# Patient Record
Sex: Male | Born: 1960 | Race: White | Hispanic: No | Marital: Married | Smoking: Current every day smoker
Health system: Southern US, Community
[De-identification: ages and names within clinical notes are randomized; demographics above are authoritative.]

## PROBLEM LIST (undated history)

## (undated) ENCOUNTER — Emergency Department (HOSPITAL_COMMUNITY): Admission: EM | Payer: Self-pay | Source: Home / Self Care

## (undated) DIAGNOSIS — R51 Headache: Secondary | ICD-10-CM

## (undated) DIAGNOSIS — R519 Headache, unspecified: Secondary | ICD-10-CM

## (undated) DIAGNOSIS — I509 Heart failure, unspecified: Secondary | ICD-10-CM

## (undated) DIAGNOSIS — I5031 Acute diastolic (congestive) heart failure: Secondary | ICD-10-CM

## (undated) DIAGNOSIS — I160 Hypertensive urgency: Secondary | ICD-10-CM

## (undated) DIAGNOSIS — M1990 Unspecified osteoarthritis, unspecified site: Secondary | ICD-10-CM

## (undated) DIAGNOSIS — I10 Essential (primary) hypertension: Secondary | ICD-10-CM

## (undated) DIAGNOSIS — I469 Cardiac arrest, cause unspecified: Secondary | ICD-10-CM

## (undated) HISTORY — DX: Hypertensive urgency: I16.0

## (undated) HISTORY — PX: NOSE SURGERY: SHX723

## (undated) HISTORY — DX: Cardiac arrest, cause unspecified: I46.9

## (undated) HISTORY — DX: Unspecified osteoarthritis, unspecified site: M19.90

## (undated) HISTORY — DX: Heart failure, unspecified: I50.9

## (undated) HISTORY — PX: BACK SURGERY: SHX140

## (undated) HISTORY — DX: Acute diastolic (congestive) heart failure: I50.31

---

## 2000-04-01 ENCOUNTER — Emergency Department (HOSPITAL_COMMUNITY): Admission: EM | Admit: 2000-04-01 | Discharge: 2000-04-01 | Payer: Self-pay | Admitting: Emergency Medicine

## 2004-01-03 ENCOUNTER — Emergency Department (HOSPITAL_COMMUNITY): Admission: EM | Admit: 2004-01-03 | Discharge: 2004-01-04 | Payer: Self-pay

## 2004-08-17 ENCOUNTER — Emergency Department (HOSPITAL_COMMUNITY): Admission: EM | Admit: 2004-08-17 | Discharge: 2004-08-18 | Payer: Self-pay | Admitting: *Deleted

## 2004-12-13 ENCOUNTER — Emergency Department (HOSPITAL_COMMUNITY): Admission: EM | Admit: 2004-12-13 | Discharge: 2004-12-13 | Payer: Self-pay | Admitting: Emergency Medicine

## 2012-10-01 ENCOUNTER — Other Ambulatory Visit: Payer: Self-pay | Admitting: Lab

## 2012-10-01 ENCOUNTER — Encounter: Payer: Self-pay | Admitting: Genetic Counselor

## 2012-11-22 ENCOUNTER — Ambulatory Visit (HOSPITAL_BASED_OUTPATIENT_CLINIC_OR_DEPARTMENT_OTHER): Payer: Self-pay | Admitting: Genetic Counselor

## 2012-11-22 ENCOUNTER — Other Ambulatory Visit: Payer: Self-pay | Admitting: Lab

## 2012-11-22 DIAGNOSIS — Z800 Family history of malignant neoplasm of digestive organs: Secondary | ICD-10-CM

## 2012-11-22 DIAGNOSIS — IMO0002 Reserved for concepts with insufficient information to code with codable children: Secondary | ICD-10-CM

## 2012-11-22 DIAGNOSIS — Z8041 Family history of malignant neoplasm of ovary: Secondary | ICD-10-CM

## 2012-11-23 ENCOUNTER — Encounter: Payer: Self-pay | Admitting: Genetic Counselor

## 2012-11-23 NOTE — Progress Notes (Signed)
Michael Bell, a 52 y.o. male, came in for discussion of his family history of colon, ovarian and stomach cancer.  He presents to clinic today with his sister, brother, father and nieces to discuss the possibility of a genetic predisposition to cancer, and to further clarify his risks, as well as his family members' risks for cancer.   HISTORY OF PRESENT ILLNESS: Michael Bell is a 52 y.o. male with no personal history of cancer.  He reports that he has not had a colonoscopy.  History reviewed. No pertinent past medical history.  History reviewed. No pertinent past surgical history.  History   Social History  . Marital Status: Single    Spouse Name: N/A    Number of Children: N/A  . Years of Education: N/A   Social History Main Topics  . Smoking status: Current Every Day Smoker    Types: Cigarettes  . Smokeless tobacco: None  . Alcohol Use: None  . Drug Use: None  . Sexual Activity: None   Other Topics Concern  . None   Social History Narrative  . None    PERSONAL RISK ASSESSMENT FACTORS: Colonoscopy: No  FAMILY HISTORY:  We obtained a detailed, 4-generation family history.  Significant diagnoses are listed below: Family History  Problem Relation Age of Onset  . Ovarian cancer Mother 66    primary peritoneal cancer  . Colon polyps Father     11 on last colonoscopy  . Colon cancer Sister 83    cancer of the sigmoid colon  . COPD Maternal Grandmother   . Stomach cancer Paternal Grandmother   . Down syndrome Paternal Aunt   . Cancer Paternal Uncle     cancer of the spine   Patient's ancestors are of unknown descent. There is no reported Ashkenazi Jewish ancestry. There is no known consanguinity.  GENETIC COUNSELING ASSESSMENT: Michael Bell is a 52 y.o. male with a familyhistory of colon, ovarian and stomach cancer which somewhat suggestive of a hereditary colon cancer syndrome and predisposition to cancer. We, therefore, discussed and recommended the following at  today's visit.   DISCUSSION: We reviewed the characteristics, features and inheritance patterns of hereditary cancer syndromes. We also discussed genetic testing, including the appropriate family members to test, the process of testing, insurance coverage and turn-around-time for results. We discussed that testing his sister would provide the best information for the family. Currently, the family history and ages of onset is most suggestive of Lynch Syndrome. However, his sister's tumor was MSS and IHC negative, suggesting that there is not a mutation in an MMR gene. Therefore, his sister's cancer could be the result of another cancer syndrome or possibly something that we cannot identify at this time. If his sister tests positive for a hereditary colon cancer syndrome, then we can easily test Michael Bell for this mutation. If his sister is negative, then we do not need to do testing at this time. We discussed his need for having a colonoscopy based primarily on his family history and age.   PLAN: After considering the risks, benefits, and limitations, Michael Bell declined testing until after his sister's test results are back. We discussed the implications of a positive, negative and/ or variant of uncertain significance genetic test result. We encouraged Michael Bell to remain in contact with cancer genetics annually so that we can continuously update the family history and inform him of any changes in cancer genetics and testing that may be of benefit for  his family. Michael Bell's questions were answered to his satisfaction today. Our contact information was provided should additional questions or concerns arise.  The patient was seen for a total of 60 minutes, greater than 50% of which was spent face-to-face counseling.  This plan is being carried out per Dr. Feliz Beam recommendations.  This note will also be sent to the referring provider via the electronic medical record. The patient will be supplied  with a summary of this genetic counseling discussion as well as educational information on the discussed hereditary cancer syndromes following the conclusion of their visit.   Patient was discussed with Dr. Drue Second.   _______________________________________________________________________ For Office Staff:  Number of people involved in session: 6 Was an Intern/ student involved with case: no

## 2013-07-19 ENCOUNTER — Ambulatory Visit (INDEPENDENT_AMBULATORY_CARE_PROVIDER_SITE_OTHER): Payer: No Typology Code available for payment source | Admitting: Family Medicine

## 2013-07-19 ENCOUNTER — Encounter: Payer: Self-pay | Admitting: Family Medicine

## 2013-07-19 ENCOUNTER — Ambulatory Visit: Payer: No Typology Code available for payment source

## 2013-07-19 VITALS — BP 165/91 | HR 74 | Temp 98.0°F | Resp 16 | Ht 71.5 in | Wt 241.0 lb

## 2013-07-19 DIAGNOSIS — M25559 Pain in unspecified hip: Secondary | ICD-10-CM

## 2013-07-19 DIAGNOSIS — Z789 Other specified health status: Secondary | ICD-10-CM

## 2013-07-19 DIAGNOSIS — I10 Essential (primary) hypertension: Secondary | ICD-10-CM

## 2013-07-19 DIAGNOSIS — M25551 Pain in right hip: Secondary | ICD-10-CM

## 2013-07-19 DIAGNOSIS — G8929 Other chronic pain: Secondary | ICD-10-CM

## 2013-07-19 DIAGNOSIS — Z9189 Other specified personal risk factors, not elsewhere classified: Secondary | ICD-10-CM

## 2013-07-19 LAB — COMPLETE METABOLIC PANEL WITH GFR
ALBUMIN: 3.9 g/dL (ref 3.5–5.2)
ALT: 24 U/L (ref 0–53)
AST: 21 U/L (ref 0–37)
Alkaline Phosphatase: 77 U/L (ref 39–117)
BILIRUBIN TOTAL: 0.4 mg/dL (ref 0.2–1.2)
BUN: 14 mg/dL (ref 6–23)
CO2: 25 mEq/L (ref 19–32)
Calcium: 9.1 mg/dL (ref 8.4–10.5)
Chloride: 107 mEq/L (ref 96–112)
Creat: 0.88 mg/dL (ref 0.50–1.35)
GFR, Est African American: >89 mL/min
GFR, Est Non African American: >89 mL/min
Glucose, Bld: 96 mg/dL (ref 70–99)
POTASSIUM: 4.3 meq/L (ref 3.5–5.3)
SODIUM: 140 meq/L (ref 135–145)
Total Protein: 7.1 g/dL (ref 6.0–8.3)

## 2013-07-19 MED ORDER — METOPROLOL SUCCINATE ER 50 MG PO TB24: ORAL_TABLET | ORAL | Status: DC

## 2013-07-19 MED ORDER — TRAMADOL HCL 50 MG PO TABS: 50.0000 mg | ORAL_TABLET | Freq: Three times a day (TID) | ORAL | Status: DC | PRN

## 2013-07-19 MED ORDER — GABAPENTIN 100 MG PO CAPS: 100.0000 mg | ORAL_CAPSULE | Freq: Three times a day (TID) | ORAL | Status: DC

## 2013-07-19 MED ORDER — AMLODIPINE BESYLATE 10 MG PO TABS: 10.0000 mg | ORAL_TABLET | Freq: Every day | ORAL | Status: DC

## 2013-07-19 MED ORDER — LISINOPRIL-HYDROCHLOROTHIAZIDE 20-12.5 MG PO TABS: ORAL_TABLET | ORAL | Status: DC
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
## 2013-07-19 NOTE — Patient Instructions (Signed)
PAIN MEDICATIONS: Tramadol can be taken 1 every 8 hours as needed for pain. It may make you sleepy.   GABAPENTIN: Start with 1 capsule at bedtime for 3 nights then increase to 2 capsules at bedtime for 1 week. Increase to 3 capsules at bedtime until next visit with me. The orthopedist may tweak this dosing a little.   Decrease caffeine intake; that will help your blood pressure and your bone health.

## 2013-07-20 LAB — THYROID PANEL WITH TSH
FREE THYROXINE INDEX: 2.7 (ref 1.0–3.9)
T3 Uptake: 28.5 % (ref 22.5–37.0)
T4 TOTAL: 9.6 ug/dL (ref 5.0–12.5)
TSH: 0.591 u[IU]/mL (ref 0.350–4.500)

## 2013-07-20 LAB — SEDIMENTATION RATE: SED RATE: 4 mm/h (ref 0–16)

## 2013-07-20 NOTE — Progress Notes (Signed)
Quick Note:  Please notify pt that results are normal.   Provide pt with copy of labs. ______ 

## 2013-07-22 ENCOUNTER — Telehealth: Payer: Self-pay

## 2013-07-22 ENCOUNTER — Encounter: Payer: Self-pay | Admitting: *Deleted

## 2013-07-22 DIAGNOSIS — Z720 Tobacco use: Secondary | ICD-10-CM | POA: Insufficient documentation

## 2013-07-22 DIAGNOSIS — G8929 Other chronic pain: Secondary | ICD-10-CM | POA: Insufficient documentation

## 2013-07-22 DIAGNOSIS — M25551 Pain in right hip: Secondary | ICD-10-CM

## 2013-07-22 DIAGNOSIS — I10 Essential (primary) hypertension: Secondary | ICD-10-CM | POA: Insufficient documentation

## 2013-07-22 MED ORDER — METOPROLOL SUCCINATE ER 50 MG PO TB24: ORAL_TABLET | ORAL | Status: DC

## 2013-07-22 NOTE — Telephone Encounter (Signed)
Fax from pharm advising that usual dose of metoprolol ER is just QD, ins will not pay for BID dosing of the ER. Dr Audria NineMcPherson, do you want to change to imm release metoprolol or to metoprolol dosed just once a day?

## 2013-07-22 NOTE — Telephone Encounter (Signed)
I phoned the pharmacy and changed frequency to Metoprolol succinate 50 mg  24 hr tablet   1 tablet daily. (Pt had a bottle from previous prescriber that had 1 tab bid on label). Notify pt of the change on that medication.

## 2013-07-23 NOTE — Telephone Encounter (Signed)
Spoke to patient advised him to take his metoprolol once a day per Dr. Audria NineMcPherson.  He said he would do so.

## 2013-10-07 ENCOUNTER — Emergency Department (HOSPITAL_COMMUNITY): Payer: No Typology Code available for payment source

## 2013-10-07 ENCOUNTER — Emergency Department (HOSPITAL_COMMUNITY)
Admission: EM | Admit: 2013-10-07 | Discharge: 2013-10-07 | Disposition: A | Discharge status: Home / Self Care | Payer: No Typology Code available for payment source | Attending: Emergency Medicine | Admitting: Emergency Medicine

## 2013-10-07 ENCOUNTER — Encounter (HOSPITAL_COMMUNITY): Payer: Self-pay | Admitting: Emergency Medicine

## 2013-10-07 DIAGNOSIS — R0602 Shortness of breath: Secondary | ICD-10-CM | POA: Insufficient documentation

## 2013-10-07 DIAGNOSIS — I10 Essential (primary) hypertension: Secondary | ICD-10-CM | POA: Insufficient documentation

## 2013-10-07 DIAGNOSIS — Z79899 Other long term (current) drug therapy: Secondary | ICD-10-CM | POA: Insufficient documentation

## 2013-10-07 DIAGNOSIS — R0789 Other chest pain: Secondary | ICD-10-CM | POA: Insufficient documentation

## 2013-10-07 DIAGNOSIS — F17200 Nicotine dependence, unspecified, uncomplicated: Secondary | ICD-10-CM | POA: Insufficient documentation

## 2013-10-07 DIAGNOSIS — R079 Chest pain, unspecified: Secondary | ICD-10-CM | POA: Diagnosis present

## 2013-10-07 DIAGNOSIS — M129 Arthropathy, unspecified: Secondary | ICD-10-CM | POA: Insufficient documentation

## 2013-10-07 HISTORY — DX: Essential (primary) hypertension: I10

## 2013-10-07 LAB — BASIC METABOLIC PANEL
ANION GAP: 13 (ref 5–15)
BUN: 12 mg/dL (ref 6–23)
CALCIUM: 9.2 mg/dL (ref 8.4–10.5)
CO2: 24 meq/L (ref 19–32)
Chloride: 97 mEq/L (ref 96–112)
Creatinine, Ser: 0.81 mg/dL (ref 0.50–1.35)
GFR calc non Af Amer: >90 mL/min (ref >90)
Glucose, Bld: 115 mg/dL — ABNORMAL HIGH (ref 70–99)
Potassium: 3.8 mEq/L (ref 3.7–5.3)
SODIUM: 134 meq/L — AB (ref 137–147)

## 2013-10-07 LAB — CBC
HCT: 41.2 % (ref 39.0–52.0)
Hemoglobin: 14.2 g/dL (ref 13.0–17.0)
MCH: 28.2 pg (ref 26.0–34.0)
MCHC: 34.5 g/dL (ref 30.0–36.0)
MCV: 81.7 fL (ref 78.0–100.0)
PLATELETS: 215 10*3/uL (ref 150–400)
RBC: 5.04 MIL/uL (ref 4.22–5.81)
RDW: 13.9 % (ref 11.5–15.5)
WBC: 10.6 10*3/uL — ABNORMAL HIGH (ref 4.0–10.5)

## 2013-10-07 LAB — I-STAT TROPONIN, ED: TROPONIN I, POC: 0 ng/mL (ref 0.00–0.08)

## 2013-10-07 LAB — TROPONIN I

## 2013-10-07 MED ORDER — SODIUM CHLORIDE 0.9 % IV BOLUS (SEPSIS): 500.0000 mL | Freq: Once | INTRAVENOUS | Status: DC

## 2013-10-07 MED ORDER — SODIUM CHLORIDE 0.9 % IV BOLUS (SEPSIS)
1000.0000 mL | INTRAVENOUS | Status: AC
  Administered 2013-10-07: 1000 mL via INTRAVENOUS

## 2013-10-07 MED ORDER — DIPHENHYDRAMINE HCL 50 MG/ML IJ SOLN
12.5000 mg | Freq: Once | INTRAMUSCULAR | Status: AC
  Administered 2013-10-07: 12.5 mg via INTRAVENOUS
  Filled 2013-10-07: qty 1

## 2013-10-07 MED ORDER — METOCLOPRAMIDE HCL 5 MG/ML IJ SOLN
5.0000 mg | Freq: Once | INTRAMUSCULAR | Status: AC
  Administered 2013-10-07: 5 mg via INTRAVENOUS
  Filled 2013-10-07: qty 2

## 2013-10-07 MED ORDER — ASPIRIN 325 MG PO TABS
325.0000 mg | ORAL_TABLET | ORAL | Status: AC
  Administered 2013-10-07: 325 mg via ORAL
  Filled 2013-10-07: qty 1

## 2013-10-07 NOTE — ED Notes (Signed)
Patient requesting to leave AMA. Dr. Romeo AppleHarrison at bedside speaking to Michael Bell. Michael Bell sts is willing to stay for second troponin to be drawn. Michael Bell sitting up on edge of bed. Denies chair, or drinks or snacks.

## 2013-10-07 NOTE — ED Notes (Signed)
Patient states his L arm was hurting last night most of the night.  Patient states he didn't sleep last night.  This morning around 0600, he started having central chest pain with no radiation.  Patient states that he did have nausea, diaphoresis and lightheadedness at that time.   Patient states that the chest pain has eased off at this time, but that he is having L arm pain.

## 2013-10-07 NOTE — ED Provider Notes (Signed)
CSN: [id number]     Arrival date & time 10/07/13  [id number] History   First MD Initiated Contact with Patient 10/07/13 0919     Chief Complaint  Patient presents with  . Chest Pain     (Consider location/radiation/quality/duration/timing/severity/associated sxs/prior Treatment) HPI Comments: 53 year old male who presents with chest pain. He states that he developed left arm sharp pain which began around midnight last night. He states that his pain was constant and unwavering in the left arm throughout the night. Around 6 AM he developed central sharp chest pain for approximately 30 minutes to one hour. He states that the chest pain then resolved spontaneously. He also noted some mild shortness of breath with exertion at that time. He currently complains of 2/10 left arm pain. He denies any chest pain on exam currently. He has had similar symptoms several years ago. He denies any other associated symptoms. He took his blood pressure medicine but did not take any pain medicines.  Patient is a 53 y.o. male presenting with chest pain. The history is provided by the patient.  Chest Pain Pain location:  Substernal area Pain quality: sharp   Pain radiates to:  Does not radiate Pain radiates to the back: no   Pain severity:  Mild Onset quality:  Sudden Duration:  1 hour Timing:  Sporadic Progression:  Resolved Chronicity:  New Context: at rest   Relieved by:  Nothing Worsened by:  Nothing tried Ineffective treatments:  None tried Associated symptoms: shortness of breath (mild w/ exertion)   Associated symptoms: no abdominal pain, no cough, no fever, no headache, no nausea, no numbness and not vomiting     Past Medical History  Diagnosis Date  . Arthritis   . Hypertension    History reviewed. No pertinent past surgical history. Family History  Problem Relation Age of Onset  . Ovarian cancer Mother [id number]    primary peritoneal cancer  . Cancer Mother   . Diabetes Mother   . Heart disease  Mother   . Hyperlipidemia Mother   . Hypertension Mother   . Stroke Mother   . Mental illness Mother   . Colon polyps Father     11 on last colonoscopy  . Heart disease Father   . Hyperlipidemia Father   . Hypertension Father   . Stroke Father   . Colon cancer Sister [id number]    cancer of the sigmoid colon  . COPD Maternal Grandmother   . Stomach cancer Paternal Grandmother   . Down syndrome Paternal Aunt   . Cancer Paternal Uncle     cancer of the spine  . Heart disease Brother   . Hypertension Brother   . Heart disease Brother   . Hyperlipidemia Brother   . Hypertension Brother   . Cancer Brother   . Heart disease Brother   . Mental illness Brother   . Hypertension Brother    History  Substance Use Topics  . Smoking status: Current Every Day Smoker -- 1.50 packs/day    Types: Cigarettes  . Smokeless tobacco: Not on file  . Alcohol Use: Not on file    Review of Systems  Constitutional: Negative for fever.  HENT: Negative for drooling and rhinorrhea.   Eyes: Negative for pain.  Respiratory: Positive for shortness of breath (mild w/ exertion). Negative for cough.   Cardiovascular: Positive for chest pain. Negative for leg swelling.  Gastrointestinal: Negative for nausea, vomiting, abdominal pain and diarrhea.  Genitourinary: Negative for dysuria and hematuria.  Musculoskeletal:  Negative for gait problem and neck pain.  Skin: Negative for color change.  Neurological: Negative for numbness and headaches.  Hematological: Negative for adenopathy.  Psychiatric/Behavioral: Negative for behavioral problems.  All other systems reviewed and are negative.     Allergies  Review of patient's allergies indicates no known allergies.  Home Medications   Prior to Admission medications   Medication Sig Start Date End Date Taking? Authorizing Provider  amLODipine (NORVASC) 10 MG tablet Take 1 tablet (10 mg total) by mouth daily. 07/19/13   Maurice March, MD   BP 119/83   Pulse 71  Temp(Src) 97.7 F (36.5 C) (Oral)  Resp 20  SpO2 97% Physical Exam  Nursing note and vitals reviewed. Constitutional: He is oriented to person, place, and time. He appears well-developed and well-nourished.  HENT:  Head: Normocephalic and atraumatic.  Right Ear: External ear normal.  Left Ear: External ear normal.  Nose: Nose normal.  Mouth/Throat: Oropharynx is clear and moist. No oropharyngeal exudate.  Eyes: Conjunctivae and EOM are normal. Pupils are equal, round, and reactive to light.  Neck: Normal range of motion. Neck supple.  Cardiovascular: Normal rate, regular rhythm, normal heart sounds and intact distal pulses.  Exam reveals no gallop and no friction rub.   No murmur heard. Pulmonary/Chest: Effort normal and breath sounds normal. No respiratory distress. He has no wheezes.  Abdominal: Soft. Bowel sounds are normal. He exhibits no distension. There is no tenderness. There is no rebound and no guarding.  Musculoskeletal: Normal range of motion. He exhibits no edema and no tenderness.  Mild tenderness to palpation of the medial aspect of the left upper arm.  Neurological: He is alert and oriented to person, place, and time.  Skin: Skin is warm and dry.  Psychiatric: He has a normal mood and affect. His behavior is normal.    ED Course  Procedures (including critical care time) Labs Review Labs Reviewed  CBC - Abnormal; Notable for the following:    WBC 10.6 (*)    All other components within normal limits  BASIC METABOLIC PANEL - Abnormal; Notable for the following:    Sodium 134 (*)    Glucose, Bld 115 (*)    All other components within normal limits  TROPONIN I  Rosezena Sensor, ED    Imaging Review Dg Chest 2 View  10/07/2013   CLINICAL DATA:  53 year old male with chest pain.  EXAM: CHEST  2 VIEW  COMPARISON:  None.  FINDINGS: The cardiomediastinal silhouette is unremarkable.  Mild peribronchial thickening identified.  There is no evidence of focal  airspace disease, pulmonary edema, suspicious pulmonary nodule/mass, pleural effusion, or pneumothorax. No acute bony abnormalities are identified.  IMPRESSION: No active cardiopulmonary disease.   Electronically Signed   By: Laveda Abbe M.D.   On: 10/07/2013 09:38     EKG Interpretation   Date/Time:  Monday October 07 2013 09:03:24 EDT Ventricular Rate:  71 PR Interval:  166 QRS Duration: 100 QT Interval:  398 QTC Calculation: 432 R Axis:   7 Text Interpretation:  Normal sinus rhythm Normal ECG No significant change  since last tracing Confirmed by Dudley Mages  MD, Deric Bocock ([id number]) on 10/07/2013  9:26:09 AM      MDM   Final diagnoses:  Chest pain at rest    9:47 AM 53 y.o. male w hx of HTN, smoker, FH of heart dis who pw left arm and cp. Currently no cp, only left arm pain 2/10. AFVSS here. ECG non-contrib. Story  atypical as pain started at rest, sharp, initially only in left arm for hours. Not pleuritic in nature, no sob on exam. Will give ASA, get labs/imaging.   Pt w/ mild HA. Will treat w/ HA cocktail. He is upset about the wait time and would like to leave. I convinced him to stay for a delta trop.   1:17 PM: Delta trop neg. HA improved. Pt wishes to go home. Low risk for MACE per HEART score.  I have discussed the diagnosis/risks/treatment options with the patient and believe the pt to be eligible for discharge home to follow-up with pcp tomorrow to discuss further heart work up. We also discussed returning to the ED immediately if new or worsening sx occur. We discussed the sx which are most concerning (e.g., return of cp, sob) that necessitate immediate return. Medications administered to the patient during their visit and any new prescriptions provided to the patient are listed below.  Medications given during this visit Medications  aspirin tablet 325 mg (325 mg Oral Given 10/07/13 1002)  metoCLOPramide (REGLAN) injection 5 mg (5 mg Intravenous Given 10/07/13 1150)  diphenhydrAMINE  (BENADRYL) injection 12.5 mg (12.5 mg Intravenous Given 10/07/13 1149)  sodium chloride 0.9 % bolus 1,000 mL (1,000 mLs Intravenous New Bag/Given 10/07/13 1152)    New Prescriptions   No medications on file     Junius Argyle, MD 10/07/13 1606

## 2013-10-07 NOTE — ED Notes (Signed)
Patient transported to X-ray 

## 2013-10-07 NOTE — Discharge Instructions (Signed)

## 2013-10-30 ENCOUNTER — Ambulatory Visit (INDEPENDENT_AMBULATORY_CARE_PROVIDER_SITE_OTHER): Payer: No Typology Code available for payment source | Admitting: Family Medicine

## 2013-10-30 ENCOUNTER — Encounter: Payer: Self-pay | Admitting: Family Medicine

## 2013-10-30 VITALS — BP 124/79 | HR 66 | Temp 98.5°F | Resp 16 | Ht 71.75 in | Wt 239.6 lb

## 2013-10-30 DIAGNOSIS — I10 Essential (primary) hypertension: Secondary | ICD-10-CM

## 2013-10-30 NOTE — Progress Notes (Signed)
S:  This 53 y.o. Cauc male has well controlled HTN and is compliant w/ medications. He reports taking Lisinopril- HCTZ once a day (not bid as prescribed). His BP is controlled and he denies fatigue, vision disturbances, CP or tightness, palpitations, HA, dizziness, numbness, weakness or syncope. His is seeing a Pain and Rehab Specialist (Dr. 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 Pt reports little relief w/ oxycodone. 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  Pt's wife is being seen today; she reports that pt has hx of bipolar disorder but refuses to take medication. He is abstaining from alcohol use. There is occasional marital discord due to this mental health disorder.  Patient Active Problem List   Diagnosis Date Noted  . Chest pain at rest 10/07/2013  . HTN, goal below 140/80 07/22/2013  . Chronic right hip pain 07/22/2013  . Tobacco user 07/22/2013    Outpatient Encounter Prescriptions as of 10/30/2013  Medication Sig  . amLODipine (NORVASC) 10 MG tablet Take 1 tablet (10 mg total) by mouth daily.  Marland Kitchen. gabapentin (NEURONTIN) 100 MG capsule Take 100 mg by mouth 3 (three) times daily as needed (for pain).  Marland Kitchen. lisinopril-hydrochlorothiazide (PRINZIDE,ZESTORETIC) 20-12.5 MG per tablet Take 1 tablet by mouth 2 (two) times daily.  . metoprolol succinate (TOPROL-XL) 50 MG 24 hr tablet Take 50 mg by mouth daily. Take with or immediately following a meal.  . oxyCODONE (OXY IR/ROXICODONE) 5 MG immediate release tablet Take 5 mg by mouth every 4 (four) hours as needed for moderate pain.   . Aspirin-Acetaminophen-Caffeine (GOODY HEADACHE PO) Take 1 packet by mouth every 6 (six) hours as needed (for pain).    No Known Allergies   PMHx,Surg Hx, Soc and Fam Hx reviewed.  O: Filed Vitals:   10/30/13 0834  BP: 124/79  Pulse: 66  Temp: 98.5 F (36.9 C)  Resp: 16   GEN: in NAD: WN,WD. HENT: [place]/AT; EOMI w/ clear  conj/sclerae. Otherwise unremarkable. COR: RRR. LUNGS: Unlabored resp. SKIN: W&D; intact w/o diapghoresis or erythema. NEURO: A&O x 3; CNs intact. Nonfocal.  A/P: HTN, goal below 140/80- Stable and controlled on current medications; continue taking Lisinopril- HCTZ 20-12.5 mg once a day along with Amlodipine 10 mg once daily and Metoprolol succinate  50 mg 1 tab daily.  Follow up with pain management as scheduled.

## 2013-10-30 NOTE — Patient Instructions (Signed)
GABAPENTIN for back pain-  Continue to take 1 capsule every morning and 1 capsule at midday; take 2 capsules at bedtime for 1 week.  Increase bedtime dose to 3 capsules at bedtime ( your total daily intake 5 capsules daily: 1 in the morning, 1 at midday and 3 at bedtime). This should give you some pain relief while the Pain Specialist works on a plan for pain control.  I will see you in 3 months for complete physical and fasting labs.   Insomnia Insomnia is frequent trouble falling and/or staying asleep. Insomnia can be a long term problem or a short term problem. Both are common. Insomnia can be a short term problem when the wakefulness is related to a certain stress or worry. Long term insomnia is often related to ongoing stress during waking hours and/or poor sleeping habits. Overtime, sleep deprivation itself can make the problem worse. Every little thing feels more severe because you are overtired and your ability to cope is decreased. CAUSES   Stress, anxiety, and depression.  Poor sleeping habits.  Distractions such as TV in the bedroom.  Naps close to bedtime.  Engaging in emotionally charged conversations before bed.  Technical reading before sleep.  Alcohol and other sedatives. They may make the problem worse. They can hurt normal sleep patterns and normal dream activity.  Stimulants such as caffeine for several hours prior to bedtime.  Pain syndromes and shortness of breath can cause insomnia.  Exercise late at night.  Changing time zones may cause sleeping problems (jet lag). It is sometimes helpful to have someone observe your sleeping patterns. They should look for periods of not breathing during the night (sleep apnea). They should also look to see how long those periods last. If you live alone or observers are uncertain, you can also be observed at a sleep clinic where your sleep patterns will be professionally monitored. Sleep apnea requires a checkup and treatment.  Give your caregivers your medical history. Give your caregivers observations your family has made about your sleep.  SYMPTOMS   Not feeling rested in the morning.  Anxiety and restlessness at bedtime.  Difficulty falling and staying asleep. TREATMENT   Your caregiver may prescribe treatment for an underlying medical disorders. Your caregiver can give advice or help if you are using alcohol or other drugs for self-medication. Treatment of underlying problems will usually eliminate insomnia problems.  Medications can be prescribed for short time use. They are generally not recommended for lengthy use.  Over-the-counter sleep medicines are not recommended for lengthy use. They can be habit forming.  You can promote easier sleeping by making lifestyle changes such as:  Using relaxation techniques that help with breathing and reduce muscle tension.  Exercising earlier in the day.  Changing your diet and the time of your last meal. No night time snacks.  Establish a regular time to go to bed.  Counseling can help with stressful problems and worry.  Soothing music and white noise may be helpful if there are background noises you cannot remove.  Stop tedious detailed work at least one hour before bedtime. HOME CARE INSTRUCTIONS   Keep a diary. Inform your caregiver about your progress. This includes any medication side effects. See your caregiver regularly. Take note of:  Times when you are asleep.  Times when you are awake during the night.  The quality of your sleep.  How you feel the next day. This information will help your caregiver care for you.  Get  out of bed if you are still awake after 15 minutes. Read or do some quiet activity. Keep the lights down. Wait until you feel sleepy and go back to bed.  Keep regular sleeping and waking hours. Avoid naps.  Exercise regularly.  Avoid distractions at bedtime. Distractions include watching television or engaging in any  intense or detailed activity like attempting to balance the household checkbook.  Develop a bedtime ritual. Keep a familiar routine of bathing, brushing your teeth, climbing into bed at the same time each night, listening to soothing music. Routines increase the success of falling to sleep faster.  Use relaxation techniques. This can be using breathing and muscle tension release routines. It can also include visualizing peaceful scenes. You can also help control troubling or intruding thoughts by keeping your mind occupied with boring or repetitive thoughts like the old concept of counting sheep. You can make it more creative like imagining planting one beautiful flower after another in your backyard garden.  During your day, work to eliminate stress. When this is not possible use some of the previous suggestions to help reduce the anxiety that accompanies stressful situations. MAKE SURE YOU:   Understand these instructions.  Will watch your condition.  Will get help right away if you are not doing well or get worse. Document Released: 03/11/2000 Document Revised: 06/06/2011 Document Reviewed: 04/11/2007 [facility] Patient Information 2015 [place], [place]. This information is not intended to replace advice given to you by your health care provider. Make sure you discuss any questions you have with your health care provider.

## 2013-12-18 ENCOUNTER — Other Ambulatory Visit (HOSPITAL_COMMUNITY): Payer: Self-pay | Admitting: Specialist

## 2013-12-26 NOTE — Pre-Procedure Instructions (Addendum)
Michael Bell  12/26/2013   Your procedure is scheduled on: Friday, January 03, 2014  Report to [facility]" [address] at 10:30 AM.  Call this number if you have problems the morning of surgery: [phone]   Remember:   Do not eat food or drink liquids after midnight.   Take these medicines the morning of surgery with A SIP OF WATER: amlodipine (Norvasc), gabapentin (Neurontin), metoprolol (Toprol XL), and oxycodone (if needed)  STOP taking Aspirin, Goody's, BC's, Aleve (Naproxen), Ibuprofen (Advil or Motrin), Fish Oil, Vitamins, Herbal Supplements or any substance that could thin your blood starting today, Friday, December 30 2013   Do not wear jewelry.  Do not wear lotions, powders, or colognes. You may wear deodorant.  Do not shave 48 hours prior to surgery. Men may shave face and neck.  Do not bring valuables to the hospital.  [facility] is not responsible                  for any belongings or valuables.               Contacts, dentures or bridgework may not be worn into surgery.  Leave suitcase in the car. After surgery it may be brought to your room.  For patients admitted to the hospital, discharge time is determined by your                treatment team.               Patients discharged the day of surgery will not be allowed to drive  home.    Special Instructions:  Special Instructions: [facility] - Preparing for Surgery  Before surgery, you can play an important role.  Because skin is not sterile, your skin needs to be as free of germs as possible.  You can reduce the number of germs on you skin by washing with CHG (chlorahexidine gluconate) soap before surgery.  CHG is an antiseptic cleaner which kills germs and bonds with the skin to continue killing germs even after washing.  Please DO NOT use if you have an allergy to CHG or antibacterial soaps.  If your skin becomes reddened/irritated stop using the CHG and inform your nurse  when you arrive at Short Stay.  Do not shave (including legs and underarms) for at least 48 hours prior to the first CHG shower.  You may shave your face.  Please follow these instructions carefully:   1.  Shower with CHG Soap the night before surgery and the morning of Surgery.  2.  If you choose to wash your hair, wash your hair first as usual with your normal shampoo.  3.  After you shampoo, rinse your hair and body thoroughly to remove the Shampoo.  4.  Use CHG as you would any other liquid soap.  You can apply chg directly  to the skin and wash gently with scrungie or a clean washcloth.  5.  Apply the CHG Soap to your body ONLY FROM THE NECK DOWN.  Do not use on open wounds or open sores.  Avoid contact with your eyes ears, mouth and genitals (private parts).  Wash genitals (private parts)       with your normal soap.  6.  Wash thoroughly, paying special attention to the area where your surgery will be performed.  7.  Thoroughly rinse your body with warm water from the neck down.  8.  DO NOT shower/wash with your normal soap after using and rinsing off the CHG Soap.  9.  Pat yourself dry with a clean towel.            10.  Wear clean pajamas.            11.  Place clean sheets on your bed the night of your first shower and do not sleep with pets.  Day of Surgery  Do not apply any lotions/deodorants the morning of surgery.  Please wear clean clothes to the hospital/surgery center..   Please read over the following fact sheets that you were given: Pain Booklet, Coughing and Deep Breathing, MRSA Information and Surgical Site Infection Prevention

## 2013-12-27 ENCOUNTER — Encounter (HOSPITAL_COMMUNITY): Payer: Self-pay

## 2013-12-27 ENCOUNTER — Encounter (HOSPITAL_COMMUNITY)
Admission: RE | Admit: 2013-12-27 | Discharge: 2013-12-27 | Disposition: A | Discharge status: Home / Self Care | Payer: No Typology Code available for payment source | Source: Ambulatory Visit | Attending: Specialist | Admitting: Specialist

## 2013-12-27 DIAGNOSIS — M5116 Intervertebral disc disorders with radiculopathy, lumbar region: Secondary | ICD-10-CM | POA: Diagnosis present

## 2013-12-27 DIAGNOSIS — Z01812 Encounter for preprocedural laboratory examination: Secondary | ICD-10-CM | POA: Insufficient documentation

## 2013-12-27 HISTORY — DX: Headache: R51

## 2013-12-27 HISTORY — DX: Headache, unspecified: R51.9

## 2013-12-27 LAB — COMPREHENSIVE METABOLIC PANEL
ALBUMIN: 3.7 g/dL (ref 3.5–5.2)
ALT: 22 U/L (ref 0–53)
ANION GAP: 12 (ref 5–15)
AST: 16 U/L (ref 0–37)
Alkaline Phosphatase: 85 U/L (ref 39–117)
BUN: 15 mg/dL (ref 6–23)
CALCIUM: 9.4 mg/dL (ref 8.4–10.5)
CO2: 23 mEq/L (ref 19–32)
CREATININE: 1.09 mg/dL (ref 0.50–1.35)
Chloride: 101 mEq/L (ref 96–112)
GFR calc Af Amer: 88 mL/min — ABNORMAL LOW (ref >90)
GFR calc non Af Amer: 76 mL/min — ABNORMAL LOW (ref >90)
Glucose, Bld: 108 mg/dL — ABNORMAL HIGH (ref 70–99)
Potassium: 4.3 mEq/L (ref 3.7–5.3)
Sodium: 136 mEq/L — ABNORMAL LOW (ref 137–147)
Total Bilirubin: 0.3 mg/dL (ref 0.3–1.2)
Total Protein: 7.7 g/dL (ref 6.0–8.3)

## 2013-12-27 LAB — CBC
HEMATOCRIT: 42.6 % (ref 39.0–52.0)
Hemoglobin: 14.4 g/dL (ref 13.0–17.0)
MCH: 28.6 pg (ref 26.0–34.0)
MCHC: 33.8 g/dL (ref 30.0–36.0)
MCV: 84.5 fL (ref 78.0–100.0)
PLATELETS: 226 10*3/uL (ref 150–400)
RBC: 5.04 MIL/uL (ref 4.22–5.81)
RDW: 13.3 % (ref 11.5–15.5)
WBC: 10 10*3/uL (ref 4.0–10.5)

## 2013-12-27 LAB — SURGICAL PCR SCREEN
MRSA, PCR: NEGATIVE
Staphylococcus aureus: NEGATIVE

## 2013-12-27 NOTE — Progress Notes (Signed)
12/27/13 0957  OBSTRUCTIVE SLEEP APNEA  Have you ever been diagnosed with sleep apnea through a sleep study? No  Do you snore loudly (loud enough to be heard through closed doors)?  0  Do you often feel tired, fatigued, or sleepy during the daytime? 0  Has anyone observed you stop breathing during your sleep? 0  Do you have, or are you being treated for high blood pressure? 1  BMI more than 35 kg/m2? 0  Age over 459 years old? 1  Neck circumference greater than 40 cm/16 inches? 1 (17)  Gender: 1  Obstructive Sleep Apnea Score 4  Score 4 or greater  Results sent to PCP

## 2014-01-01 NOTE — H&P (Signed)
Michael JumboJohn F Bell is an 53 y.o. male.   Chief Complaint: right leg pain and weakness. HPI:   He has been treated for problems of lateral disk protrusion on the right side L4-5, some mild lateral recess stenosis at L3-4.  Pain is worse with standing and ambulation.  He has difficulty ambulating stairs due to weakness he is experiencing in his right thigh.  The pain is worse when he tries to stand and walk, improves with sitting and lying down.  No bowel or bladder symptoms.  The pain is primarily over the right anterior thigh.  It extends down along the anterior aspect of the right shin.  He has pain that is severe, and he has been taking narcotic medicine in the form of oxycodone the last month and a half to 2 months.    This patient has persistent lumbar radiculopathy into the right lower extremity in an L4 distribution, and his MRI findings are consistent with foraminal entrapment affecting the right L4 nerve root.  He is not seeing improvement beyond a few hours with epidural steroid injection.  This patient has undergone conservative management for a lumbar disk protrusion that is foraminal and extraforaminal on the right side compressing the right L4 nerve root.  Findings are consistent with right-sided L4 radiculopathy.  He is continuing on medications to try to relieve his discomfort, and these are not effectively improving his pain pattern.   Discussion had with Michael RuizJohn regarding risks and benefits of undergoing intervention surgically in the form of a right-sided extraforaminal approach to decompression of the right L4 nerve root.  Risks of infection, bleeding, and neurologic compromise discussed with Michael Bell.  He has been treated actively for this process since April of this year, a period of nearly 5 months, and has not seen significant improvement.  He continues to require narcotic medicines and has night pain, difficulties with activities, unable to ambulate any distance greater than 100 to 150 feet.  Past  Medical History  Diagnosis Date  . Arthritis   . Hypertension   . Headache     Past Surgical History  Procedure Laterality Date  . Nose surgery      40 yrs ago    Family History  Problem Relation Age of Onset  . Ovarian cancer Mother [id number]    primary peritoneal cancer  . Cancer Mother   . Diabetes Mother   . Heart disease Mother   . Hyperlipidemia Mother   . Hypertension Mother   . Stroke Mother   . Mental illness Mother   . Colon polyps Father     11 on last colonoscopy  . Heart disease Father   . Hyperlipidemia Father   . Hypertension Father   . Stroke Father   . Colon cancer Sister [id number]    cancer of the sigmoid colon  . COPD Maternal Grandmother   . Stomach cancer Paternal Grandmother   . Down syndrome Paternal Aunt   . Cancer Paternal Uncle     cancer of the spine  . Heart disease Brother   . Hypertension Brother   . Heart disease Brother   . Hyperlipidemia Brother   . Hypertension Brother   . Cancer Brother   . Heart disease Brother   . Mental illness Brother   . Hypertension Brother    Social History:  reports that he has been smoking Cigarettes.  He has a 76 pack-year smoking history. He does not have any smokeless tobacco history on file. He reports that  he uses illicit drugs (Marijuana). He reports that he does not drink alcohol.  Allergies: No Known Allergies  Medications Prior to Admission  Medication Sig Dispense Refill  . amLODipine (NORVASC) 10 MG tablet Take 1 tablet (10 mg total) by mouth daily.  30 tablet  3  . Aspirin-Acetaminophen-Caffeine (GOODY HEADACHE PO) Take 1 packet by mouth every 6 (six) hours as needed (for pain).      Marland Kitchen gabapentin (NEURONTIN) 100 MG capsule Take 100 mg by mouth 3 (three) times daily as needed (for pain).      Marland Kitchen lisinopril-hydrochlorothiazide (PRINZIDE,ZESTORETIC) 20-12.5 MG per tablet Take 1 tablet by mouth 2 (two) times daily.      . metoprolol succinate (TOPROL-XL) 50 MG 24 hr tablet Take 50 mg by mouth daily. Take  with or immediately following a meal.      . Oxycodone HCl 10 MG TABS Take 10 mg by mouth every 4 (four) hours as needed (for pain).        No results found for this or any previous visit (from the past 48 hour(s)). No results found.  Review of Systems  Constitutional: Negative.   HENT: Negative.   Eyes: Negative.   Respiratory: Negative.   Cardiovascular: Negative.   Gastrointestinal: Negative.   Genitourinary: Negative.   Musculoskeletal: Positive for back pain.  Skin: Negative.   Neurological: Positive for tingling and focal weakness.       Right leg  Endo/Heme/Allergies: Negative.   Psychiatric/Behavioral: Negative.     Blood pressure 125/86, pulse 65, temperature 97.9 F (36.6 C), temperature source Oral, resp. rate 19, height 6' (1.829 m), weight 110.763 kg (244 lb 3 oz), SpO2 97.00%. Physical Exam  Constitutional: He is oriented to person, place, and time. He appears well-developed and well-nourished.  HENT:  Head: Normocephalic and atraumatic.  Eyes: EOM are normal. Pupils are equal, round, and reactive to light.  Neck: Normal range of motion. Neck supple.  Cardiovascular: Normal rate, regular rhythm and normal heart sounds.   Respiratory: Effort normal and breath sounds normal.  GI: Soft.  Musculoskeletal:   Forward bending fingertips to about the upper third of his knees.  Unable to fully extend his back.  Decreased lordosis of the lumbar spine.  Reflexes at the knee are 1+ and symmetric, at the ankle 1+ and symmetric.  Sciatic tension tests are unremarkable.  Popliteal compression sign is negative.  Reverse straight leg raise is positive on the right.  Foot dorsiflexion strength is 5-/5 on the right.  Knee extension strength 5-/5 on the right.  Hip abduction/adduction and hip flexion testing normal bilaterally.  Left lower extremity motor is normal.  Neurological: He is alert and oriented to person, place, and time.  Skin: Skin is warm and dry.  Psychiatric: He has a  normal mood and affect.     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 PLAN:  Right extraforaminal approach to excision of HNP right L4-5  NITKA,JAMES E 01/03/2014, 12:34 PM

## 2014-01-02 MED ORDER — CEFAZOLIN SODIUM-DEXTROSE 2-3 GM-% IV SOLR
2.0000 g | INTRAVENOUS | Status: AC
  Administered 2014-01-03: 2 g via INTRAVENOUS
  Filled 2014-01-02: qty 50

## 2014-01-03 ENCOUNTER — Encounter (HOSPITAL_COMMUNITY): Payer: Self-pay

## 2014-01-03 ENCOUNTER — Inpatient Hospital Stay (HOSPITAL_COMMUNITY): Payer: No Typology Code available for payment source | Admitting: Anesthesiology

## 2014-01-03 ENCOUNTER — Encounter (HOSPITAL_COMMUNITY): Payer: No Typology Code available for payment source | Admitting: Anesthesiology

## 2014-01-03 ENCOUNTER — Observation Stay (HOSPITAL_COMMUNITY)
Admission: RE | Admit: 2014-01-03 | Discharge: 2014-01-04 | Disposition: A | Discharge status: Home / Self Care | Payer: No Typology Code available for payment source | Source: Ambulatory Visit | Attending: Specialist | Admitting: Specialist

## 2014-01-03 ENCOUNTER — Encounter (HOSPITAL_COMMUNITY)
Admission: RE | Disposition: A | Discharge status: Home / Self Care | Payer: No Typology Code available for payment source | Source: Ambulatory Visit | Attending: Specialist

## 2014-01-03 ENCOUNTER — Inpatient Hospital Stay (HOSPITAL_COMMUNITY): Payer: No Typology Code available for payment source

## 2014-01-03 DIAGNOSIS — M5116 Intervertebral disc disorders with radiculopathy, lumbar region: Principal | ICD-10-CM | POA: Insufficient documentation

## 2014-01-03 DIAGNOSIS — I10 Essential (primary) hypertension: Secondary | ICD-10-CM | POA: Insufficient documentation

## 2014-01-03 DIAGNOSIS — M1990 Unspecified osteoarthritis, unspecified site: Secondary | ICD-10-CM | POA: Insufficient documentation

## 2014-01-03 DIAGNOSIS — M5126 Other intervertebral disc displacement, lumbar region: Secondary | ICD-10-CM | POA: Diagnosis present

## 2014-01-03 HISTORY — PX: LUMBAR LAMINECTOMY: SHX95

## 2014-01-03 SURGERY — MICRODISCECTOMY LUMBAR LAMINECTOMY
Anesthesia: General | Site: Spine Lumbar

## 2014-01-03 MED ORDER — BUPIVACAINE LIPOSOME 1.3 % IJ SUSP
INTRAMUSCULAR | Status: DC | PRN
  Administered 2014-01-03: 12.5 mL

## 2014-01-03 MED ORDER — METOPROLOL SUCCINATE ER 50 MG PO TB24
50.0000 mg | ORAL_TABLET | Freq: Every day | ORAL | Status: DC
  Filled 2014-01-03: qty 1

## 2014-01-03 MED ORDER — DEXAMETHASONE SODIUM PHOSPHATE 10 MG/ML IJ SOLN
10.0000 mg | Freq: Once | INTRAMUSCULAR | Status: AC
  Administered 2014-01-03: 10 mg via INTRAVENOUS

## 2014-01-03 MED ORDER — HYDROMORPHONE HCL 1 MG/ML IJ SOLN
INTRAMUSCULAR | Status: AC
  Administered 2014-01-03: 0.25 mg via INTRAVENOUS
  Filled 2014-01-03: qty 1

## 2014-01-03 MED ORDER — DOCUSATE SODIUM 100 MG PO CAPS
100.0000 mg | ORAL_CAPSULE | Freq: Two times a day (BID) | ORAL | Status: DC
  Administered 2014-01-03: 100 mg via ORAL
  Filled 2014-01-03 (×2): qty 1

## 2014-01-03 MED ORDER — POLYETHYLENE GLYCOL 3350 17 G PO PACK: 17.0000 g | PACK | Freq: Every day | ORAL | Status: DC | PRN

## 2014-01-03 MED ORDER — GLYCOPYRROLATE 0.2 MG/ML IJ SOLN
INTRAMUSCULAR | Status: DC | PRN
  Administered 2014-01-03: .8 mg via INTRAVENOUS

## 2014-01-03 MED ORDER — GELATIN ABSORBABLE 100 EX MISC
CUTANEOUS | Status: DC | PRN
  Administered 2014-01-03: 15:00:00 via TOPICAL

## 2014-01-03 MED ORDER — PROPOFOL 10 MG/ML IV BOLUS
INTRAVENOUS | Status: DC | PRN
  Administered 2014-01-03: 200 mg via INTRAVENOUS

## 2014-01-03 MED ORDER — ACETAMINOPHEN 650 MG RE SUPP: 650.0000 mg | RECTAL | Status: DC | PRN

## 2014-01-03 MED ORDER — PHENYLEPHRINE HCL 10 MG/ML IJ SOLN
INTRAMUSCULAR | Status: DC | PRN
  Administered 2014-01-03 (×2): 80 ug via INTRAVENOUS

## 2014-01-03 MED ORDER — SODIUM CHLORIDE 0.9 % IJ SOLN: 3.0000 mL | INTRAMUSCULAR | Status: DC | PRN

## 2014-01-03 MED ORDER — ONDANSETRON HCL 4 MG/2ML IJ SOLN
INTRAMUSCULAR | Status: DC | PRN
  Administered 2014-01-03: 4 mg via INTRAVENOUS

## 2014-01-03 MED ORDER — ALUM & MAG HYDROXIDE-SIMETH 200-200-20 MG/5ML PO SUSP: 30.0000 mL | Freq: Four times a day (QID) | ORAL | Status: DC | PRN

## 2014-01-03 MED ORDER — PHENYLEPHRINE 40 MCG/ML (10ML) SYRINGE FOR IV PUSH (FOR BLOOD PRESSURE SUPPORT)
PREFILLED_SYRINGE | INTRAVENOUS | Status: AC
  Filled 2014-01-03: qty 20

## 2014-01-03 MED ORDER — THROMBIN 20000 UNITS EX SOLR
CUTANEOUS | Status: AC
  Filled 2014-01-03: qty 20000

## 2014-01-03 MED ORDER — LACTATED RINGERS IV SOLN
INTRAVENOUS | Status: DC | PRN
  Administered 2014-01-03 (×2): via INTRAVENOUS

## 2014-01-03 MED ORDER — FLEET ENEMA 7-19 GM/118ML RE ENEM: 1.0000 | ENEMA | Freq: Once | RECTAL | Status: AC | PRN

## 2014-01-03 MED ORDER — ONDANSETRON HCL 4 MG/2ML IJ SOLN: 4.0000 mg | INTRAMUSCULAR | Status: DC | PRN

## 2014-01-03 MED ORDER — SODIUM CHLORIDE 0.9 % IJ SOLN: 3.0000 mL | Freq: Two times a day (BID) | INTRAMUSCULAR | Status: DC

## 2014-01-03 MED ORDER — METHOCARBAMOL 500 MG PO TABS
500.0000 mg | ORAL_TABLET | Freq: Four times a day (QID) | ORAL | Status: DC | PRN
Start: 2014-01-03 — End: 2014-01-04
  Administered 2014-01-03 – 2014-01-04 (×3): 500 mg via ORAL
  Filled 2014-01-03 (×2): qty 1

## 2014-01-03 MED ORDER — HYDROCHLOROTHIAZIDE 12.5 MG PO CAPS
12.5000 mg | ORAL_CAPSULE | Freq: Two times a day (BID) | ORAL | Status: DC
  Administered 2014-01-04: 12.5 mg via ORAL
  Filled 2014-01-03 (×3): qty 1

## 2014-01-03 MED ORDER — FENTANYL CITRATE 0.05 MG/ML IJ SOLN
INTRAMUSCULAR | Status: DC | PRN
  Administered 2014-01-03: 50 ug via INTRAVENOUS
  Administered 2014-01-03: 150 ug via INTRAVENOUS

## 2014-01-03 MED ORDER — AMLODIPINE BESYLATE 10 MG PO TABS
10.0000 mg | ORAL_TABLET | Freq: Every day | ORAL | Status: DC
  Filled 2014-01-03: qty 1

## 2014-01-03 MED ORDER — BISACODYL 10 MG RE SUPP: 10.0000 mg | Freq: Every day | RECTAL | Status: DC | PRN

## 2014-01-03 MED ORDER — DEXAMETHASONE SODIUM PHOSPHATE 10 MG/ML IJ SOLN
INTRAMUSCULAR | Status: AC
  Administered 2014-01-03: 10 mg via INTRAVENOUS
  Filled 2014-01-03: qty 1

## 2014-01-03 MED ORDER — PHENOL 1.4 % MT LIQD: 1.0000 | OROMUCOSAL | Status: DC | PRN

## 2014-01-03 MED ORDER — LISINOPRIL-HYDROCHLOROTHIAZIDE 20-12.5 MG PO TABS: 1.0000 | ORAL_TABLET | Freq: Two times a day (BID) | ORAL | Status: DC

## 2014-01-03 MED ORDER — BUPIVACAINE HCL 0.5 % IJ SOLN
INTRAMUSCULAR | Status: DC | PRN
  Administered 2014-01-03: 12.5 mL

## 2014-01-03 MED ORDER — METHOCARBAMOL 1000 MG/10ML IJ SOLN
500.0000 mg | Freq: Four times a day (QID) | INTRAMUSCULAR | Status: DC | PRN
  Filled 2014-01-03: qty 5

## 2014-01-03 MED ORDER — PROPOFOL 10 MG/ML IV BOLUS
INTRAVENOUS | Status: AC
  Filled 2014-01-03: qty 20

## 2014-01-03 MED ORDER — MIDAZOLAM HCL 2 MG/2ML IJ SOLN
INTRAMUSCULAR | Status: AC
  Filled 2014-01-03: qty 2

## 2014-01-03 MED ORDER — OXYCODONE HCL 5 MG PO TABS: 5.0000 mg | ORAL_TABLET | Freq: Once | ORAL | Status: DC | PRN

## 2014-01-03 MED ORDER — GLYCOPYRROLATE 0.2 MG/ML IJ SOLN
INTRAMUSCULAR | Status: AC
  Filled 2014-01-03: qty 4

## 2014-01-03 MED ORDER — METHOCARBAMOL 500 MG PO TABS
ORAL_TABLET | ORAL | Status: AC
  Administered 2014-01-04: 500 mg via ORAL
  Filled 2014-01-03: qty 1

## 2014-01-03 MED ORDER — EPHEDRINE SULFATE 50 MG/ML IJ SOLN
INTRAMUSCULAR | Status: DC | PRN
  Administered 2014-01-03: 5 mg via INTRAVENOUS
  Administered 2014-01-03: 15 mg via INTRAVENOUS
  Administered 2014-01-03 (×3): 5 mg via INTRAVENOUS
  Administered 2014-01-03: 10 mg via INTRAVENOUS
  Administered 2014-01-03: 5 mg via INTRAVENOUS

## 2014-01-03 MED ORDER — ONDANSETRON HCL 4 MG/2ML IJ SOLN
INTRAMUSCULAR | Status: AC
  Filled 2014-01-03: qty 2

## 2014-01-03 MED ORDER — MIDAZOLAM HCL 5 MG/5ML IJ SOLN
INTRAMUSCULAR | Status: DC | PRN
  Administered 2014-01-03: 2 mg via INTRAVENOUS

## 2014-01-03 MED ORDER — ROCURONIUM BROMIDE 100 MG/10ML IV SOLN
INTRAVENOUS | Status: DC | PRN
  Administered 2014-01-03: 50 mg via INTRAVENOUS

## 2014-01-03 MED ORDER — OXYCODONE HCL 5 MG PO TABS
15.0000 mg | ORAL_TABLET | ORAL | Status: DC | PRN
Start: 2014-01-03 — End: 2014-01-04
  Administered 2014-01-03: 10 mg via ORAL
  Administered 2014-01-03 – 2014-01-04 (×4): 15 mg via ORAL
  Filled 2014-01-03 (×4): qty 3

## 2014-01-03 MED ORDER — SODIUM CHLORIDE 0.9 % IV SOLN: 250.0000 mL | INTRAVENOUS | Status: DC

## 2014-01-03 MED ORDER — LISINOPRIL 20 MG PO TABS
20.0000 mg | ORAL_TABLET | Freq: Two times a day (BID) | ORAL | Status: DC
  Administered 2014-01-04: 20 mg via ORAL
  Filled 2014-01-03 (×3): qty 1

## 2014-01-03 MED ORDER — SODIUM CHLORIDE 0.9 % IV SOLN
INTRAVENOUS | Status: DC
  Administered 2014-01-03: 18:00:00 via INTRAVENOUS

## 2014-01-03 MED ORDER — CEFAZOLIN SODIUM 1-5 GM-% IV SOLN
1.0000 g | Freq: Three times a day (TID) | INTRAVENOUS | Status: AC
  Administered 2014-01-03 – 2014-01-04 (×2): 1 g via INTRAVENOUS
  Filled 2014-01-03 (×2): qty 50

## 2014-01-03 MED ORDER — PROMETHAZINE HCL 25 MG/ML IJ SOLN: 6.2500 mg | INTRAMUSCULAR | Status: DC | PRN

## 2014-01-03 MED ORDER — OXYCODONE HCL 5 MG PO TABS
ORAL_TABLET | ORAL | Status: AC
  Filled 2014-01-03: qty 1

## 2014-01-03 MED ORDER — LACTATED RINGERS IV SOLN
INTRAVENOUS | Status: DC
  Administered 2014-01-03: 11:00:00 via INTRAVENOUS

## 2014-01-03 MED ORDER — OXYCODONE HCL 5 MG/5ML PO SOLN: 5.0000 mg | Freq: Once | ORAL | Status: DC | PRN

## 2014-01-03 MED ORDER — ACETAMINOPHEN 325 MG PO TABS: 650.0000 mg | ORAL_TABLET | ORAL | Status: DC | PRN

## 2014-01-03 MED ORDER — MENTHOL 3 MG MT LOZG: 1.0000 | LOZENGE | OROMUCOSAL | Status: DC | PRN

## 2014-01-03 MED ORDER — FENTANYL CITRATE 0.05 MG/ML IJ SOLN
INTRAMUSCULAR | Status: AC
  Filled 2014-01-03: qty 5

## 2014-01-03 MED ORDER — 0.9 % SODIUM CHLORIDE (POUR BTL) OPTIME
TOPICAL | Status: DC | PRN
  Administered 2014-01-03: 1000 mL

## 2014-01-03 MED ORDER — HYDROMORPHONE HCL 1 MG/ML IJ SOLN
0.2500 mg | INTRAMUSCULAR | Status: DC | PRN
  Administered 2014-01-03 (×2): 0.5 mg via INTRAVENOUS
  Administered 2014-01-03 (×2): 0.25 mg via INTRAVENOUS
  Administered 2014-01-03: 0.5 mg via INTRAVENOUS

## 2014-01-03 MED ORDER — OXYCODONE HCL 15 MG PO TABS: 15.0000 mg | ORAL_TABLET | ORAL | Status: DC | PRN

## 2014-01-03 MED ORDER — LIDOCAINE HCL (CARDIAC) 20 MG/ML IV SOLN
INTRAVENOUS | Status: DC | PRN
  Administered 2014-01-03: 80 mg via INTRAVENOUS

## 2014-01-03 MED ORDER — SUCCINYLCHOLINE CHLORIDE 20 MG/ML IJ SOLN
INTRAMUSCULAR | Status: AC
  Filled 2014-01-03: qty 2

## 2014-01-03 MED ORDER — HYDROMORPHONE HCL 1 MG/ML IJ SOLN
0.5000 mg | INTRAMUSCULAR | Status: DC | PRN
  Administered 2014-01-03 – 2014-01-04 (×3): 1 mg via INTRAVENOUS
  Filled 2014-01-03 (×3): qty 1

## 2014-01-03 MED ORDER — HYDROMORPHONE HCL 1 MG/ML IJ SOLN
INTRAMUSCULAR | Status: AC
  Administered 2014-01-03: 0.5 mg via INTRAVENOUS
  Filled 2014-01-03: qty 1

## 2014-01-03 MED ORDER — OXYCODONE-ACETAMINOPHEN 10-325 MG PO TABS: 1.0000 | ORAL_TABLET | ORAL | Status: DC | PRN

## 2014-01-03 MED ORDER — NEOSTIGMINE METHYLSULFATE 10 MG/10ML IV SOLN
INTRAVENOUS | Status: DC | PRN
  Administered 2014-01-03: 4 mg via INTRAVENOUS

## 2014-01-03 MED ORDER — OXYCODONE HCL 5 MG PO TABS
ORAL_TABLET | ORAL | Status: AC
  Administered 2014-01-03: 15 mg via ORAL
  Filled 2014-01-03: qty 3

## 2014-01-03 MED ORDER — GABAPENTIN 100 MG PO CAPS
100.0000 mg | ORAL_CAPSULE | Freq: Three times a day (TID) | ORAL | Status: DC | PRN
  Administered 2014-01-03: 100 mg via ORAL
  Filled 2014-01-03: qty 1

## 2014-01-03 MED ORDER — ARTIFICIAL TEARS OP OINT
TOPICAL_OINTMENT | OPHTHALMIC | Status: DC | PRN
  Administered 2014-01-03: 1 via OPHTHALMIC

## 2014-01-03 MED ORDER — NEOSTIGMINE METHYLSULFATE 10 MG/10ML IV SOLN
INTRAVENOUS | Status: AC
  Filled 2014-01-03: qty 1

## 2014-01-03 MED ORDER — BUPIVACAINE LIPOSOME 1.3 % IJ SUSP
20.0000 mL | Freq: Once | INTRAMUSCULAR | Status: DC
  Filled 2014-01-03: qty 20

## 2014-01-03 SURGICAL SUPPLY — 58 items
BUR MATCHSTICK NEURO 3.0 LAGG (BURR) ×3 IMPLANT
BUR ROUND FLUTED 4 SOFT TCH (BURR) IMPLANT
BUR ROUND FLUTED 4MM SOFT TCH (BURR)
CANISTER SUCTION 2500CC (MISCELLANEOUS) ×3 IMPLANT
CORDS BIPOLAR (ELECTRODE) ×3 IMPLANT
COVER MAYO STAND STRL (DRAPES) ×3 IMPLANT
COVER SURGICAL LIGHT HANDLE (MISCELLANEOUS) ×3 IMPLANT
DERMABOND ADVANCED (GAUZE/BANDAGES/DRESSINGS) ×2
DERMABOND ADVANCED .7 DNX12 (GAUZE/BANDAGES/DRESSINGS) ×1 IMPLANT
DRAPE C-ARM 42X72 X-RAY (DRAPES) ×3 IMPLANT
DRAPE MICROSCOPE LEICA (MISCELLANEOUS) ×3 IMPLANT
DRAPE POUCH INSTRU U-SHP 10X18 (DRAPES) ×3 IMPLANT
DRAPE PROXIMA HALF (DRAPES) IMPLANT
DRAPE SURG 17X23 STRL (DRAPES) ×9 IMPLANT
DRSG MEPILEX BORDER 4X4 (GAUZE/BANDAGES/DRESSINGS) ×3 IMPLANT
DRSG MEPILEX BORDER 4X8 (GAUZE/BANDAGES/DRESSINGS) IMPLANT
DURAPREP 26ML APPLICATOR (WOUND CARE) ×3 IMPLANT
ELECT BLADE 4.0 EZ CLEAN MEGAD (MISCELLANEOUS) ×3
ELECT CAUTERY BLADE 6.4 (BLADE) ×3 IMPLANT
ELECT REM PT RETURN 9FT ADLT (ELECTROSURGICAL) ×3
ELECTRODE BLDE 4.0 EZ CLN MEGD (MISCELLANEOUS) ×1 IMPLANT
ELECTRODE REM PT RTRN 9FT ADLT (ELECTROSURGICAL) ×1 IMPLANT
GLOVE BIO SURGEON STRL SZ 6.5 (GLOVE) ×2 IMPLANT
GLOVE BIO SURGEONS STRL SZ 6.5 (GLOVE) ×1
GLOVE BIOGEL PI IND STRL 7.0 (GLOVE) ×1 IMPLANT
GLOVE BIOGEL PI IND STRL 7.5 (GLOVE) ×1 IMPLANT
GLOVE BIOGEL PI INDICATOR 7.0 (GLOVE) ×2
GLOVE BIOGEL PI INDICATOR 7.5 (GLOVE) ×2
GLOVE ECLIPSE 7.0 STRL STRAW (GLOVE) ×3 IMPLANT
GLOVE ECLIPSE 9.0 STRL (GLOVE) ×6 IMPLANT
GLOVE SURG 8.5 LATEX PF (GLOVE) ×6 IMPLANT
GOWN STRL REUS W/ TWL LRG LVL3 (GOWN DISPOSABLE) ×3 IMPLANT
GOWN STRL REUS W/TWL 2XL LVL3 (GOWN DISPOSABLE) ×3 IMPLANT
GOWN STRL REUS W/TWL LRG LVL3 (GOWN DISPOSABLE) ×6
GOWN STRL REUS W/TWL XL LVL3 (GOWN DISPOSABLE) ×3 IMPLANT
KIT BASIN OR (CUSTOM PROCEDURE TRAY) ×3 IMPLANT
KIT ROOM TURNOVER OR (KITS) ×3 IMPLANT
NEEDLE 22X1 1/2 (OR ONLY) (NEEDLE) ×3 IMPLANT
NEEDLE SPNL 18GX3.5 QUINCKE PK (NEEDLE) ×6 IMPLANT
NS IRRIG 1000ML POUR BTL (IV SOLUTION) ×3 IMPLANT
PACK LAMINECTOMY ORTHO (CUSTOM PROCEDURE TRAY) ×3 IMPLANT
PAD ARMBOARD 7.5X6 YLW CONV (MISCELLANEOUS) ×6 IMPLANT
PATTIES SURGICAL .5 X.5 (GAUZE/BANDAGES/DRESSINGS) ×3 IMPLANT
PATTIES SURGICAL .75X.75 (GAUZE/BANDAGES/DRESSINGS) IMPLANT
SPONGE LAP 4X18 X RAY DECT (DISPOSABLE) IMPLANT
SPONGE SURGIFOAM ABS GEL 100 (HEMOSTASIS) IMPLANT
SUT VIC AB 1 CT1 27 (SUTURE) ×2
SUT VIC AB 1 CT1 27XBRD ANBCTR (SUTURE) ×1 IMPLANT
SUT VIC AB 2-0 CT1 27 (SUTURE)
SUT VIC AB 2-0 CT1 TAPERPNT 27 (SUTURE) IMPLANT
SUT VICRYL 0 UR6 27IN ABS (SUTURE) ×3 IMPLANT
SUT VICRYL 4-0 PS2 18IN ABS (SUTURE) ×3 IMPLANT
SYR 20CC LL (SYRINGE) ×3 IMPLANT
SYR CONTROL 10ML LL (SYRINGE) ×3 IMPLANT
TOWEL OR 17X24 6PK STRL BLUE (TOWEL DISPOSABLE) ×3 IMPLANT
TOWEL OR 17X26 10 PK STRL BLUE (TOWEL DISPOSABLE) ×3 IMPLANT
TRAY FOLEY CATH 16FRSI W/METER (SET/KITS/TRAYS/PACK) IMPLANT
WATER STERILE IRR 1000ML POUR (IV SOLUTION) ×3 IMPLANT

## 2014-01-03 NOTE — Brief Op Note (Signed)
01/03/2014  4:13 PM  PATIENT:  Michael JumboJohn F Favia  53 y.o. 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 Jacklynn BueMassagee  EBL:  Total I/O In: 1000 [I.V.:1000] Out: 100 [Blood:100]  BLOOD ADMINISTERED:none  DRAINS: none   LOCAL MEDICATIONS USED:  MARCAINE1/2% 1:1 EXPAREL 1.3%, Amount: 30 ml   SPECIMEN:  No Specimen  DISPOSITION OF SPECIMEN:  N/A  COUNTS:  YES  TOURNIQUET:  * No tourniquets in log *  DICTATION: .Dragon Dictation  PLAN OF CARE: Admit for overnight observation  PATIENT DISPOSITION:  PACU - hemodynamically stable.   Delay start of Pharmacological VTE agent (>24hrs) due to surgical blood loss or risk of bleeding: yes

## 2014-01-03 NOTE — Anesthesia Procedure Notes (Signed)
Procedure Name: Intubation Date/Time: 01/03/2014 1:54 PM Performed by: Carmela RimaMARTINELLI, Artrell F Pre-anesthesia Checklist: Patient identified, Timeout performed, Emergency Drugs available, Suction available and Patient being monitored Patient Re-evaluated:Patient Re-evaluated prior to inductionOxygen Delivery Method: Circle system utilized Preoxygenation: Pre-oxygenation with 100% oxygen Intubation Type: IV induction Ventilation: Mask ventilation without difficulty Laryngoscope Size: Mac and 3 Grade View: Grade II Tube type: Oral Tube size: 7.5 mm Number of attempts: 1 Placement Confirmation: positive ETCO2,  ETT inserted through vocal cords under direct vision and breath sounds checked- equal and bilateral Secured at: 23 cm Tube secured with: Tape Dental Injury: Teeth and Oropharynx as per pre-operative assessment

## 2014-01-03 NOTE — Interval H&P Note (Signed)
History and Physical Interval Note:  01/03/2014 12:34 PM  Michael Bell  has presented today for surgery, with the diagnosis of Right L4-5 Extraforaminal herniated nucleus pulposus with right L4 radiculopathy  The various methods of treatment have been discussed with the patient and family. After consideration of risks, benefits and other options for treatment, the patient has consented to  Procedure(s): RIGHT EXTRAFORAMINAL APPROACH TO 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 (N/A) as a surgical intervention .  The patient's history has been reviewed, patient examined, no change in status, stable for surgery.  I have reviewed the patient's chart and labs.  Questions were answered to the patient's satisfaction.     [name] E

## 2014-01-03 NOTE — Anesthesia Postprocedure Evaluation (Signed)
  Anesthesia Post-op Note  Patient: Michael JumboJohn F Bell  Procedure(s) Performed: Procedure(s): RIGHT EXTRAFORAMINAL APPROACH TO 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 (N/A)  Patient Location: PACU  Anesthesia Type:General  Level of Consciousness: awake and alert   Airway and Oxygen Therapy: Patient Spontanous Breathing  Post-op Pain: mild  Post-op Assessment: Post-op Vital signs reviewed  Post-op Vital Signs: stable  Last Vitals:  Filed Vitals:   01/03/14 1730  BP: 109/64  Pulse: 57  Temp:   Resp: 19    Complications: No apparent anesthesia complications

## 2014-01-03 NOTE — Op Note (Signed)
01/03/2014  4:16 PM  PATIENT:  Michael Bell  53 y.o. male  MRN: [id number]  OPERATIVE REPORT  PRE-OPERATIVE DIAGNOSIS:  Right L4-5 Extraforaminal herniated nucleus pulposus with right L4 radiculopathy  POST-OPERATIVE DIAGNOSIS:  Right L4-5 Extraforaminal herniated nucleus pulposus with right L4 radiculopathy  PROCEDURE:  Procedure(s): RIGHT EXTRAFORAMINAL APPROACH TO 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    SURGEON:  Jessy Oto, MD     ASSISTANT:  Phillips Hay, PA-C  (Present throughout the entire procedure and necessary for completion of procedure in a timely manner)     ANESTHESIA:  General,supplemented with local MARCAINE1/2% 1:1 EXPAREL 1.3%, Amount: 30 ml. Dr. Orene Desanctis.  COMPLICATIONS:  None.  EBL: 100cc    PROCEDURE: The patient was met in the holding area, and the appropriate right L4-5 identified and marked with an "X" and my initials.  The patient was then transported to [facility]5. The patient was then placed under  general anesthesia without difficulty and was placed on the operative table in a prone position. Wilson frame and sliding OR table. The patient received appropriate preoperative antibiotic prophylaxis Ancef 2 g.   The thoracolumbar spine then prepped using sterile conditions with DuraPrep and draped using sterile technique.  Time-out procedure was called and correct. Loupe magnification and head lamp was used. 2 spinal needles placed to the right of the midline by 3 cm and intraoperative lateral C-arm demonstrated the lower needle directed towards the L4-5 disc on the right side. Skin infiltrated in the left side paramedian skin area with MARCAINE1/2% 1:1 EXPAREL 1.3% 10 cc used. Skin incision approximately 3-1/2 cm in length sagittal orientation paramedian approximately 3-1/2 cm for the right of the midline. Incision through skin and subcutaneous layers to the lumbodorsal fascia bleeders controlled using electrocautery. Lumbodorsal fascia then  incised in line with the skin incision and then blunt dissection using a finger down to the level of the transverse processes of the [place]se lateral approach. The MIS dilators then placed in the incision and lateral C-arm demonstrated the dilators slightly superior to the L4-5 disc space so that these were redirected and exposure obtained of the posterior lateral aspect of the L4-5 level identifying the transverse process of L5 and L4 then placing additional medial and lateral retractors. Small amount of muscle was debrided over the posterior aspect of the intertransverse membrane. And then a small amount of the superior aspect of the transverse process of L5 was resected using 2 and 3 mm Kerrisons releasing the intertransverse membrane and ligament. The operating room microscope carefully draped brought into the field sterilely. Under the operating room microscope then the superior medial and to the transverse process of L5 was carefully exposed and the pedicle of L5 identified. Small leash of vessels cauterized using bipolar electrocautery just medial and superior to the transverse process of L5 at its intersection with the pedicle and the lateral aspect of the superior articular process of L5. Penfield 4 was then used to carefully palpate along the superior lateral aspect of the L5 pedicle identifing the disc space. The Penfield 4 used as a retractor carefully retracting the inferior medial aspect of the L4 nerve root superiorly and laterally. With this then identified with careful palpation using a Penfield 4. Penfield 4 then used to make an opening into the posterior lateral aspect of the L4-5 disc and the opening into the disc developed using micropituitary as well as pituitaries with teeth directing the pituitaries medially, resecting disc material. Several small fragments  of disc material were resected from the area just beneath the annulus. Intraoperative C-arm fluoroscopy identified the spinal needle  located within the L4-5 disc completing localization during the procedure. Careful inspection of the neuroforamen of a woodson as well as a hockey-stick nerve probe demonstrated neuroforamen to be free and the L4 nerve root exiting without further compression following the resection of the small amount of submandibular and entry into the disc material. Additionally the nerve root was freed from pressure posteriorly this area by resecting portion of the intertransverse ligament as it pressed nerve forward against this protruded disc. Irrigation was then carried out using copious amounts of irrigant solution. Minimal bleeding is present felt to represent bleeding from the disc itself. There was no active bleeding that would be cauterized her otherwise. In careful then carefully the MIS retractors were then removed exploring the incision with its removal no active bleeders were noted. Incision then closed using a UR 6-0 Vicryl suture to the lumbodorsal fascia and subcutaneous layers. Superficial layers of proximal with interrupted 2-0 Vicryl sutures and the skin closed with a running subcutaneous stitch of 4-0 Vicryl Dermabond was applied then MedPlex bandage. All instrument and sponge counts were correct.Sterile in and out foley catheter at the end of the case. Patient was then returned to supine position reactivated extubated and returned to recovery room in satisfactory condition.  Phillips Hay PA-C perform the duties of assistant surgeon during this case. She was present from the beginning of the case to the end of the case assisting in transfer the patient from his stretcher to the OR table and back to the stretcher at the end of the case. Assisted in careful retraction and suction of the laminectomy site delicate neural structures operating under the operating room microscope. She performed closure of the incision from the fascia to the skin applying the dressing.         Caressa Scearce E  01/03/2014, 4:16  PM

## 2014-01-03 NOTE — Transfer of Care (Signed)
Immediate Anesthesia Transfer of Care Note  Patient: Michael Bell  Procedure(s) Performed: Procedure(s): RIGHT EXTRAFORAMINAL APPROACH TO 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 (N/A)  Patient Location: PACU  Anesthesia Type:General  Level of Consciousness: awake, alert , oriented and patient cooperative  Airway & Oxygen Therapy: Patient Spontanous Breathing and Patient connected to nasal cannula oxygen  Post-op Assessment: Report given to PACU RN, Post -op Vital signs reviewed and stable, Patient moving all extremities and Patient moving all extremities X 4  Post vital signs: Reviewed and stable  Complications: No apparent anesthesia complications

## 2014-01-03 NOTE — Anesthesia Preprocedure Evaluation (Addendum)
Anesthesia Evaluation  Patient identified by MRN, date of birth, ID band Patient awake    Reviewed: Allergy & Precautions, H&P , NPO status , Patient's Chart, lab work & pertinent test results  History of Anesthesia Complications Negative for: history of anesthetic complications  Airway Mallampati: I TM Distance: >3 FB Neck ROM: Full    Dental  (+) Edentulous Upper, Edentulous Lower, Dental Advidsory Given   Pulmonary Current Smoker,    Pulmonary exam normal       Cardiovascular hypertension, On Medications     Neuro/Psych  Headaches, negative psych ROS   GI/Hepatic negative GI ROS, Neg liver ROS,   Endo/Other  negative endocrine ROS  Renal/GU negative Renal ROS     Musculoskeletal   Abdominal   Peds  Hematology negative hematology ROS (+)   Anesthesia Other Findings 2 ppd smoker  Reproductive/Obstetrics                         Anesthesia Physical Anesthesia Plan  ASA: II  Anesthesia Plan: General   Post-op Pain Management:    Induction: Intravenous  Airway Management Planned: Oral ETT  Additional Equipment:   Intra-op Plan:   Post-operative Plan: Extubation in OR  Informed Consent: I have reviewed the patients History and Physical, chart, labs and discussed the procedure including the risks, benefits and alternatives for the proposed anesthesia with the patient or authorized representative who has indicated his/her understanding and acceptance.   Dental advisory given and Dental Advisory Given  Plan Discussed with: CRNA, Anesthesiologist and Surgeon  Anesthesia Plan Comments:       Anesthesia Quick Evaluation

## 2014-01-03 NOTE — Discharge Instructions (Signed)
No lifting greater than 10 lbs. Avoid bending, stooping and twisting. Walk in house for first week them may start to get out slowly increasing distance up to one mile by 3 weeks post op. Keep incision dry for 3 days, may use tegaderm or similar water impervious dressing. Change dressing daily or as needed. Ice packs to back daily or as needed.

## 2014-01-04 DIAGNOSIS — M5116 Intervertebral disc disorders with radiculopathy, lumbar region: Secondary | ICD-10-CM | POA: Diagnosis not present

## 2014-01-04 NOTE — Progress Notes (Signed)
Subjective: 1 Day Post-Op Procedure(s) (LRB): RIGHT EXTRAFORAMINAL APPROACH TO 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 (N/A) Patient reports pain as mild.    Objective: Vital signs in last 24 hours: Temp:  [97 F (36.1 Bell)-98.5 F (36.9 Bell)] 98 F (36.7 Bell) (10/10 0538) Pulse Rate:  [56-72] 70 (10/10 0538) Resp:  [12-22] 16 (10/10 0538) BP: (98-125)/(58-86) 110/66 mmHg (10/10 0538) SpO2:  [93 %-100 %] 95 % (10/10 0538) Weight:  [110.763 kg (244 lb 3 oz)] 110.763 kg (244 lb 3 oz) (10/09 1040)  Intake/Output from previous day: 10/09 0701 - 10/10 0700 In: 2160 [P.O.:360; I.V.:1800] Out: 100 [Blood:100] Intake/Output this shift: Total I/O In: 360 [P.O.:360] Out: -   No results found for this basename: HGB,  in the last 72 hours No results found for this basename: WBC, RBC, HCT, PLT,  in the last 72 hours No results found for this basename: NA, K, CL, CO2, BUN, CREATININE, GLUCOSE, CALCIUM,  in the last 72 hours No results found for this basename: LABPT, INR,  in the last 72 hours  Neurologically intact  Assessment/Plan: 1 Day Post-Op Procedure(s) (LRB): RIGHT EXTRAFORAMINAL APPROACH TO 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 (N/A) Plan  Discharge home.   Michael Bell 01/04/2014, 5:46 AM

## 2014-01-04 NOTE — Progress Notes (Signed)
Utilization Review completed.  

## 2014-01-06 ENCOUNTER — Encounter (HOSPITAL_COMMUNITY): Payer: Self-pay | Admitting: Specialist

## 2014-01-06 MED FILL — Thrombin For Soln 20000 Unit: CUTANEOUS | Qty: 1 | Status: AC

## 2014-01-06 NOTE — Care Management Note (Signed)
CARE MANAGEMENT NOTE 01/06/2014  Patient:  Michael Bell,Michael Bell   Account Number:  [id number]  Date Initiated:  01/06/2014  Documentation initiated by:  Vance PeperBRADY,Jenaro Souder  Subjective/Objective Assessment:   53 yr old male admitted with right herniated nucleus pulpous, patient underwent right L4-5 extraforaminal approach to excision of herniated nucleus pulposus.     Action/Plan:   No home health or DME needs identified.   Anticipated DC Date:  01/04/2014   Anticipated DC Plan:  HOME/SELF CARE      DC Planning Services  NA      Choice offered to / List presented to:     DME arranged  NA        HH arranged  NA      Status of service:  Completed, signed off Medicare Important Message given?   (If response is "NO", the following Medicare IM given date fields will be blank) Date Medicare IM given:   Medicare IM given by:   Date Additional Medicare IM given:   Additional Medicare IM given by:    Discharge Disposition:  HOME/SELF CARE  Per UR Regulation:  Reviewed for med. necessity/level of care/duration of stay

## 2014-01-15 NOTE — Discharge Summary (Signed)
Physician Discharge Summary  Patient ID: Michael Bell MRN: [id number] DOB/AGE: [birth year] 53 y.o.  Admit date: 01/03/2014 Discharge date: 01/04/2014  Admission Diagnoses:  HNP (herniated nucleus pulposus), lumbar: right L4-5 extraforaminal HNP with right L4 radiculopathy  Discharge Diagnoses:  Principal Problem:   HNP (herniated nucleus pulposus), lumbar right L4-5  Past Medical History  Diagnosis Date  . Arthritis   . Hypertension   . Headache     Surgeries: Procedure(s): RIGHT EXTRAFORAMINAL APPROACH TO 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 on 01/03/2014   Consultants (if any):  none  Discharged Condition: Improved  Hospital Course: Michael Bell is an 53 y.o. male who was admitted 01/03/2014 with a diagnosis of HNP (herniated nucleus pulposus), lumbar and went to the operating room on 01/03/2014 and underwent the above named procedures.    He was given perioperative antibiotics:      Anti-infectives   Start     Dose/Rate Route Frequency Ordered Stop   01/03/14 2200  ceFAZolin (ANCEF) IVPB 1 g/50 mL premix     1 g 100 mL/hr over 30 Minutes Intravenous Every 8 hours 01/03/14 1851 01/04/14 0633   01/03/14 0600  ceFAZolin (ANCEF) IVPB 2 g/50 mL premix     2 g 100 mL/hr over 30 Minutes Intravenous On call to O.R. 01/02/14 1431 01/03/14 1405    .  He was given sequential compression devices, early ambulation for DVT prophylaxis.  He benefited maximally from the hospital stay and there were no complications.    Recent vital signs:  Filed Vitals:   01/04/14 0538  BP: 110/66  Pulse: 70  Temp: 98 F (36.7 C)  Resp: 16    Recent laboratory studies:  Lab Results  Component Value Date   HGB 14.4 12/27/2013   HGB 14.2 10/07/2013   Lab Results  Component Value Date   WBC 10.0 12/27/2013   PLT 226 12/27/2013   No results found for this basename: INR   Lab Results  Component Value Date   NA 136* 12/27/2013   K 4.3 12/27/2013   CL 101 12/27/2013   CO2 23  12/27/2013   BUN 15 12/27/2013   CREATININE 1.09 12/27/2013   GLUCOSE 108* 12/27/2013    Discharge Medications:     Medication List         amLODipine 10 MG tablet  Commonly known as:  NORVASC  Take 1 tablet (10 mg total) by mouth daily.     gabapentin 100 MG capsule  Commonly known as:  NEURONTIN  Take 100 mg by mouth 3 (three) times daily as needed (for pain).     GOODY HEADACHE PO  Take 1 packet by mouth every 6 (six) hours as needed (for pain).     lisinopril-hydrochlorothiazide 20-12.5 MG per tablet  Commonly known as:  PRINZIDE,ZESTORETIC  Take 1 tablet by mouth 2 (two) times daily.     metoprolol succinate 50 MG 24 hr tablet  Commonly known as:  TOPROL-XL  Take 50 mg by mouth daily. Take with or immediately following a meal.     Oxycodone HCl 10 MG Tabs  Take 10 mg by mouth every 4 (four) hours as needed (for pain).     oxyCODONE 15 MG immediate release tablet  Commonly known as:  ROXICODONE  Take 1 tablet (15 mg total) by mouth every 4 (four) hours as needed for severe pain.     oxyCODONE-acetaminophen 10-325 MG per tablet  Commonly known as:  PERCOCET  Take 1 tablet  by mouth every 4 (four) hours as needed for pain.        Diagnostic Studies: Dg Lumbar Spine 2-3 Views  01/03/2014   CLINICAL DATA:  Subsequent encounter for right L4-5 extra foraminal herniated disc.  EXAM: LUMBAR SPINE - 2-3 VIEW; DG C-ARM 61-120 MIN  COMPARISON:  None.  FINDINGS: Two intraoperative spot fluoro images are submitted for review. There is no previous compared of imaging to correlate vertebral body numbering. Field-of-view was quite small on both exams which limits assessment. On the lateral film, based on the position of the iliac crests, the caudal most fully demonstrated vertebral body is likely L5. This would place the radiopaque needle tip overlying the posterior aspect of the L4-5 interspace. Soft tissue retractors are noted posteriorly.  IMPRESSION: Difficult to definitively localize  needle tip position given the small field of view. Needle tip position most likely overlies the posterior aspect of the L4-5 interspace on the lateral film.   Electronically Signed   By: Kennith CenterEric  Mansell M.D.   On: 01/03/2014 16:28   Dg C-arm 1-60 Min  01/03/2014   CLINICAL DATA:  Subsequent encounter for right L4-5 extra foraminal herniated disc.  EXAM: LUMBAR SPINE - 2-3 VIEW; DG C-ARM 61-120 MIN  COMPARISON:  None.  FINDINGS: Two intraoperative spot fluoro images are submitted for review. There is no previous compared of imaging to correlate vertebral body numbering. Field-of-view was quite small on both exams which limits assessment. On the lateral film, based on the position of the iliac crests, the caudal most fully demonstrated vertebral body is likely L5. This would place the radiopaque needle tip overlying the posterior aspect of the L4-5 interspace. Soft tissue retractors are noted posteriorly.  IMPRESSION: Difficult to definitively localize needle tip position given the small field of view. Needle tip position most likely overlies the posterior aspect of the L4-5 interspace on the lateral film.   Electronically Signed   By: Kennith CenterEric  Mansell M.D.   On: 01/03/2014 16:28    Disposition: 01-Home or Self Care  DISCHARGE INSTRUCTIONS:  No lifting greater than 10 lbs. Avoid bending, stooping and twisting. Walk in house for first week them may start to get out slowly increasing distance up to one mile by 3 weeks post op. Keep incision dry for 3 days, may use tegaderm or similar water impervious dressing. Change dressing daily or as needed. Ice packs to back daily or as needed.  Follow-up Information   Follow up with NITKA,JAMES E, MD In 2 weeks.   Specialty:  Orthopedic Surgery   Contact information:   [address] [place] [address] [phone]        Signed: Wende NeighborsVERNON,Taleia Sadowski M 01/15/2014, 3:51 PM

## 2014-01-17 NOTE — Discharge Summary (Signed)
Patient d/c summary note and lab reviewed.  

## 2014-02-06 ENCOUNTER — Encounter: Payer: No Typology Code available for payment source | Admitting: Family Medicine

## 2014-04-02 ENCOUNTER — Other Ambulatory Visit: Payer: Self-pay | Admitting: Specialist

## 2014-04-02 DIAGNOSIS — M545 Low back pain: Secondary | ICD-10-CM

## 2014-04-03 ENCOUNTER — Ambulatory Visit
Admission: RE | Admit: 2014-04-03 | Discharge: 2014-04-03 | Disposition: A | Discharge status: Home / Self Care | Payer: 59 | Source: Ambulatory Visit | Attending: Specialist | Admitting: Specialist

## 2014-04-03 DIAGNOSIS — M545 Low back pain: Secondary | ICD-10-CM

## 2014-04-03 MED ORDER — GADOBENATE DIMEGLUMINE 529 MG/ML IV SOLN
20.0000 mL | Freq: Once | INTRAVENOUS | Status: AC | PRN
  Administered 2014-04-03: 20 mL via INTRAVENOUS

## 2015-01-02 ENCOUNTER — Emergency Department (HOSPITAL_COMMUNITY)
Admission: EM | Admit: 2015-01-02 | Discharge: 2015-01-02 | Disposition: A | Discharge status: Home / Self Care | Payer: 59 | Attending: Emergency Medicine | Admitting: Emergency Medicine

## 2015-01-02 ENCOUNTER — Encounter (HOSPITAL_COMMUNITY): Payer: Self-pay | Admitting: Emergency Medicine

## 2015-01-02 ENCOUNTER — Emergency Department (HOSPITAL_COMMUNITY): Payer: 59

## 2015-01-02 DIAGNOSIS — Z720 Tobacco use: Secondary | ICD-10-CM | POA: Insufficient documentation

## 2015-01-02 DIAGNOSIS — X58XXXA Exposure to other specified factors, initial encounter: Secondary | ICD-10-CM | POA: Diagnosis not present

## 2015-01-02 DIAGNOSIS — S4992XA Unspecified injury of left shoulder and upper arm, initial encounter: Secondary | ICD-10-CM | POA: Diagnosis present

## 2015-01-02 DIAGNOSIS — R030 Elevated blood-pressure reading, without diagnosis of hypertension: Secondary | ICD-10-CM

## 2015-01-02 DIAGNOSIS — M1990 Unspecified osteoarthritis, unspecified site: Secondary | ICD-10-CM | POA: Diagnosis not present

## 2015-01-02 DIAGNOSIS — G8929 Other chronic pain: Secondary | ICD-10-CM | POA: Insufficient documentation

## 2015-01-02 DIAGNOSIS — Z79899 Other long term (current) drug therapy: Secondary | ICD-10-CM | POA: Diagnosis not present

## 2015-01-02 DIAGNOSIS — Y998 Other external cause status: Secondary | ICD-10-CM | POA: Diagnosis not present

## 2015-01-02 DIAGNOSIS — Y9289 Other specified places as the place of occurrence of the external cause: Secondary | ICD-10-CM | POA: Diagnosis not present

## 2015-01-02 DIAGNOSIS — IMO0001 Reserved for inherently not codable concepts without codable children: Secondary | ICD-10-CM

## 2015-01-02 DIAGNOSIS — Y9389 Activity, other specified: Secondary | ICD-10-CM | POA: Insufficient documentation

## 2015-01-02 DIAGNOSIS — I10 Essential (primary) hypertension: Secondary | ICD-10-CM | POA: Diagnosis not present

## 2015-01-02 DIAGNOSIS — S46102A Unspecified injury of muscle, fascia and tendon of long head of biceps, left arm, initial encounter: Secondary | ICD-10-CM | POA: Insufficient documentation

## 2015-01-02 DIAGNOSIS — T1490XA Injury, unspecified, initial encounter: Secondary | ICD-10-CM

## 2015-01-02 MED ORDER — KETOROLAC TROMETHAMINE 60 MG/2ML IM SOLN
60.0000 mg | Freq: Once | INTRAMUSCULAR | Status: AC
  Administered 2015-01-02: 60 mg via INTRAMUSCULAR
  Filled 2015-01-02: qty 2

## 2015-01-02 MED ORDER — DICLOFENAC SODIUM 50 MG PO TBEC: 50.0000 mg | DELAYED_RELEASE_TABLET | Freq: Two times a day (BID) | ORAL | Status: DC

## 2015-01-02 MED ORDER — OXYCODONE-ACETAMINOPHEN 5-325 MG PO TABS
2.0000 | ORAL_TABLET | Freq: Once | ORAL | Status: AC
  Administered 2015-01-02: 2 via ORAL
  Filled 2015-01-02: qty 2

## 2015-01-02 MED ORDER — OXYCODONE-ACETAMINOPHEN 7.5-325 MG PO TABS: 1.0000 | ORAL_TABLET | ORAL | Status: DC | PRN

## 2015-01-02 NOTE — ED Provider Notes (Signed)
CSN: [id number]     Arrival date & time 01/02/15  2022 History  By signing my name below, I, Placido Sou, attest that this documentation has been prepared under the direction and in the presence of Kerrie Buffalo, NP. Electronically Signed: Placido Sou, ED Scribe. 01/02/2015. 8:52 PM.   Chief Complaint  Patient presents with  . Arm Pain   The history is provided by the patient. No language interpreter was used.    HPI Comments: Michael Bell is a 54 y.o. male who presents to the Emergency Department complaining of constant, moderate, upper left arm and shoulder pain with onset 1 month ago and worsening symptoms beginning yesterday. Pt notes that he initially injured the affected region when moving a heavy bag of animal feed and yesterday he moved a refrigerator which exacerbated his chronic pain. He rates his current pain as a 10/10 and further notes taking Goody Powders for pain management which have provided no relief of his symptoms. Pt notes some initial numbness and tingling to the affected arm which has since subsided and confirms a radiation of his pain down to his left hand that worsens with any movement. He denies any hx of injury to the affected arm. Pt denies any other associated symptoms.   Past Medical History  Diagnosis Date  . Arthritis   . Hypertension   . Headache    Past Surgical History  Procedure Laterality Date  . Nose surgery      40 yrs ago  . 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 Ovarian cancer Mother 79    primary peritoneal cancer  . Cancer Mother   . Diabetes Mother   . Heart disease Mother   . Hyperlipidemia Mother   . Hypertension Mother   . Stroke Mother   . Mental illness Mother   . Colon polyps Father     11 on last  colonoscopy  . Heart disease Father   . Hyperlipidemia Father   . Hypertension Father   . Stroke Father   . Colon cancer Sister 3    cancer of the sigmoid colon  . COPD Maternal Grandmother   . Stomach cancer Paternal Grandmother   . Down syndrome Paternal Aunt   . Cancer Paternal Uncle     cancer of the spine  . Heart disease Brother   . Hypertension Brother   . Heart disease Brother   . Hyperlipidemia Brother   . Hypertension Brother   . Cancer Brother   . Heart disease Brother   . Mental illness Brother   . Hypertension Brother    Social History  Substance Use Topics  . Smoking status: Current Every Day Smoker -- 0.00 packs/day for 38 years    Types: Cigarettes  . Smokeless tobacco: None  . Alcohol Use: Yes    Review of Systems  Musculoskeletal: Positive for myalgias and arthralgias.       Left bicep pain  All other systems reviewed and are negative.  Allergies  Review of patient's allergies indicates no known allergies.  Home Medications   Prior to Admission medications   Medication Sig Start Date End Date Taking? Authorizing Provider  amLODipine (NORVASC) 10 MG tablet Take 1 tablet (10 mg total) by mouth daily. 07/19/13  Maurice March, MD  Aspirin-Acetaminophen-Caffeine (GOODY HEADACHE PO) Take 1 packet by mouth every 6 (six) hours as needed (for pain).    Historical Provider, MD  diclofenac (VOLTAREN) 50 MG EC tablet Take 1 tablet (50 mg total) by mouth 2 (two) times daily. 01/02/15   Hope Orlene Och, NP  gabapentin (NEURONTIN) 100 MG capsule Take 100 mg by mouth 3 (three) times daily as needed (for pain).    Historical Provider, MD  lisinopril-hydrochlorothiazide (PRINZIDE,ZESTORETIC) 20-12.5 MG per tablet Take 1 tablet by mouth 2 (two) times daily.    Historical Provider, MD  metoprolol succinate (TOPROL-XL) 50 MG 24 hr tablet Take 50 mg by mouth daily. Take with or immediately following a meal.    Historical Provider, MD  oxyCODONE-acetaminophen (PERCOCET)  7.5-325 MG tablet Take 1 tablet by mouth every 4 (four) hours as needed for severe pain. 01/02/15   Hope Orlene Och, NP   BP 166/109 mmHg  Pulse 65  Temp(Src) 98 F (36.7 C) (Oral)  Resp 20  SpO2 98% Physical Exam  Constitutional: He is oriented to person, place, and time. He appears well-developed and well-nourished.  HENT:  Head: Normocephalic and atraumatic.  Mouth/Throat: No oropharyngeal exudate.  Neck: Normal range of motion. No tracheal deviation present.  Cardiovascular: Normal rate.   Pulmonary/Chest: Effort normal. No respiratory distress.  Abdominal: Soft. There is no tenderness.  Musculoskeletal: He exhibits edema and tenderness.  No concern for deltoid disruption at this time; able to raise shoulder; tenderness and swelling to the left bicep muscle; radial pulses 2+ bilaterally  Neurological: He is alert and oriented to person, place, and time.  Skin: Skin is warm and dry. He is not diaphoretic.  Psychiatric: He has a normal mood and affect. His behavior is normal.  Nursing note and vitals reviewed.  ED Course  Procedures  DIAGNOSTIC STUDIES: Oxygen Saturation is 95% on RA, adequate by my interpretation.    COORDINATION OF CARE: 8:49 PM Discussed treatment plan with pt at bedside and pt agreed to plan.  Labs Review Labs Reviewed - No data to display  Imaging Review Ct Humerus Left Wo Contrast  01/02/2015   CLINICAL DATA:  Felt something pop at left upper humerus while lifting refrigerator. Initial encounter.  EXAM: CT OF THE LEFT HUMERUS WITHOUT CONTRAST  TECHNIQUE: Multidetector CT imaging was performed according to the standard protocol. Multiplanar CT image reconstructions were also generated.  COMPARISON:  There is no evidence fracture or dislocation.  FINDINGS: There appears to be a tiny osseous fragment arising at the medial aspect of the medial humeral condyle. This may simply be degenerative in nature, though acute avulsion fracture cannot be entirely excluded.  Would correlate for any associated symptoms.  There is no additional evidence for fracture. Several chronic osseous fragments are seen at the region of the radial epicondyle. The left humeral head remains seated at the glenoid fossa. The left acromioclavicular joint is unremarkable in appearance. No significant joint effusion is seen.  The soft tissues are difficult to fully assess without contrast. Mild soft tissue inflammation and edema are noted along the biceps musculature and overlying subcutaneous fat. The proximal biceps is not well assessed, and there appears to be a region of intramuscular hemorrhage at the proximal biceps. Would correlate for any evidence of a biceps tear.  The visualized portions of the left lung are clear. The left maxillary region is unremarkable in appearance. The visualized portions of the mediastinum and left upper quadrant are unremarkable.  IMPRESSION: 1.  Mild soft tissue inflammation and edema along the biceps musculature and overlying subcutaneous fat. The proximal biceps is not well assessed, and there appears to be a region of intramuscular hemorrhage at the proximal biceps. Would correlate for evidence of a biceps tear. 2. Tiny osseous fragment at the medial aspect of the medial humeral condyle. This may simply be degenerative in nature, though acute avulsion fracture cannot be entirely excluded. Would correlate for any associated symptoms. 3. Several chronic osseous fragments noted about the radial epicondyle, reflecting degenerative change.   Electronically Signed   By: Roanna Raider M.D.   On: 01/02/2015 23:01   I have personally reviewed and evaluated these images as part of my medical decision-making.  I discussed this case with Dr. Juleen China and CT scan reviewed. Sling applied, ice, pain management and follow up with ortho MDM  54 y.o. male with pain and swelling to the left bicep s/p injury. Stable for d/c without neurovascular compromise. Patient also with elevated  BP. Discussed in detail with the patient need for follow up with ortho for his bicep injury and with his PCP for his elevated BP. Patient voices understanding and agrees with plan.   Final diagnoses:  Injury  Injury of tendon of long head of biceps, left, initial encounter  Elevated BP   I personally performed the services described in this documentation, which was scribed in my presence. The recorded information has been reviewed and is accurate.    [address] [place], [place] 01/02/15 2333  Raeford Razor, MD 01/02/15 [phone]

## 2015-01-02 NOTE — ED Notes (Signed)
Pt refused to wait to sign for discharge and walked out after PA gave him D/C papers

## 2015-01-02 NOTE — ED Notes (Signed)
Pt. reports chronic left upper pain for several weeks worse yesterday after he pulled a door and injured it , presents with pain and mild swelling .

## 2015-01-02 NOTE — ED Notes (Signed)
Pt off unit with CT 

## 2016-08-12 ENCOUNTER — Emergency Department (HOSPITAL_COMMUNITY): Payer: Self-pay

## 2016-08-12 ENCOUNTER — Encounter (HOSPITAL_COMMUNITY): Payer: Self-pay | Admitting: Emergency Medicine

## 2016-08-12 ENCOUNTER — Other Ambulatory Visit (HOSPITAL_COMMUNITY): Payer: Self-pay | Admitting: Radiology

## 2016-08-12 ENCOUNTER — Emergency Department (HOSPITAL_COMMUNITY)
Admission: EM | Admit: 2016-08-12 | Discharge: 2016-08-12 | Disposition: A | Discharge status: Home / Self Care | Payer: Self-pay | Attending: Emergency Medicine | Admitting: Emergency Medicine

## 2016-08-12 DIAGNOSIS — Z5181 Encounter for therapeutic drug level monitoring: Secondary | ICD-10-CM | POA: Insufficient documentation

## 2016-08-12 DIAGNOSIS — R1030 Lower abdominal pain, unspecified: Secondary | ICD-10-CM | POA: Insufficient documentation

## 2016-08-12 DIAGNOSIS — R930 Abnormal findings on diagnostic imaging of skull and head, not elsewhere classified: Secondary | ICD-10-CM | POA: Insufficient documentation

## 2016-08-12 DIAGNOSIS — R0789 Other chest pain: Secondary | ICD-10-CM | POA: Insufficient documentation

## 2016-08-12 DIAGNOSIS — R7989 Other specified abnormal findings of blood chemistry: Secondary | ICD-10-CM

## 2016-08-12 DIAGNOSIS — E871 Hypo-osmolality and hyponatremia: Secondary | ICD-10-CM | POA: Insufficient documentation

## 2016-08-12 DIAGNOSIS — Z79899 Other long term (current) drug therapy: Secondary | ICD-10-CM | POA: Insufficient documentation

## 2016-08-12 DIAGNOSIS — S39012A Strain of muscle, fascia and tendon of lower back, initial encounter: Secondary | ICD-10-CM | POA: Insufficient documentation

## 2016-08-12 DIAGNOSIS — R740 Nonspecific elevation of levels of transaminase and lactic acid dehydrogenase [LDH]: Secondary | ICD-10-CM | POA: Insufficient documentation

## 2016-08-12 DIAGNOSIS — Y999 Unspecified external cause status: Secondary | ICD-10-CM | POA: Insufficient documentation

## 2016-08-12 DIAGNOSIS — Y92410 Unspecified street and highway as the place of occurrence of the external cause: Secondary | ICD-10-CM | POA: Insufficient documentation

## 2016-08-12 DIAGNOSIS — F10120 Alcohol abuse with intoxication, uncomplicated: Secondary | ICD-10-CM | POA: Insufficient documentation

## 2016-08-12 DIAGNOSIS — Y939 Activity, unspecified: Secondary | ICD-10-CM | POA: Insufficient documentation

## 2016-08-12 DIAGNOSIS — S300XXA Contusion of lower back and pelvis, initial encounter: Secondary | ICD-10-CM | POA: Insufficient documentation

## 2016-08-12 DIAGNOSIS — F10920 Alcohol use, unspecified with intoxication, uncomplicated: Secondary | ICD-10-CM

## 2016-08-12 LAB — COMPREHENSIVE METABOLIC PANEL
ALBUMIN: 3.8 g/dL (ref 3.5–5.0)
ALK PHOS: 63 U/L (ref 38–126)
ALT: 37 U/L (ref 17–63)
ANION GAP: 12 (ref 5–15)
AST: 48 U/L — ABNORMAL HIGH (ref 15–41)
BUN: 10 mg/dL (ref 6–20)
CALCIUM: 8.4 mg/dL — AB (ref 8.9–10.3)
CO2: 17 mmol/L — AB (ref 22–32)
Chloride: 100 mmol/L — ABNORMAL LOW (ref 101–111)
Creatinine, Ser: 1.1 mg/dL (ref 0.61–1.24)
GFR calc Af Amer: >60 mL/min (ref >60)
GFR calc non Af Amer: >60 mL/min (ref >60)
GLUCOSE: 105 mg/dL — AB (ref 65–99)
Potassium: 4.9 mmol/L (ref 3.5–5.1)
SODIUM: 129 mmol/L — AB (ref 135–145)
Total Bilirubin: 1.4 mg/dL — ABNORMAL HIGH (ref 0.3–1.2)
Total Protein: 6.7 g/dL (ref 6.5–8.1)

## 2016-08-12 LAB — URINALYSIS, ROUTINE W REFLEX MICROSCOPIC
BILIRUBIN URINE: NEGATIVE
Glucose, UA: NEGATIVE mg/dL
Hgb urine dipstick: NEGATIVE
KETONES UR: NEGATIVE mg/dL
Leukocytes, UA: NEGATIVE
NITRITE: NEGATIVE
PROTEIN: NEGATIVE mg/dL
SPECIFIC GRAVITY, URINE: 1.004 — AB (ref 1.005–1.030)
pH: 5 (ref 5.0–8.0)

## 2016-08-12 LAB — ETHANOL: Alcohol, Ethyl (B): 213 mg/dL — ABNORMAL HIGH (ref <5)

## 2016-08-12 LAB — CDS SEROLOGY

## 2016-08-12 LAB — I-STAT CG4 LACTIC ACID, ED
LACTIC ACID, VENOUS: 2.77 mmol/L — AB (ref 0.5–1.9)
Lactic Acid, Venous: 1.46 mmol/L (ref 0.5–1.9)

## 2016-08-12 LAB — SAMPLE TO BLOOD BANK

## 2016-08-12 LAB — PROTIME-INR
INR: 1.09
Prothrombin Time: 14.1 seconds (ref 11.4–15.2)

## 2016-08-12 MED ORDER — SODIUM CHLORIDE 0.9 % IV BOLUS (SEPSIS)
1000.0000 mL | Freq: Once | INTRAVENOUS | Status: AC
  Administered 2016-08-12: 1000 mL via INTRAVENOUS

## 2016-08-12 MED ORDER — IOPAMIDOL (ISOVUE-300) INJECTION 61%
INTRAVENOUS | Status: AC
  Administered 2016-08-12: 100 mL
  Filled 2016-08-12: qty 100

## 2016-08-12 NOTE — ED Notes (Signed)
Pt constantly trying to get up of bed, pt on spinal precaution ETOH on board no following instructions.

## 2016-08-12 NOTE — ED Notes (Signed)
Fall precaution on place, floor mat, yellow band and socks placed on the pt.

## 2016-08-12 NOTE — Discharge Instructions (Signed)
Take ibuprofen, naproxen, or acetaminophen as needed for pain. ?

## 2016-08-12 NOTE — ED Notes (Signed)
Pt refuses to stay for discharge papers, pt walking out of room and down hall.

## 2016-08-12 NOTE — ED Notes (Signed)
Patient transported to CT 
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
## 2016-08-12 NOTE — ED Notes (Signed)
Pt walk out the ED without dc instructions states he didn't want to wait any longer, pt becoming verbally aggressive and no following instructions. EDP to be notified.

## 2016-08-12 NOTE — ED Notes (Addendum)
Pt able to ambulate on the hallway with slow steady gait, states is painful but he can still ambulate. Pt refuses to wear his yellow socks to ambulate on the hallway. Pt oriented about fall precaution and pt still refuses to wear the yellow socks.
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
## 2019-08-09 ENCOUNTER — Emergency Department (HOSPITAL_COMMUNITY): Payer: Self-pay

## 2019-08-09 ENCOUNTER — Emergency Department (HOSPITAL_COMMUNITY)
Admission: EM | Admit: 2019-08-09 | Discharge: 2019-08-09 | Disposition: A | Discharge status: Home / Self Care | Payer: Self-pay | Attending: Emergency Medicine | Admitting: Emergency Medicine

## 2019-08-09 ENCOUNTER — Other Ambulatory Visit: Payer: Self-pay

## 2019-08-09 ENCOUNTER — Encounter (HOSPITAL_COMMUNITY): Payer: Self-pay | Admitting: Emergency Medicine

## 2019-08-09 DIAGNOSIS — R519 Headache, unspecified: Secondary | ICD-10-CM | POA: Insufficient documentation

## 2019-08-09 DIAGNOSIS — F17210 Nicotine dependence, cigarettes, uncomplicated: Secondary | ICD-10-CM | POA: Insufficient documentation

## 2019-08-09 DIAGNOSIS — R0789 Other chest pain: Secondary | ICD-10-CM | POA: Insufficient documentation

## 2019-08-09 DIAGNOSIS — I160 Hypertensive urgency: Secondary | ICD-10-CM

## 2019-08-09 DIAGNOSIS — Z79899 Other long term (current) drug therapy: Secondary | ICD-10-CM | POA: Insufficient documentation

## 2019-08-09 LAB — COMPREHENSIVE METABOLIC PANEL
ALT: 28 U/L (ref 0–44)
AST: 20 U/L (ref 15–41)
Albumin: 3.1 g/dL — ABNORMAL LOW (ref 3.5–5.0)
Alkaline Phosphatase: 71 U/L (ref 38–126)
Anion gap: 9 (ref 5–15)
BUN: 16 mg/dL (ref 6–20)
CO2: 23 mmol/L (ref 22–32)
Calcium: 8.4 mg/dL — ABNORMAL LOW (ref 8.9–10.3)
Chloride: 102 mmol/L (ref 98–111)
Creatinine, Ser: 1 mg/dL (ref 0.61–1.24)
GFR calc Af Amer: >60 mL/min (ref >60)
GFR calc non Af Amer: >60 mL/min (ref >60)
Glucose, Bld: 103 mg/dL — ABNORMAL HIGH (ref 70–99)
Potassium: 4.5 mmol/L (ref 3.5–5.1)
Sodium: 134 mmol/L — ABNORMAL LOW (ref 135–145)
Total Bilirubin: 0.6 mg/dL (ref 0.3–1.2)
Total Protein: 6.5 g/dL (ref 6.5–8.1)

## 2019-08-09 LAB — CBC
HCT: 43 % (ref 39.0–52.0)
Hemoglobin: 14 g/dL (ref 13.0–17.0)
MCH: 27.8 pg (ref 26.0–34.0)
MCHC: 32.6 g/dL (ref 30.0–36.0)
MCV: 85.3 fL (ref 80.0–100.0)
Platelets: 207 10*3/uL (ref 150–400)
RBC: 5.04 MIL/uL (ref 4.22–5.81)
RDW: 13.2 % (ref 11.5–15.5)
WBC: 9.4 10*3/uL (ref 4.0–10.5)
nRBC: 0 % (ref 0.0–0.2)

## 2019-08-09 LAB — TROPONIN I (HIGH SENSITIVITY): Troponin I (High Sensitivity): 15 ng/L (ref <18)

## 2019-08-09 MED ORDER — ACETAMINOPHEN 500 MG PO TABS
1000.0000 mg | ORAL_TABLET | Freq: Once | ORAL | Status: AC
  Administered 2019-08-09: 1000 mg via ORAL
  Filled 2019-08-09: qty 2

## 2019-08-09 MED ORDER — LABETALOL HCL 5 MG/ML IV SOLN
10.0000 mg | Freq: Once | INTRAVENOUS | Status: AC
  Administered 2019-08-09: 10 mg via INTRAVENOUS
  Filled 2019-08-09: qty 4

## 2019-08-09 MED ORDER — AMLODIPINE BESYLATE 10 MG PO TABS
10.0000 mg | ORAL_TABLET | Freq: Every day | ORAL | 3 refills | Status: DC
Start: 2019-08-09 — End: 2021-08-06

## 2019-08-09 MED ORDER — METOPROLOL SUCCINATE ER 50 MG PO TB24: 50.0000 mg | ORAL_TABLET | Freq: Every day | ORAL | 0 refills | Status: DC

## 2019-08-09 MED ORDER — LISINOPRIL-HYDROCHLOROTHIAZIDE 20-12.5 MG PO TABS: 1.0000 | ORAL_TABLET | Freq: Two times a day (BID) | ORAL | 0 refills | Status: DC

## 2019-08-09 NOTE — ED Triage Notes (Signed)
Patient presents from home via EMS, awake and alert x 4, c/o Elevated blood pressure for the past two weeks. Complaints of headache, blurred vision, ringing in both ears, nausea and vomiting for over a week. Patient relates he ran out of blood pressure medication over one year ago due to lack of insurance coverage.

## 2019-08-09 NOTE — ED Notes (Signed)
Patient verbalizes understanding of discharge instructions. Opportunity for questioning and answers were provided. Armband removed by staff, pt discharged from ED. Pt. ambulatory and discharged home.  

## 2019-08-09 NOTE — Discharge Instructions (Addendum)
Your blood pressure is high today.  Please take blood pressure medications as prescribed.  Call and follow up closely with a primary care provider for further management of your condition.  Return if you have any concerns. Avoid alcohol and tobacco use as it will negatively affect your health.

## 2019-08-09 NOTE — ED Provider Notes (Signed)
[facility] EMERGENCY DEPARTMENT Provider Note   CSN: [id number] Arrival date & time: 08/09/19  1734     History Chief Complaint  Patient presents with  . Hypertension  . Headache  . Blurred Vision  . Tinnitus  . Nausea  . Vomiting    Michael Bell is a 59 y.o. male.  The history is provided by the patient. No language interpreter was used.  Hypertension Associated symptoms include headaches.  Headache    59 year old male with history of hypertension, brought here via EMS for evaluation of high blood pressure.  Patient reported he used to take blood pressure medication but have not been taking it for more than a year.  For the past week he has endorsed gradual onset of progressive worsening headache.  Headache described as a splitting headache in the middle of his head and radiates around the head, persistent, moderate to severe with bilateral blurry vision, feeling nauseous, and overall fatigue.  Also endorsed intermittent chest discomfort but none currently.  No runny nose sneezing or coughing no loss of taste or smell no vomiting or diarrhea, no focal numbness or focal weakness.  He does admits to heavy alcohol use drinking approximately a 12 pack of Bud Light a day as well as heavy tobacco use approximately 2 and half pack a day.  He has not had his Covid vaccine.  He denies any other environmental changes.  Past Medical History:  Diagnosis Date  . Arthritis   . Headache   . Hypertension     Patient Active Problem List   Diagnosis Date Noted  . HNP (herniated nucleus pulposus), lumbar 01/03/2014    Class: Acute  . Chest pain at rest 10/07/2013  . HTN, goal below 140/80 07/22/2013  . Chronic right hip pain 07/22/2013  . Tobacco user 07/22/2013    Past Surgical History:  Procedure Laterality Date  . 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 NOSE SURGERY     40 yrs ago       Family History  Problem Relation Age of Onset  . Ovarian cancer Mother 89       primary peritoneal cancer  . Cancer Mother   . Diabetes Mother   . Heart disease Mother   . Hyperlipidemia Mother   . Hypertension Mother   . Stroke Mother   . Mental illness Mother   . Colon polyps Father        11 on last colonoscopy  . Heart disease Father   . Hyperlipidemia Father   . Hypertension Father   . Stroke Father   . Colon cancer Sister 72       cancer of the sigmoid colon  . COPD Maternal Grandmother   . Stomach cancer Paternal Grandmother   . Down syndrome Paternal Aunt   . Cancer Paternal Uncle        cancer of the spine  . Heart disease Brother   . Hypertension Brother   . Heart disease Brother   . Hyperlipidemia Brother   . Hypertension Brother   . Cancer Brother   . Heart disease Brother   . Mental illness Brother   . Hypertension Brother     Social History   Tobacco Use  . Smoking status: Current Every Day Smoker    Packs/day: 2.00  Years: 38.00    Pack years: 76.00    Types: Cigarettes  . Smokeless tobacco: Never Used  Substance Use Topics  . Alcohol use: Yes  . Drug use: No    Types: Marijuana    Comment: 12/26/13    Home Medications Prior to Admission medications   Medication Sig Start Date End Date Taking? Authorizing Provider  amLODipine (NORVASC) 10 MG tablet Take 1 tablet (10 mg total) by mouth daily. 07/19/13   Maurice March, MD  amLODipine (NORVASC) 10 MG tablet Take 10 mg by mouth daily.    [provider]  Aspirin-Acetaminophen-Caffeine (GOODY HEADACHE PO) Take 1 packet by mouth every 6 (six) hours as needed (for pain).    [provider]  diclofenac (VOLTAREN) 50 MG EC tablet Take 1 tablet (50 mg total) by mouth 2 (two) times daily. 01/02/15   Janne Napoleon, NP  gabapentin (NEURONTIN) 100 MG capsule Take 100 mg by  mouth 3 (three) times daily as needed (for pain).    [provider]  lisinopril-hydrochlorothiazide (PRINZIDE,ZESTORETIC) 20-12.5 MG per tablet Take 1 tablet by mouth 2 (two) times daily.    [provider]  lisinopril-hydrochlorothiazide (PRINZIDE,ZESTORETIC) 20-12.5 MG tablet Take 1 tablet by mouth daily.    [provider]  metoprolol succinate (TOPROL-XL) 50 MG 24 hr tablet Take 50 mg by mouth daily. Take with or immediately following a meal.    [provider]  oxyCODONE-acetaminophen (PERCOCET) 7.5-325 MG tablet Take 1 tablet by mouth every 4 (four) hours as needed for severe pain. 01/02/15   Janne Napoleon, NP    Allergies    Patient has no known allergies.  Review of Systems   Review of Systems  Neurological: Positive for headaches.  All other systems reviewed and are negative.   Physical Exam Updated Vital Signs BP (!) 199/127 (BP Location: Right Arm)   Pulse 69   Temp 97.6 F (36.4 C) (Oral)   Resp 18   Ht 6\' 2"  (1.88 m)   Wt 113.4 kg   SpO2 95%   BMI 32.10 kg/m   Physical Exam Vitals and nursing note reviewed.  Constitutional:      General: He is not in acute distress.    Appearance: He is well-developed.  HENT:     Head: Atraumatic.     Mouth/Throat:     Mouth: Mucous membranes are moist.  Eyes:     Extraocular Movements: Extraocular movements intact.     Conjunctiva/sclera: Conjunctivae normal.     Pupils: Pupils are equal, round, and reactive to light.  Cardiovascular:     Rate and Rhythm: Normal rate and regular rhythm.     Heart sounds: Normal heart sounds.  Pulmonary:     Effort: Pulmonary effort is normal.     Breath sounds: Normal breath sounds.  Abdominal:     General: Bowel sounds are normal.     Palpations: Abdomen is soft.  Musculoskeletal:        General: Normal range of motion.     Cervical back: Normal range of motion and neck supple. No rigidity.  Skin:    Findings: No rash.  Neurological:      Mental Status: He is alert and oriented to person, place, and time.     GCS: GCS eye subscore is 4. GCS verbal subscore is 5. GCS motor subscore is 6.     Cranial Nerves: No cranial nerve deficit.     Sensory: No sensory deficit.  Motor: No weakness.  Psychiatric:        Mood and Affect: Mood normal.     ED Results / Procedures / Treatments   Labs (all labs ordered are listed, but only abnormal results are displayed) Labs Reviewed  COMPREHENSIVE METABOLIC PANEL - Abnormal; Notable for the following components:      Result Value   Sodium 134 (*)    Glucose, Bld 103 (*)    Calcium 8.4 (*)    Albumin 3.1 (*)    All other components within normal limits  SARS CORONAVIRUS 2 BY RT PCR (HOSPITAL ORDER, PERFORMED IN [facility])  CBC  TROPONIN I (HIGH SENSITIVITY)  TROPONIN I (HIGH SENSITIVITY)    EKG None  ED ECG REPORT   Date: 08/09/2019  Rate: 71  Rhythm: normal sinus rhythm  QRS Axis: left  Intervals: QT prolonged  ST/T Wave abnormalities: normal  Conduction Disutrbances:none  Narrative Interpretation:   Old EKG Reviewed: unchanged  I have personally reviewed the EKG tracing and agree with the computerized printout as noted.   Radiology CT Head Wo Contrast  Result Date: 08/09/2019 CLINICAL DATA:  Headache, hypertension EXAM: CT HEAD WITHOUT CONTRAST TECHNIQUE: Contiguous axial images were obtained from the base of the skull through the vertex without intravenous contrast. COMPARISON:  None. FINDINGS: Brain: No acute intracranial abnormality. Specifically, no hemorrhage, hydrocephalus, mass lesion, acute infarction, or significant intracranial injury. Vascular: No hyperdense vessel or unexpected calcification. Skull: No acute calvarial abnormality. Sinuses/Orbits: No acute findings Other: None IMPRESSION: No acute intracranial abnormality. Electronically Signed   By: Charlett Nose M.D.   On: 08/09/2019 19:33   DG Chest Port 1 View  Result Date:  08/09/2019 CLINICAL DATA:  Hypertension EXAM: PORTABLE CHEST 1 VIEW COMPARISON:  08/12/2016 FINDINGS: Cardiomegaly. No confluent airspace opacities, effusions or edema. No acute bony abnormality. IMPRESSION: No active disease. Electronically Signed   By: Charlett Nose M.D.   On: 08/09/2019 18:52    Procedures Procedures (including critical care time)  Medications Ordered in ED Medications  labetalol (NORMODYNE) injection 10 mg (10 mg Intravenous Given 08/09/19 1830)  acetaminophen (TYLENOL) tablet 1,000 mg (1,000 mg Oral Given 08/09/19 2012)    ED Course  I have reviewed the triage vital signs and the nursing notes.  Pertinent labs & imaging results that were available during my care of the patient were reviewed by me and considered in my medical decision making (see chart for details).    MDM Rules/Calculators/A&P                      BP (!) 171/106   Pulse 63   Temp 98.2 F (36.8 C) (Oral)   Resp 18   Ht 6\' 2"  (1.88 m)   Wt 117.9 kg   SpO2 95%   BMI 33.38 kg/m   Final Clinical Impression(s) / ED Diagnoses Final diagnoses:  Hypertensive urgency    Rx / DC Orders ED Discharge Orders         Ordered    amLODipine (NORVASC) 10 MG tablet  Daily     08/09/19 2012    lisinopril-hydrochlorothiazide (ZESTORETIC) 20-12.5 MG tablet  2 times daily     08/09/19 2012    metoprolol succinate (TOPROL-XL) 50 MG 24 hr tablet  Daily     08/09/19 2012         6:10 PM Patient here with concerns of high blood pressure as well as persistent headache ongoing for the past week.  No focal neuro deficit on exam.  He is mentating appropriately.  He was found to be hypertensive with blood pressure of 199/128.  Labetalol given.  Head CT scan ordered.  Work-up initiated.  7:54 PM EKG shows prolonged QT but no other concerning changes labs are reassuring, normal initial troponin, electrolyte panels are reassuring, head CT scan unremarkable, chest x-ray without concerning finding.  Blood pressure  improved with labetalol.  In the setting of headache, blurry vision, and elevated blood pressure, I recommend patient to be admitted for further management of his hypertensive urgency.  Patient however declined admission stating that he cannot afford it.  He prefers to go home instead.  Discussed care with Dr. Alvino Chapel.  Will discharge home with lisinopril and hydrochlorothiazide.  Strongly encouraged patient to follow-up with a primary care provider for further care.  Patient understands his condition may worsen and potentially cause long-term effects such as stroke or death.  He understands he can return at any time for further care.   Domenic Moras, PA-C 08/09/19 2014    Davonna Belling, MD 08/09/19 [phone]

## 2021-01-11 ENCOUNTER — Emergency Department
Admission: EM | Admit: 2021-01-11 | Discharge: 2021-01-11 | Disposition: A | Discharge status: Home / Self Care | Payer: Self-pay | Attending: Emergency Medicine | Admitting: Emergency Medicine

## 2021-01-11 ENCOUNTER — Encounter: Payer: Self-pay | Admitting: Radiology

## 2021-01-11 ENCOUNTER — Other Ambulatory Visit: Payer: Self-pay

## 2021-01-11 ENCOUNTER — Emergency Department: Payer: Self-pay

## 2021-01-11 DIAGNOSIS — F17210 Nicotine dependence, cigarettes, uncomplicated: Secondary | ICD-10-CM | POA: Insufficient documentation

## 2021-01-11 DIAGNOSIS — I10 Essential (primary) hypertension: Secondary | ICD-10-CM | POA: Insufficient documentation

## 2021-01-11 DIAGNOSIS — Z79899 Other long term (current) drug therapy: Secondary | ICD-10-CM | POA: Insufficient documentation

## 2021-01-11 DIAGNOSIS — R0789 Other chest pain: Secondary | ICD-10-CM | POA: Insufficient documentation

## 2021-01-11 LAB — CBC WITH DIFFERENTIAL/PLATELET
Abs Immature Granulocytes: 0.02 10*3/uL (ref 0.00–0.07)
Basophils Absolute: 0.1 10*3/uL (ref 0.0–0.1)
Basophils Relative: 1 %
Eosinophils Absolute: 0.1 10*3/uL (ref 0.0–0.5)
Eosinophils Relative: 2 %
HCT: 44.3 % (ref 39.0–52.0)
Hemoglobin: 15.2 g/dL (ref 13.0–17.0)
Immature Granulocytes: 0 %
Lymphocytes Relative: 37 %
Lymphs Abs: 3.3 10*3/uL (ref 0.7–4.0)
MCH: 29 pg (ref 26.0–34.0)
MCHC: 34.3 g/dL (ref 30.0–36.0)
MCV: 84.5 fL (ref 80.0–100.0)
Monocytes Absolute: 0.8 10*3/uL (ref 0.1–1.0)
Monocytes Relative: 9 %
Neutro Abs: 4.6 10*3/uL (ref 1.7–7.7)
Neutrophils Relative %: 51 %
Platelets: 226 10*3/uL (ref 150–400)
RBC: 5.24 MIL/uL (ref 4.22–5.81)
RDW: 13.4 % (ref 11.5–15.5)
WBC: 8.9 10*3/uL (ref 4.0–10.5)
nRBC: 0 % (ref 0.0–0.2)

## 2021-01-11 LAB — COMPREHENSIVE METABOLIC PANEL
ALT: 33 U/L (ref 0–44)
AST: 27 U/L (ref 15–41)
Albumin: 3.7 g/dL (ref 3.5–5.0)
Alkaline Phosphatase: 90 U/L (ref 38–126)
Anion gap: 7 (ref 5–15)
BUN: 25 mg/dL — ABNORMAL HIGH (ref 6–20)
CO2: 28 mmol/L (ref 22–32)
Calcium: 9.1 mg/dL (ref 8.9–10.3)
Chloride: 101 mmol/L (ref 98–111)
Creatinine, Ser: 0.95 mg/dL (ref 0.61–1.24)
GFR, Estimated: >60 mL/min (ref >60)
Glucose, Bld: 127 mg/dL — ABNORMAL HIGH (ref 70–99)
Potassium: 4 mmol/L (ref 3.5–5.1)
Sodium: 136 mmol/L (ref 135–145)
Total Bilirubin: 0.6 mg/dL (ref 0.3–1.2)
Total Protein: 7.7 g/dL (ref 6.5–8.1)

## 2021-01-11 LAB — BRAIN NATRIURETIC PEPTIDE: B Natriuretic Peptide: 21.4 pg/mL (ref 0.0–100.0)

## 2021-01-11 LAB — TROPONIN I (HIGH SENSITIVITY)
Troponin I (High Sensitivity): 12 ng/L (ref <18)
Troponin I (High Sensitivity): 12 ng/L (ref <18)

## 2021-01-11 MED ORDER — IOHEXOL 350 MG/ML SOLN
75.0000 mL | Freq: Once | INTRAVENOUS | Status: AC | PRN
  Administered 2021-01-11: 75 mL via INTRAVENOUS
  Filled 2021-01-11: qty 75

## 2021-01-11 NOTE — ED Provider Notes (Signed)
[facility] Emergency Department Provider Note   ____________________________________________    I have reviewed the triage vital signs and the nursing notes.   HISTORY  Chief Complaint Chest Pain     HPI Michael Bell is a 60 y.o. male who presents with complaints of chest pain.  Patient reports he has had chest pain "for nearly 6 months "over the last 2 days it has been more persistent so he decided to come in.  No history of MI.  Does not take anything for his blood pressure.  Does have some discomfort with deep inspiration.  No fevers chills or cough.    Past Medical History:  Diagnosis Date   Arthritis    Headache    Hypertension     Patient Active Problem List   Diagnosis Date Noted   HNP (herniated nucleus pulposus), lumbar 01/03/2014    Class: Acute   Chest pain at rest 10/07/2013   HTN, goal below 140/80 07/22/2013   Chronic right hip pain 07/22/2013   Tobacco user 07/22/2013    Past Surgical History:  Procedure Laterality Date   LUMBAR LAMINECTOMY N/A 01/03/2014   Procedure: RIGHT EXTRAFORAMINAL APPROACH TO 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;  Surgeon: Kerrin Champagne, MD;  Location: [facility] OR;  Service: Orthopedics;  Laterality: N/A;   NOSE SURGERY     40 yrs ago    Prior to Admission medications   Medication Sig Start Date End Date Taking? Authorizing Provider  amLODipine (NORVASC) 10 MG tablet Take 1 tablet (10 mg total) by mouth daily. 08/09/19   Fayrene Helper, PA-C  Aspirin-Acetaminophen-Caffeine (GOODY HEADACHE PO) Take 1 packet by mouth every 6 (six) hours as needed (for pain).    [provider]  diclofenac (VOLTAREN) 50 MG EC tablet Take 1 tablet (50 mg total) by mouth 2 (two) times daily. 01/02/15   Janne Napoleon, NP  gabapentin (NEURONTIN) 100 MG capsule Take 100 mg by mouth 3 (three) times daily as needed (for pain).    [provider]  lisinopril-hydrochlorothiazide (ZESTORETIC) 20-12.5 MG  tablet Take 1 tablet by mouth 2 (two) times daily. 08/09/19   Fayrene Helper, PA-C  metoprolol succinate (TOPROL-XL) 50 MG 24 hr tablet Take 1 tablet (50 mg total) by mouth daily. Take with or immediately following a meal. 08/09/19   Fayrene Helper, PA-C  oxyCODONE-acetaminophen (PERCOCET) 7.5-325 MG tablet Take 1 tablet by mouth every 4 (four) hours as needed for severe pain. 01/02/15   Janne Napoleon, NP     Allergies Patient has no known allergies.  Family History  Problem Relation Age of Onset   Ovarian cancer Mother 24       primary peritoneal cancer   Cancer Mother    Diabetes Mother    Heart disease Mother    Hyperlipidemia Mother    Hypertension Mother    Stroke Mother    Mental illness Mother    Colon polyps Father        92 on last colonoscopy   Heart disease Father    Hyperlipidemia Father    Hypertension Father    Stroke Father    Colon cancer Sister 54       cancer of the sigmoid colon   COPD Maternal Grandmother    Stomach cancer Paternal Grandmother    Down syndrome Paternal Aunt    Cancer Paternal Uncle        cancer of the spine   Heart disease Brother  Hypertension Brother    Heart disease Brother    Hyperlipidemia Brother    Hypertension Brother    Cancer Brother    Heart disease Brother    Mental illness Brother    Hypertension Brother     Social History Social History   Tobacco Use   Smoking status: Every Day    Packs/day: 2.00    Years: 38.00    Pack years: 76.00    Types: Cigarettes   Smokeless tobacco: Never  Substance Use Topics   Alcohol use: Yes   Drug use: No    Types: Marijuana    Comment: 12/26/13    Review of Systems  Constitutional: No fever/chills Eyes: No visual changes.  ENT: No sore throat. Cardiovascular: Denies chest pain. Respiratory: Denies shortness of breath. Gastrointestinal: No abdominal pain.  No nausea, no vomiting.   Genitourinary: Negative for dysuria. Musculoskeletal: Negative for back pain. Skin: Negative  for rash. Neurological: Negative for headaches    ____________________________________________   PHYSICAL EXAM:  VITAL SIGNS: ED Triage Vitals  Enc Vitals Group     BP 01/11/21 1622 (!) 193/150     Pulse Rate 01/11/21 1622 77     Resp 01/11/21 1622 20     Temp 01/11/21 1622 98.4 F (36.9 C)     Temp Source 01/11/21 1622 Oral     SpO2 01/11/21 1622 98 %     Weight 01/11/21 1623 113.4 kg (250 lb)     Height 01/11/21 1623 1.905 m (6\' 3" )     Head Circumference --      Peak Flow --      Pain Score 01/11/21 1623 8     Pain Loc --      Pain Edu? --      Excl. in GC? --     Constitutional: Alert and oriented. No acute distress. Pleasant and interactive  Nose: No congestion/rhinnorhea. Mouth/Throat: Mucous membranes are moist.    Cardiovascular: Normal rate, regular rhythm. Grossly normal heart sounds.  Good peripheral circulation. Respiratory: Normal respiratory effort.  No retractions. Lungs CTAB. Gastrointestinal: Soft and nontender. No distention.  .  Musculoskeletal: No lower extremity tenderness nor edema.  Warm and well perfused Neurologic:  Normal speech and language. No gross focal neurologic deficits are appreciated.  Skin:  Skin is warm, dry and intact. No rash noted. Psychiatric: Mood and affect are normal. Speech and behavior are normal.  ____________________________________________   LABS (all labs ordered are listed, but only abnormal results are displayed)  Labs Reviewed  COMPREHENSIVE METABOLIC PANEL - Abnormal; Notable for the following components:      Result Value   Glucose, Bld 127 (*)    BUN 25 (*)    All other components within normal limits  CBC WITH DIFFERENTIAL/PLATELET  BRAIN NATRIURETIC PEPTIDE  TROPONIN I (HIGH SENSITIVITY)  TROPONIN I (HIGH SENSITIVITY)   ____________________________________________  EKG  ED ECG REPORT I, 01/13/21, the attending physician, personally viewed and interpreted this ECG.  Date:  01/11/2021   Rhythm: normal sinus rhythm QRS Axis: normal Intervals: normal ST/T Wave abnormalities: normal Narrative Interpretation: no evidence of acute ischemia  ____________________________________________  RADIOLOGY  Chest x-ray read by me, no acute abnormality CT angio chest, no PE ____________________________________________   PROCEDURES  Procedure(s) performed: No  Procedures   Critical Care performed: No ____________________________________________   INITIAL IMPRESSION / ASSESSMENT AND PLAN / ED COURSE  Pertinent labs & imaging results that were available during my care of the patient were reviewed  by me and considered in my medical decision making (see chart for details).   Patient presents with chest pain as described above.  Differential includes angina, less likely MI given reassuring EKG, no rales on exam, doubt CHF  Chest x-ray reassuring, no edema or pneumonia  Will check troponin x2, sent for CT angio given pain with deep inspiration  Delta troponin unremarkable, patient feeling improved, will discharge with close follow-up with cardiology, strict return precautions discussed.       ____________________________________________   FINAL CLINICAL IMPRESSION(S) / ED DIAGNOSES  Final diagnoses:  Atypical chest pain        Note:  This document was prepared using Dragon voice recognition software and may include unintentional dictation errors.    Jene Every, MD 01/11/21 2142

## 2021-01-11 NOTE — ED Provider Notes (Signed)
Emergency Medicine Provider Triage Evaluation Note  Michael Bell , a 60 y.o. male  was evaluated in triage.  Pt presents to the emergency department with left-sided chest pain that radiates to the back.  Patient characterizes left chest pain as sudden onset, sharp and just below his left breast.  He states that pain has been occurring for the past 2 days and he decided to try a friend's blood pressure medication and Lasix.  Denies a history of CHF and has never been on Lasix before.  Denies fever or chills at home.  States that he smokes at least a pack of cigarettes a day.  No recent travel or prolonged immobilization.  Denies similar chest pain in the past.  Review of Systems  Positive: Patient has left sided chest pain.  Negative: No nausea, vomiting or abdominal pain.   Physical Exam  There were no vitals taken for this visit. Gen:   Awake, no distress   Resp:  Normal effort  MSK:   Moves extremities without difficulty  Other:    Medical Decision Making  Medically screening exam initiated at 4:19 PM.  Appropriate orders placed.  [name] was informed that the remainder of the evaluation will be completed by another provider, this initial triage assessment does not replace that evaluation, and the importance of remaining in the ED until their evaluation is complete.     Pia Mau [place], PA-C 01/11/21 1621    Jene Every, MD 01/11/21 1640

## 2021-01-11 NOTE — ED Triage Notes (Signed)
Pt reports chest pain for the past 2 days, pt states that his bp has been elevated at home and he has been taking his wife's lasix and bp medicine, pt denies ever being evaluated for heart or bp issues, pt reports left chest, left scapulae, and left arm pain

## 2021-08-04 ENCOUNTER — Inpatient Hospital Stay (HOSPITAL_COMMUNITY)
Admission: EM | Admit: 2021-08-04 | Discharge: 2021-08-06 | DRG: 280 | Disposition: A | Discharge status: Home / Self Care | Payer: 59 | Attending: Internal Medicine | Admitting: Internal Medicine

## 2021-08-04 ENCOUNTER — Other Ambulatory Visit: Payer: Self-pay

## 2021-08-04 ENCOUNTER — Emergency Department (HOSPITAL_COMMUNITY): Payer: 59

## 2021-08-04 ENCOUNTER — Encounter (HOSPITAL_COMMUNITY): Payer: Self-pay | Admitting: *Deleted

## 2021-08-04 DIAGNOSIS — M1990 Unspecified osteoarthritis, unspecified site: Secondary | ICD-10-CM | POA: Diagnosis present

## 2021-08-04 DIAGNOSIS — D649 Anemia, unspecified: Secondary | ICD-10-CM | POA: Diagnosis present

## 2021-08-04 DIAGNOSIS — J439 Emphysema, unspecified: Secondary | ICD-10-CM | POA: Diagnosis present

## 2021-08-04 DIAGNOSIS — R0602 Shortness of breath: Secondary | ICD-10-CM | POA: Diagnosis not present

## 2021-08-04 DIAGNOSIS — I255 Ischemic cardiomyopathy: Secondary | ICD-10-CM | POA: Diagnosis present

## 2021-08-04 DIAGNOSIS — Z20822 Contact with and (suspected) exposure to covid-19: Secondary | ICD-10-CM | POA: Diagnosis present

## 2021-08-04 DIAGNOSIS — I110 Hypertensive heart disease with heart failure: Principal | ICD-10-CM | POA: Diagnosis present

## 2021-08-04 DIAGNOSIS — I10 Essential (primary) hypertension: Secondary | ICD-10-CM | POA: Diagnosis present

## 2021-08-04 DIAGNOSIS — I509 Heart failure, unspecified: Secondary | ICD-10-CM

## 2021-08-04 DIAGNOSIS — I25118 Atherosclerotic heart disease of native coronary artery with other forms of angina pectoris: Secondary | ICD-10-CM | POA: Diagnosis present

## 2021-08-04 DIAGNOSIS — I700 Atherosclerosis of aorta: Secondary | ICD-10-CM | POA: Diagnosis present

## 2021-08-04 DIAGNOSIS — E785 Hyperlipidemia, unspecified: Secondary | ICD-10-CM | POA: Diagnosis present

## 2021-08-04 DIAGNOSIS — I214 Non-ST elevation (NSTEMI) myocardial infarction: Secondary | ICD-10-CM | POA: Diagnosis present

## 2021-08-04 DIAGNOSIS — I160 Hypertensive urgency: Secondary | ICD-10-CM | POA: Diagnosis present

## 2021-08-04 DIAGNOSIS — R011 Cardiac murmur, unspecified: Secondary | ICD-10-CM | POA: Diagnosis present

## 2021-08-04 DIAGNOSIS — Z79899 Other long term (current) drug therapy: Secondary | ICD-10-CM

## 2021-08-04 DIAGNOSIS — I161 Hypertensive emergency: Secondary | ICD-10-CM | POA: Diagnosis present

## 2021-08-04 DIAGNOSIS — J810 Acute pulmonary edema: Secondary | ICD-10-CM

## 2021-08-04 DIAGNOSIS — Z8249 Family history of ischemic heart disease and other diseases of the circulatory system: Secondary | ICD-10-CM

## 2021-08-04 DIAGNOSIS — I200 Unstable angina: Secondary | ICD-10-CM

## 2021-08-04 DIAGNOSIS — R7989 Other specified abnormal findings of blood chemistry: Secondary | ICD-10-CM | POA: Diagnosis present

## 2021-08-04 DIAGNOSIS — R0603 Acute respiratory distress: Secondary | ICD-10-CM | POA: Diagnosis present

## 2021-08-04 DIAGNOSIS — I5031 Acute diastolic (congestive) heart failure: Secondary | ICD-10-CM | POA: Diagnosis present

## 2021-08-04 DIAGNOSIS — M5126 Other intervertebral disc displacement, lumbar region: Secondary | ICD-10-CM | POA: Diagnosis present

## 2021-08-04 DIAGNOSIS — F17210 Nicotine dependence, cigarettes, uncomplicated: Secondary | ICD-10-CM | POA: Diagnosis present

## 2021-08-04 DIAGNOSIS — Z825 Family history of asthma and other chronic lower respiratory diseases: Secondary | ICD-10-CM

## 2021-08-04 DIAGNOSIS — G4700 Insomnia, unspecified: Secondary | ICD-10-CM | POA: Diagnosis present

## 2021-08-04 DIAGNOSIS — Z91141 Patient's other noncompliance with medication regimen due to financial hardship: Secondary | ICD-10-CM

## 2021-08-04 DIAGNOSIS — F1010 Alcohol abuse, uncomplicated: Secondary | ICD-10-CM | POA: Diagnosis present

## 2021-08-04 DIAGNOSIS — Z23 Encounter for immunization: Secondary | ICD-10-CM

## 2021-08-04 DIAGNOSIS — I7143 Infrarenal abdominal aortic aneurysm, without rupture: Secondary | ICD-10-CM | POA: Diagnosis present

## 2021-08-04 DIAGNOSIS — I5043 Acute on chronic combined systolic (congestive) and diastolic (congestive) heart failure: Principal | ICD-10-CM | POA: Diagnosis present

## 2021-08-04 LAB — CBC WITH DIFFERENTIAL/PLATELET
Abs Immature Granulocytes: 0.02 10*3/uL (ref 0.00–0.07)
Basophils Absolute: 0.1 10*3/uL (ref 0.0–0.1)
Basophils Relative: 1 %
Eosinophils Absolute: 0 10*3/uL (ref 0.0–0.5)
Eosinophils Relative: 0 %
HCT: 40.1 % (ref 39.0–52.0)
Hemoglobin: 12.7 g/dL — ABNORMAL LOW (ref 13.0–17.0)
Immature Granulocytes: 0 %
Lymphocytes Relative: 15 %
Lymphs Abs: 1.3 10*3/uL (ref 0.7–4.0)
MCH: 26.5 pg (ref 26.0–34.0)
MCHC: 31.7 g/dL (ref 30.0–36.0)
MCV: 83.7 fL (ref 80.0–100.0)
Monocytes Absolute: 0.6 10*3/uL (ref 0.1–1.0)
Monocytes Relative: 7 %
Neutro Abs: 6.2 10*3/uL (ref 1.7–7.7)
Neutrophils Relative %: 77 %
Platelets: 226 10*3/uL (ref 150–400)
RBC: 4.79 MIL/uL (ref 4.22–5.81)
RDW: 13.7 % (ref 11.5–15.5)
WBC: 8.2 10*3/uL (ref 4.0–10.5)
nRBC: 0 % (ref 0.0–0.2)

## 2021-08-04 LAB — BLOOD GAS, VENOUS
Acid-Base Excess: 4.7 mmol/L — ABNORMAL HIGH (ref 0.0–2.0)
Bicarbonate: 28.2 mmol/L — ABNORMAL HIGH (ref 20.0–28.0)
Drawn by: 6775
O2 Saturation: 59.9 %
Patient temperature: 36.7
pCO2, Ven: 37 mmHg — ABNORMAL LOW (ref 44–60)
pH, Ven: 7.49 — ABNORMAL HIGH (ref 7.25–7.43)
pO2, Ven: 34 mmHg (ref 32–45)

## 2021-08-04 LAB — BASIC METABOLIC PANEL
Anion gap: 5 (ref 5–15)
BUN: 15 mg/dL (ref 6–20)
CO2: 23 mmol/L (ref 22–32)
Calcium: 8.6 mg/dL — ABNORMAL LOW (ref 8.9–10.3)
Chloride: 111 mmol/L (ref 98–111)
Creatinine, Ser: 1.03 mg/dL (ref 0.61–1.24)
GFR, Estimated: >60 mL/min (ref >60)
Glucose, Bld: 122 mg/dL — ABNORMAL HIGH (ref 70–99)
Potassium: 3.9 mmol/L (ref 3.5–5.1)
Sodium: 139 mmol/L (ref 135–145)

## 2021-08-04 LAB — RESP PANEL BY RT-PCR (FLU A&B, COVID) ARPGX2
Influenza A by PCR: NEGATIVE
Influenza B by PCR: NEGATIVE
SARS Coronavirus 2 by RT PCR: NEGATIVE

## 2021-08-04 LAB — RAPID URINE DRUG SCREEN, HOSP PERFORMED
Amphetamines: NOT DETECTED
Barbiturates: NOT DETECTED
Benzodiazepines: NOT DETECTED
Cocaine: NOT DETECTED
Opiates: NOT DETECTED
Tetrahydrocannabinol: POSITIVE — AB

## 2021-08-04 LAB — ETHANOL: Alcohol, Ethyl (B): <10 mg/dL (ref <10)

## 2021-08-04 LAB — MAGNESIUM: Magnesium: 1.8 mg/dL (ref 1.7–2.4)

## 2021-08-04 LAB — HEMOGLOBIN A1C
Hgb A1c MFr Bld: 5.6 % (ref 4.8–5.6)
Mean Plasma Glucose: 114.02 mg/dL

## 2021-08-04 LAB — LDL CHOLESTEROL, DIRECT: Direct LDL: 93.6 mg/dL (ref 0–99)

## 2021-08-04 LAB — HIV ANTIBODY (ROUTINE TESTING W REFLEX): HIV Screen 4th Generation wRfx: NONREACTIVE

## 2021-08-04 LAB — TSH: TSH: 0.644 u[IU]/mL (ref 0.350–4.500)

## 2021-08-04 LAB — TROPONIN I (HIGH SENSITIVITY)
Troponin I (High Sensitivity): 22 ng/L — ABNORMAL HIGH (ref <18)
Troponin I (High Sensitivity): 22 ng/L — ABNORMAL HIGH (ref <18)

## 2021-08-04 LAB — BRAIN NATRIURETIC PEPTIDE: B Natriuretic Peptide: 550.5 pg/mL — ABNORMAL HIGH (ref 0.0–100.0)

## 2021-08-04 MED ORDER — IPRATROPIUM-ALBUTEROL 0.5-2.5 (3) MG/3ML IN SOLN
3.0000 mL | Freq: Once | RESPIRATORY_TRACT | Status: AC
  Administered 2021-08-04: 3 mL via RESPIRATORY_TRACT
  Filled 2021-08-04: qty 3

## 2021-08-04 MED ORDER — FUROSEMIDE 10 MG/ML IJ SOLN
40.0000 mg | Freq: Two times a day (BID) | INTRAMUSCULAR | Status: DC
  Administered 2021-08-04 – 2021-08-05 (×3): 40 mg via INTRAVENOUS
  Filled 2021-08-04 (×4): qty 4

## 2021-08-04 MED ORDER — ACETAMINOPHEN 650 MG RE SUPP: 650.0000 mg | Freq: Four times a day (QID) | RECTAL | Status: DC | PRN

## 2021-08-04 MED ORDER — GUAIFENESIN 100 MG/5ML PO LIQD
5.0000 mL | ORAL | Status: DC | PRN
  Administered 2021-08-04 – 2021-08-05 (×3): 5 mL via ORAL
  Filled 2021-08-04 (×3): qty 10

## 2021-08-04 MED ORDER — FUROSEMIDE 10 MG/ML IJ SOLN
40.0000 mg | Freq: Once | INTRAMUSCULAR | Status: AC
  Administered 2021-08-04: 40 mg via INTRAVENOUS
  Filled 2021-08-04: qty 4

## 2021-08-04 MED ORDER — FUROSEMIDE 10 MG/ML IJ SOLN: 40.0000 mg | Freq: Every day | INTRAMUSCULAR | Status: DC

## 2021-08-04 MED ORDER — ASPIRIN EC 81 MG PO TBEC
81.0000 mg | DELAYED_RELEASE_TABLET | Freq: Every day | ORAL | Status: DC
  Administered 2021-08-05 – 2021-08-06 (×2): 81 mg via ORAL
  Filled 2021-08-04: qty 1

## 2021-08-04 MED ORDER — SODIUM CHLORIDE 0.9 % IV SOLN
500.0000 mg | Freq: Once | INTRAVENOUS | Status: AC
  Administered 2021-08-04: 500 mg via INTRAVENOUS
  Filled 2021-08-04: qty 5

## 2021-08-04 MED ORDER — SENNOSIDES-DOCUSATE SODIUM 8.6-50 MG PO TABS: 1.0000 | ORAL_TABLET | Freq: Every evening | ORAL | Status: DC | PRN

## 2021-08-04 MED ORDER — ATORVASTATIN CALCIUM 80 MG PO TABS
80.0000 mg | ORAL_TABLET | Freq: Every day | ORAL | Status: DC
  Administered 2021-08-04 – 2021-08-05 (×2): 80 mg via ORAL
  Filled 2021-08-04 (×2): qty 1

## 2021-08-04 MED ORDER — METHYLPREDNISOLONE SODIUM SUCC 125 MG IJ SOLR
125.0000 mg | Freq: Once | INTRAMUSCULAR | Status: AC
Start: 2021-08-04 — End: 2021-08-04
  Administered 2021-08-04: 125 mg via INTRAVENOUS
  Filled 2021-08-04: qty 2

## 2021-08-04 MED ORDER — HEPARIN BOLUS VIA INFUSION
4000.0000 [IU] | Freq: Once | INTRAVENOUS | Status: AC
  Administered 2021-08-04: 4000 [IU] via INTRAVENOUS
  Filled 2021-08-04: qty 4000

## 2021-08-04 MED ORDER — ACETAMINOPHEN 325 MG PO TABS
650.0000 mg | ORAL_TABLET | Freq: Four times a day (QID) | ORAL | Status: DC | PRN
  Administered 2021-08-04 – 2021-08-05 (×2): 650 mg via ORAL
  Filled 2021-08-04 (×2): qty 2

## 2021-08-04 MED ORDER — ENOXAPARIN SODIUM 40 MG/0.4ML IJ SOSY
40.0000 mg | PREFILLED_SYRINGE | INTRAMUSCULAR | Status: DC
  Administered 2021-08-04: 40 mg via SUBCUTANEOUS
  Filled 2021-08-04: qty 0.4

## 2021-08-04 MED ORDER — CARVEDILOL 6.25 MG PO TABS
6.2500 mg | ORAL_TABLET | Freq: Two times a day (BID) | ORAL | Status: DC
  Administered 2021-08-05: 6.25 mg via ORAL
  Filled 2021-08-04: qty 1

## 2021-08-04 MED ORDER — ONDANSETRON HCL 4 MG PO TABS: 4.0000 mg | ORAL_TABLET | Freq: Four times a day (QID) | ORAL | Status: DC | PRN

## 2021-08-04 MED ORDER — PREDNISONE 20 MG PO TABS: 40.0000 mg | ORAL_TABLET | Freq: Every day | ORAL | Status: DC

## 2021-08-04 MED ORDER — ONDANSETRON HCL 4 MG/2ML IJ SOLN: 4.0000 mg | Freq: Four times a day (QID) | INTRAMUSCULAR | Status: DC | PRN

## 2021-08-04 MED ORDER — SODIUM CHLORIDE 0.9 % IV SOLN
1.0000 g | INTRAVENOUS | Status: DC
  Administered 2021-08-04: 1 g via INTRAVENOUS
  Filled 2021-08-04: qty 10

## 2021-08-04 MED ORDER — HEPARIN (PORCINE) 25000 UT/250ML-% IV SOLN
1700.0000 [IU]/h | INTRAVENOUS | Status: DC
  Administered 2021-08-04: 1450 [IU]/h via INTRAVENOUS
  Filled 2021-08-04: qty 250

## 2021-08-04 MED ORDER — NITROGLYCERIN IN D5W 200-5 MCG/ML-% IV SOLN
0.0000 ug/min | INTRAVENOUS | Status: DC
  Administered 2021-08-04: 5 ug/min via INTRAVENOUS
  Filled 2021-08-04: qty 250

## 2021-08-04 MED ORDER — IPRATROPIUM-ALBUTEROL 0.5-2.5 (3) MG/3ML IN SOLN
3.0000 mL | RESPIRATORY_TRACT | Status: DC | PRN
  Administered 2021-08-04 – 2021-08-05 (×2): 3 mL via RESPIRATORY_TRACT
  Filled 2021-08-04 (×2): qty 3

## 2021-08-04 MED ORDER — LOSARTAN POTASSIUM 25 MG PO TABS
25.0000 mg | ORAL_TABLET | Freq: Every day | ORAL | Status: DC
  Administered 2021-08-04 – 2021-08-05 (×2): 25 mg via ORAL
  Filled 2021-08-04 (×2): qty 1

## 2021-08-04 MED ORDER — HYDRALAZINE HCL 20 MG/ML IJ SOLN
5.0000 mg | Freq: Once | INTRAMUSCULAR | Status: AC
  Administered 2021-08-04: 5 mg via INTRAVENOUS
  Filled 2021-08-04: qty 1

## 2021-08-04 NOTE — ED Triage Notes (Signed)
Pt reports having sob and cp since yesterday. Has productive cough and feels like he is unable to clear his chest. Denies swelling. Reports unable to sleep. Hx of htn but out of his meds for over the past year.  ?

## 2021-08-04 NOTE — ED Provider Triage Note (Signed)
Emergency Medicine Provider Triage Evaluation Note ? ?Michael Bell , a 61 y.o. male  was evaluated in triage.  Pt complains of sob. Increased productive cough, wheeze, and shob for more than a week.  Subjective fever.  Heavy tobacco use, 2pk/day.  Sharp chest pain.  No dizziness, nausea, or diaphoresis ? ?Review of Systems  ?Positive: As above ?Negative: As above ? ?Physical Exam  ?BP (!) 198/123 (BP Location: Right Arm)   Pulse 92   Temp 97.6 ?F (36.4 ?C) (Oral)   Resp (!) 22   SpO2 92%  ?Gen:   Awake, no distress   ?Resp:  Normal effort  ?MSK:   Moves extremities without difficulty  ?Other:  Decreased breath sounds with inspiratory and expiratory wheezes ? ?Medical Decision Making  ?Medically screening exam initiated at 11:28 AM.  Appropriate orders placed.  Michael Bell was informed that the remainder of the evaluation will be completed by another provider, this initial triage assessment does not replace that evaluation, and the importance of remaining in the ED until their evaluation is complete. ? ? ?  ?Fayrene Helper, PA-C ?08/04/21 1132 ? ?

## 2021-08-04 NOTE — H&P (Signed)
?Date: 08/04/2021     ?     ?     ?Patient Name:  Michael Bell MRN: [id number]  ?DOB: Apr 13, 1960 Age / Sex: 61 y.o., male   ?PCP: Patient, No Pcp Per (Inactive)    ?     ?     ?Medical Service: Internal Medicine Teaching Service    ?     ?     ?Attending Physician: Dr. Rayne Du att. providers found    ?First Contact: Pollyann Savoy, MS 4 Pager: [phone]  ?Second Contact: Dr. Coy Saunas Pager: [phone]  ?     ?     ?After Hours (After 5p/  First Contact Pager: [phone]  ?weekends / holidays): Second Contact Pager: [phone]  ? ?Chief Complaint: shortness of breath ? ?History of Present Illness: This is a 61 year old male with history of uncontrolled hypertension and tobacco use disorder who presents with progressively worsening shortness of breath.  ? ?Patient reports that 2 weeks ago he started feeling ill with rhinorrhea, headache, decreased energy, and occasional chills but no true fevers. He has had a cough with clear-yellow phlegm production over the past 3-4 weeks but has not had chronic cough prior to this. Over the past two weeks, he has had progressive dyspnea, orthopnea, and peripheral nocturnal dyspnea. His shortness of breath is worst first thing in the morning and with exertion but does persist throughout the day. Sitting upright results in mild pain improvement. He reports sleeping with 2-3 pillows at baseline but has recently needed to sleep upright in a recliner due to orthopnea. He is particularly bothered by his inability to sleep due to PND and reports that he can only rest for 2-3 hours at a time at night. He has noticed bilateral lower extremity edema, which is worst at the end of the day.  He has not been checking regularly but thinks he has lost some weight from 240 lb to 220-230 lb. Patient reports a heavy smoking history with 1.5-2 PPD for the past 50 years. Endorses decreased appetite over the past two months but denies nausea / vomiting / diarrhea / constipation / abdominal pain. ? ?He endorses chest  pain which he describes as a substernal pressure / weight sensation that comes on at unpredictable times and lasts for varying periods of time. He is unable to identify aggravating or alleviating factors but does note that he has been more stressed than usual lately due to his marriage. He also endorses a different pleuritic chest pain that has been present for the past two weeks.  ? ?Denies any prior episodes of shortness of breath like this or history of heart failure, asthma, COPD, or OSA but does report that he avoids doctors as much as possible and has not been seen by a PCP in > 1 year. He also stopped taking all of his medications > 1 year ago when he lost insurance.  ? ?In the ED, he was tachypneic and hypertensive with other vitals within normal limits. CXR notable for interstitial edema and small bilateral pleural effusions. BNP and troponin elevated. CBC and BMP stable. He was treated for COPD exacerbation with azithromycin and methylprednisolone and CHF exacerbation with IV lasix. Blood pressure was managed with IV hydralazine.  ? ?Meds:  ?None currently ? ?Allergies: NKDA ? ?Past Medical History:  ?Diagnosis Date  ? Arthritis   ? Headache   ? Hypertension   ? ? ?Family History:  ?Mom- T2DM, HTN, stomach cancer ?Dad - HTN, COPD,  CAD ?Sister - stomach cancer, in remission ? ?Social History: Patient lives in a house in [place] with his 6 dogs and 3 cats. Recently, his wife and stepdaughter have moved back in with him and since then he has had increased stress. He works as a Nature conservation officer and is currently uninsured and unable to afford any of his medications. He is able to perform ADLs and IADLs independently but endorses shortness of breath with climbing one flight of stairs and walking for extended distances on a flat surface. He does not use oxygen at home. Endorses smoking 1.5-2 PPD for the past 50 years. Reports drinking a 12 pack of Bud Light daily for a long time. He also drinks liquor but  is unable to clarify how much. Also endorses marijuana use intermittently. Denies use of opioids, electronic cigarettes, hallucinogens, or inhalants.  ? ?Review of Systems: ?A complete ROS was negative except as per HPI.  ? ?Physical Exam: ?Blood pressure (!) 173/131, pulse 89, temperature 98 ?F (36.7 ?C), temperature source Oral, resp. rate (!) 24, height 6\' 3"  (1.905 m), weight 108.9 kg, SpO2 97 %. ? ?General: Lying in bed without any acute distress but becoming dyspneic after long sentences ?Neuro: A&O x3, normal affect ?HEENT: Normocephalic, atraumatic, EOMI, normal sclerae without scleral icterus or injection. Moist mucous membranes. Clear oropharynx ?Cardiovascular: Distant heart sounds but no obvious murmurs. Radial and DP pulses 2+ bilaterally. 2+ pitting edema to mid-calf bilaterally.  ?Pulmonary: Bibasilar crackles. Decreased air movement at bilateral apices. No wheezing. No clubbing.   ?Abdominal: Abdomen soft and non-distended. Normoactive bowel sounds. No tenderness to palpation.  ?Skin: Erythema over bilateral knees. Left knee with overlying white plaques with scaling.  ?MSK: Normal ROM of all extremities. Strength 5/5 in bilateral UE and LE ? ? ?EKG: personally reviewed my interpretation is normal sinus rhythm with lateral T wave inversions ? ?CXR: personally reviewed my interpretation is increased interstitial markings with small bilateral pleural effusions ? ?Assessment & Plan by Problem: ?Principal Problem: ?  Acute exacerbation of congestive heart failure ([place]) ?Active Problems: ?  HTN, goal below 140/80 ?  HNP (herniated nucleus pulposus), lumbar ? ?This is a 61 year old male with history of uncontrolled hypertension and tobacco use disorder who presents with progressively worsening shortness of breath, concerning for CHF exacerbation and COPD.  ? ?Shortness of breath, concerning for CHF exacerbation versus COPD exacerbation  ?Patient with history of smoking and uncontrolled hypertension presents  with new onset dyspnea with pleuritic chest pain following a respiratory illness and has associated orthopnea, PND, and peripheral edema. Bibasilar crackles heard on exam. CXR on admission notable for interstitial edema and small bilateral pleural effusions. BNP 551. Differential includes CHF exacerbation given signs of volume overload on exam vs COPD exacerbation given smoking history and symptoms triggered by respiratory illness. No signs of pneumonia.  ?- Echo ?- ABG ?- IV lasix 40 mg daily ?- Prednisone 40 mg daily x 4 days ?- PRN Duo nebs, Guaifenesin ?- Discontinue ceftriaxone and azithromycin but can consider restarting azithromycin if worsening clinically  ?- Strict I/Os and daily weights ?- TSH ? ?Substernal chest pain, concerning for unstable angina ?Patient with uncontrolled chronic hypertension presents with history of substernal, non-radiating, pressure-like chest pain without identifiable aggravating or alleviating factors and occasionally occurring at rest. EKG with sinus rhythm, LVH, and lateral T-wave inversions but no ST elevations or depressions. Troponin 22. Unclear etiology at this time but concerning for unstable angina vs hypertensive heart disease. T wave inversions  concerning for ischemia but could also be secondary to repolarization abnormalities in the setting of hypertensive cardiomyopathy.  ?- Cardiology consult, will see tomorrow ?- Echo ?- Trend troponin ?- PRN tylenol  ?- Consider stress test pending results of echo ? ?Severe symptomatic hypertension ?Patient with chronic uncontrolled hypertension presents with BP 198/123. Endorsing substernal chest pain. Troponin 22. EKG with LVH and lateral T wave inversions. Patient reports being off all antihypertensives for the past >1 year since losing insurance.  ?- Start losartan and carvedilol to slowly decrease BP ?- Continue diuresis ?- Hb A1c and lipid panel ? ?Anemia, mild ?- Continue to monitor ? ?Tobacco use disorder  ?- Encourage  smoking cessation ?- Nicotine patch ? ?CODE STATUS: Full code ?Diet: Heart healthy  ?DVT prophylaxis: enoxaparin  ? ? ?Dispo: Admit patient to Observation with expected length of stay less than 2 midnights.

## 2021-08-04 NOTE — ED Provider Notes (Signed)
?[facility] EMERGENCY DEPARTMENT ?Provider Note ? ? ?CSN: [id number] ?Arrival date & time: 08/04/21  1058 ? ?  ? ?History ? ?Chief Complaint  ?Patient presents with  ? Shortness of Breath  ? ? ?Michael Bell is a 61 y.o. male with uncontrolled hypertension and tobacco use disorder who presents to the ED with shortness of breath. The patient states that four days ago he woke up suddenly and was gasping for air. ROS positive for orthopnea and PND. His shortness of breath has been progressively worsening since then and his boss made him come to the ED today for evaluation (patient works in [employer]). The patient feels short of breath at rest and while conversing. He also states that he has had intermittent chest pain, which he describes as a pressure and something pushing on his chest. He is unsure how long these episodes last when they do occur. The patient also endorses subjective fevers and has chills. He has had a productive cough. Over the last four days he has also had a headache and blurry vision, which is not usual for him. Patient denies any dizziness, abd pain, n/v/d, melena, hematochezia, or any urinary symptoms.  ? ?The history is provided by the patient. No language interpreter was used.  ?Shortness of Breath ?Severity:  Severe ?Onset quality:  Sudden ?Duration:  4 days ?Timing:  Constant ?Progression:  Worsening ?Chronicity:  New ?Context: activity   ?Relieved by:  Nothing ?Worsened by:  Exertion, movement and activity ?Associated symptoms: chest pain, cough, headaches, PND and sputum production   ?Associated symptoms: no abdominal pain, no fever, no hemoptysis, no rash, no sore throat, no syncope and no vomiting   ?Chest pain:  ?  Quality: pressure   ?  Severity:  Moderate ?  Timing:  Intermittent ?  Progression:  Unable to specify ?  Chronicity:  New ? ?  ? ?Home Medications ?Prior to Admission medications   ?Medication Sig Start Date End Date Taking? Authorizing Provider   ?amLODipine (NORVASC) 10 MG tablet Take 1 tablet (10 mg total) by mouth daily. 08/09/19   Fayrene Helper, PA-C  ?Aspirin-Acetaminophen-Caffeine (GOODY HEADACHE PO) Take 1 packet by mouth every 6 (six) hours as needed (for pain).    [provider]  ?diclofenac (VOLTAREN) 50 MG EC tablet Take 1 tablet (50 mg total) by mouth 2 (two) times daily. 01/02/15   Janne Napoleon, NP  ?gabapentin (NEURONTIN) 100 MG capsule Take 100 mg by mouth 3 (three) times daily as needed (for pain).    [provider]  ?lisinopril-hydrochlorothiazide (ZESTORETIC) 20-12.5 MG tablet Take 1 tablet by mouth 2 (two) times daily. 08/09/19   Fayrene Helper, PA-C  ?metoprolol succinate (TOPROL-XL) 50 MG 24 hr tablet Take 1 tablet (50 mg total) by mouth daily. Take with or immediately following a meal. 08/09/19   Fayrene Helper, PA-C  ?oxyCODONE-acetaminophen (PERCOCET) 7.5-325 MG tablet Take 1 tablet by mouth every 4 (four) hours as needed for severe pain. 01/02/15   Janne Napoleon, NP  ?   ? ?Allergies    ?Patient has no known allergies.   ? ?Review of Systems   ?Review of Systems  ?Constitutional:  Positive for chills. Negative for fever.  ?HENT:  Negative for rhinorrhea and sore throat.   ?Respiratory:  Positive for cough, sputum production and shortness of breath. Negative for hemoptysis.   ?Cardiovascular:  Positive for chest pain, leg swelling and PND. Negative for syncope.  ?Gastrointestinal:  Negative for abdominal pain, blood  in stool, constipation, diarrhea, nausea and vomiting.  ?Genitourinary:  Negative for dysuria and hematuria.  ?Musculoskeletal: Negative.   ?Skin:  Negative for rash.  ?Neurological:  Positive for headaches. Negative for dizziness, tremors, syncope, facial asymmetry, speech difficulty and weakness.  ? ?Physical Exam ?Updated Vital Signs ?BP (!) 188/113   Pulse 73   Temp 97.6 ?F (36.4 ?C) (Oral)   Resp (!) 27   Ht 6\' 3"  (1.905 m)   Wt 108.9 kg   SpO2 96%   BMI 30.00 kg/m?  ?Physical Exam ?Constitutional:   ?    Appearance: He is well-developed. He is ill-appearing.  ?HENT:  ?   Head: Normocephalic and atraumatic.  ?Eyes:  ?   Extraocular Movements: Extraocular movements intact.  ?   Pupils: Pupils are equal, round, and reactive to light.  ?Cardiovascular:  ?   Rate and Rhythm: Normal rate and regular rhythm.  ?   Pulses: Normal pulses.  ?   Heart sounds: No murmur heard. ?Pulmonary:  ?   Effort: Tachypnea present.  ?   Breath sounds: Stridor present. Examination of the right-lower field reveals rales. Rales present.  ?Abdominal:  ?   General: Bowel sounds are normal.  ?   Palpations: Abdomen is soft.  ?   Tenderness: There is no abdominal tenderness.  ?Musculoskeletal:     ?   General: Normal range of motion.  ?   Right lower leg: Edema present.  ?   Left lower leg: Edema present.  ?   Comments: 1+ bilateral LE pitting edema  ?Skin: ?   General: Skin is warm and dry.  ?Neurological:  ?   General: No focal deficit present.  ?   Mental Status: He is alert and oriented to person, place, and time.  ? ? ?ED Results / Procedures / Treatments   ?Labs ?(all labs ordered are listed, but only abnormal results are displayed) ?Labs Reviewed  ?BASIC METABOLIC PANEL - Abnormal; Notable for the following components:  ?    Result Value  ? Glucose, Bld 122 (*)   ? Calcium 8.6 (*)   ? All other components within normal limits  ?CBC WITH DIFFERENTIAL/PLATELET - Abnormal; Notable for the following components:  ? Hemoglobin 12.7 (*)   ? All other components within normal limits  ?BRAIN NATRIURETIC PEPTIDE - Abnormal; Notable for the following components:  ? B Natriuretic Peptide 550.5 (*)   ? All other components within normal limits  ?RESP PANEL BY RT-PCR (FLU A&B, COVID) [id number]  ?TROPONIN I (HIGH SENSITIVITY)  ? ? ?EKG ?None ? ?Radiology ?DG Chest 2 View ? ?Result Date: 08/04/2021 ?CLINICAL DATA:  Shortness of breath. EXAM: CHEST - 2 VIEW COMPARISON:  Chest x-ray January 11, 2021. FINDINGS: Small right pleural effusion. No visible  pneumothorax. Mild diffuse interstitial opacities. Mild enlargement the cardiac silhouette. No acute fracture IMPRESSION: Small right pleural effusion, mild cardiomegaly and mild diffuse central prominence, suggestive of CHF with mild interstitial edema. Electronically Signed   By: Feliberto HartsFrederick S Jones M.D.   On: 08/04/2021 13:20   ? ?Procedures ?Procedures  ? ? ?Medications Ordered in ED ?Medications  ?cefTRIAXone (ROCEPHIN) 1 g in sodium chloride 0.9 % 100 mL IVPB (1 g Intravenous New Bag/Given 08/04/21 1530)  ?azithromycin (ZITHROMAX) 500 mg in sodium chloride 0.9 % 250 mL IVPB (has no administration in time range)  ?ipratropium-albuterol (DUONEB) 0.5-2.5 (3) MG/3ML nebulizer solution 3 mL (3 mLs Nebulization Given 08/04/21 1146)  ?furosemide (LASIX) injection 40 mg (40 mg Intravenous Given  08/04/21 1527)  ?ipratropium-albuterol (DUONEB) 0.5-2.5 (3) MG/3ML nebulizer solution 3 mL (3 mLs Nebulization Given 08/04/21 1532)  ?methylPREDNISolone sodium succinate (SOLU-MEDROL) 125 mg/2 mL injection 125 mg (125 mg Intravenous Given 08/04/21 1527)  ? ? ?ED Course/ Medical Decision Making/ A&P ?Clinical Course as of 08/04/21 1551  ?Wed Aug 04, 2021  ?1547 Troponin I (High Sensitivity) [DR]  ?1548 Troponin I (High Sensitivity) [DR]  ?  ?Clinical Course User Index ?[DR] Margarita Grizzle, MD  ? ?                        ?Medical Decision Making ?Amount and/or Complexity of Data Reviewed ?External Data Reviewed: notes. ?Labs: ordered. Decision-making details documented in ED Course. ?Radiology: ordered. Decision-making details documented in ED Course. ? ?Risk ?Prescription drug management. ? ? ?SHAYAAN PARKE is a 61 yo male with uncontrolled hypertension and tobacco use disorder who presents to the ED with progressive shortness of breath, orthopnea, leg swelling, intermittent chest pain, productive cough, and subjective fevers. Patient has not seen a PCP in >1 year. He states that he smokes about 2 packs of cigarettes per day.  Additionally, he states that over the last few months since he went through a divorce he has been drinking heavily - up to 20 beers per day with some shots of liquor as well. Last drink was ~3 days ago and the patient has never experienc

## 2021-08-04 NOTE — Consult Note (Signed)
CARDIOLOGY CONSULT NOTE  ?Patient ID: ?[name] ?MRN: [id number] ?DOB/AGE: 09/04/1960 61 y.o. ? ?Admit date: 08/04/2021 ?Attending physician: No att. providers found ?Primary Physician:  Patient, No Pcp Per (Inactive) ?Outpatient Cardiologist: NA ?Inpatient Cardiologist: Yaslyn Cumby, DO, FACC ? ?Reason of consultation: Chest Pain  ?Referring physician: No att. providers found ? ?Chief complaint: shortness of breath ? ?HPI:  ?Michael Bell is a 61 y.o. Caucasian male who presents with a chief complaint of " chest pain and shortness of breath." His past medical history and cardiovascular risk factors include: Uncontrolled HTN, smoking (2-3ppd), excessive alcohol consumption (20 beers / day along w/ liquor),  marijuana use, medication noncompliance (atleast one year), coronary artery and aortic calcifications (non-gated CT 12/2020), Emphysema. ? ?He comes to the hospital for evaluation of chest pain and shortness of breath.  Shortness of breath has been going on for the last several weeks, progressive, throughout the day but worse with effort related activities, resolves with rest.  Patient states that he is unable to lay flat at night.  Initially experiencing 3 pillow orthopnea and now has progressed to sleeping in a recliner.  Complains of bilateral lower extremity swelling. ? ?Chest pain has been present for the last 1 month, over the anterior precordium, progressive, worse with effort related activities, better with resting, has not tried sublingual nitroglycerin tablets.  Associated symptoms at times include nausea, vomiting, diaphoresis.  No cardiac work-up performed thus far.  He is not on erectile dysfunction medications. ? ?Patient states that his symptoms are progressive enough to the point that his boss recommended he come to the ED for further evaluation. ? ?On present to the emergency room department his systolic blood pressures are approximately 198 mmHg with symptoms of chest pain, tachypnea, chest x-ray  consistent with interstitial edema and bilateral pleural effusion.  BNP elevated and high sensitive troponins essentially flat. ? ?Patient states that SBP of 198 is actually considered to be low for him.  He is noted blood pressures as high as 240 mmHg at home.  Patient has stopped all his medications including blood pressure pills for at least 1 year or more.  He smokes 2 to 3 packs of cigarettes daily, excessive consumption of alcohol/liquor, and marijuana use. ? ?ALLERGIES: ?No Known Allergies ? ?PAST MEDICAL HISTORY: ?Past Medical History:  ?Diagnosis Date  ? Arthritis   ? Headache   ? Hypertension   ? ? ?PAST SURGICAL HISTORY: ?Past Surgical History:  ?Procedure Laterality Date  ? LUMBAR LAMINECTOMY N/A 01/03/2014  ? 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 NOSE SURGERY    ? 40 yrs ago  ? ? ?FAMILY HISTORY: ?The patient's family history includes COPD in his maternal grandmother; Cancer in his brother, mother, and paternal uncle; Colon cancer (age of onset: 47) in his sister; Colon polyps in his father; Diabetes in his mother; Down syndrome in his paternal aunt; Heart disease in his brother, brother, brother, father, and mother; Hyperlipidemia in his brother, father, and mother; Hypertension in his brother, brother, brother, father, and mother; Mental illness in his brother and mother; Ovarian cancer (age of onset: 69) in his mother; Stomach cancer in his paternal grandmother; Stroke in his father and mother. ?  ?SOCIAL HISTORY:  ?The patient  reports that he has been smoking cigarettes. He has a 76.00 pack-year smoking history. He has never used smokeless   tobacco. He reports current alcohol use. He reports that he does not use drugs. ? ?MEDICATIONS: ?Current Outpatient Medications  ?Medication Instructions  ? amLODipine (NORVASC) 10 mg, Oral, Daily  ?  Aspirin-Acetaminophen-Caffeine (GOODY HEADACHE PO) 1 packet, Every 6 hours PRN  ? diclofenac (VOLTAREN) 50 mg, Oral, 2 times daily  ? gabapentin (NEURONTIN) 100 mg, 3 times daily PRN  ? lisinopril-hydrochlorothiazide (ZESTORETIC) 20-12.5 MG tablet 1 tablet, Oral, 2 times daily  ? metoprolol succinate (TOPROL-XL) 50 mg, Oral, Daily, Take with or immediately following a meal.   ? oxyCODONE-acetaminophen (PERCOCET) 7.5-325 MG tablet 1 tablet, Oral, Every 4 hours PRN  ? ? ?REVIEW OF SYSTEMS: ?Review of Systems  ?Cardiovascular:  Positive for chest pain, dyspnea on exertion, leg swelling, orthopnea and paroxysmal nocturnal dyspnea. Negative for palpitations and syncope.  ?Respiratory:  Positive for cough, shortness of breath, snoring, sputum production (white,yellow, tan) and wheezing.   ?Gastrointestinal:  Positive for nausea. Negative for vomiting.  ?All other systems reviewed and are negative. ? ?PHYSICAL EXAM: ?Temp:  [97.6 ?F (36.4 ?C)-98.1 ?F (36.7 ?C)] 98.1 ?F (36.7 ?C) (05/10 2016) ?Pulse Rate:  [70-92] 80 (05/10 2016) ?Cardiac Rhythm: Normal sinus rhythm (05/10 1948) ?Resp:  [14-27] 24 (05/10 2016) ?BP: (162-198)/(102-131) 162/102 (05/10 2016) ?SpO2:  [92 %-97 %] 95 % (05/10 2016) ?Weight:  [108.9 kg] 108.9 kg (05/10 1534)  ? ? ?  08/04/2021  ?  8:16 PM 08/04/2021  ?  5:00 PM 08/04/2021  ?  4:00 PM  ?Vitals with BMI  ?Systolic 162 173 167  ?Diastolic 102 131 121  ?Pulse 80 89 70  ? ? ? ?Intake/Output Summary (Last 24 hours) at 08/04/2021 2256 ?Last data filed at 08/04/2021 2000 ?Gross per 24 hour  ?Intake 33.08 ml  ?Output 1350 ml  ?Net -1316.92 ml  ?  ?Net IO Since Admission: -1,316.92 mL [08/04/21 2256] ? ?CONSTITUTIONAL: Appears older than stated age, hemodynamically stable, mild respiratory distress.   ?SKIN: Skin is warm and dry. No rash noted. No cyanosis. No pallor. No jaundice.  Tattoos present ?HEAD: Normocephalic and atraumatic.  ?EYES: No scleral icterus ?MOUTH/THROAT: Moist oral membranes.  Pursed  breathing. ?NECK: JVD present. No thyromegaly noted. No carotid bruits  ?CHEST Normal respiratory effort. No intercostal retractions  ?LUNGS: Decreased breath sounds bilaterally with expiratory wheezing/Rales over the right posterior lungs compared to the left.  No obvious rhonchi's. ?CARDIOVASCULAR: Regular rate and rhythm, positive S1-S2, soft holosystolic murmur, no rubs or gallops appreciated ?ABDOMINAL: Obese, soft, nontender, nondistended, positive bowel sounds in all 4 quadrants, no apparent ascites.  ?EXTREMITIES: 2+ bilateral pitting edema, warm to touch.  ?HEMATOLOGIC: No significant bruising ?NEUROLOGIC: Oriented to person, place, and time. Nonfocal. Normal muscle tone.  ?PSYCHIATRIC: Normal mood and affect. Normal behavior. Cooperative ? ?RADIOLOGY: ?DG Chest 2 View ? ?Result Date: 08/04/2021 ?CLINICAL DATA:  Shortness of breath. EXAM: CHEST - 2 VIEW COMPARISON:  Chest x-ray January 11, 2021. FINDINGS: Small right pleural effusion. No visible pneumothorax. Mild diffuse interstitial opacities. Mild enlargement the cardiac silhouette. No acute fracture IMPRESSION: Small right pleural effusion, mild cardiomegaly and mild diffuse central prominence, suggestive of CHF with mild interstitial edema. Electronically Signed   By: Frederick S Jones M.D.   On: 08/04/2021 13:20   ? ?LABORATORY DATA: ?Lab Results  ?Component Value Date  ? WBC 8.2 08/04/2021  ? HGB 12.7 (L) 08/04/2021  ? HCT 40.1 08/04/2021  ? MCV 83.7 08/04/2021  ? PLT 226 08/04/2021  ?  ?Recent Labs  ?Lab 08/04/21 ?1135  ?NA   139  ?K 3.9  ?CL 111  ?CO2 23  ?BUN 15  ?CREATININE 1.03  ?CALCIUM 8.6*  ?GLUCOSE 122*  ? ? ?Lipid Panel  ?Lab Results  ?Component Value Date  ? LDLDIRECT 93.6 08/04/2021  ? ? ?BNP (last 3 results) ?Recent Labs  ?  01/11/21 ?1629 08/04/21 ?1135  ?BNP 21.4 550.5*  ? ? ?HEMOGLOBIN A1C ?Lab Results  ?Component Value Date  ? HGBA1C 5.6 08/04/2021  ? MPG 114.02 08/04/2021  ? ? ?Cardiac Panel (last 3 results) ?Recent Labs  ?   08/04/21 ?1502 08/04/21 ?1856  ?TROPONINIHS 22* 22*  ? ?TSH ?Recent Labs  ?  08/04/21 ?1856  ?TSH 0.644  ?  ? ?Radiology:  ?CTA PE Protocol 12/2020: ?Cardiovascular: No filling defects in the pulmonary arteries to suggest pulmonary em

## 2021-08-04 NOTE — H&P (View-Only) (Signed)
CARDIOLOGY CONSULT NOTE  ?Patient ID: ?Michael Bell ?MRN: [id number] ?DOB/AGE: [birth year] 61 y.o. ? ?Admit date: 08/04/2021 ?Attending physician: No att. providers found ?Primary Physician:  Patient, No Pcp Per (Inactive) ?Outpatient Cardiologist: NA ?Inpatient Cardiologist: Rex Kras, DO, [facility] ? ?Reason of consultation: Chest Pain  ?Referring physician: No att. providers found ? ?Chief complaint: shortness of breath ? ?HPI:  ?Michael Bell is a 61 y.o. Caucasian male who presents with a chief complaint of " chest pain and shortness of breath." His past medical history and cardiovascular risk factors include: Uncontrolled HTN, smoking (2-3ppd), excessive alcohol consumption (20 beers / day along w/ liquor),  marijuana use, medication noncompliance (atleast one year), coronary artery and aortic calcifications (non-gated CT 12/2020), Emphysema. ? ?He comes to the hospital for evaluation of chest pain and shortness of breath.  Shortness of breath has been going on for the last several weeks, progressive, throughout the day but worse with effort related activities, resolves with rest.  Patient states that he is unable to lay flat at night.  Initially experiencing 3 pillow orthopnea and now has progressed to sleeping in a recliner.  Complains of bilateral lower extremity swelling. ? ?Chest pain has been present for the last 1 month, over the anterior precordium, progressive, worse with effort related activities, better with resting, has not tried sublingual nitroglycerin tablets.  Associated symptoms at times include nausea, vomiting, diaphoresis.  No cardiac work-up performed thus far.  He is not on erectile dysfunction medications. ? ?Patient states that his symptoms are progressive enough to the point that his boss recommended he come to the ED for further evaluation. ? ?On present to the emergency room department his systolic blood pressures are approximately 198 mmHg with symptoms of chest pain, tachypnea, chest x-ray  consistent with interstitial edema and bilateral pleural effusion.  BNP elevated and high sensitive troponins essentially flat. ? ?Patient states that SBP of 198 is actually considered to be low for him.  He is noted blood pressures as high as 240 mmHg at home.  Patient has stopped all his medications including blood pressure pills for at least 1 year or more.  He smokes 2 to 3 packs of cigarettes daily, excessive consumption of alcohol/liquor, and marijuana use. ? ?ALLERGIES: ?No Known Allergies ? ?PAST MEDICAL HISTORY: ?Past Medical History:  ?Diagnosis Date  ? Arthritis   ? Headache   ? Hypertension   ? ? ?PAST SURGICAL HISTORY: ?Past Surgical History:  ?Procedure Laterality Date  ? LUMBAR LAMINECTOMY N/A 01/03/2014  ? 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 NOSE SURGERY    ? 40 yrs ago  ? ? ?FAMILY HISTORY: ?The patient's family history includes COPD in his maternal grandmother; Cancer in his brother, mother, and paternal uncle; Colon cancer (age of onset: 23) in his sister; Colon polyps in his father; Diabetes in his mother; Down syndrome in his paternal aunt; Heart disease in his brother, brother, brother, father, and mother; Hyperlipidemia in his brother, father, and mother; Hypertension in his brother, brother, brother, father, and mother; Mental illness in his brother and mother; Ovarian cancer (age of onset: 63) in his mother; Stomach cancer in his paternal grandmother; Stroke in his father and mother. ?  ?SOCIAL HISTORY:  ?The patient  reports that he has been smoking cigarettes. He has a 76.00 pack-year smoking history. He has never used smokeless  tobacco. He reports current alcohol use. He reports that he does not use drugs. ? ?MEDICATIONS: ?Current Outpatient Medications  ?Medication Instructions  ? amLODipine (NORVASC) 10 mg, Oral, Daily  ?  Aspirin-Acetaminophen-Caffeine (GOODY HEADACHE PO) 1 packet, Every 6 hours PRN  ? diclofenac (VOLTAREN) 50 mg, Oral, 2 times daily  ? gabapentin (NEURONTIN) 100 mg, 3 times daily PRN  ? lisinopril-hydrochlorothiazide (ZESTORETIC) 20-12.5 MG tablet 1 tablet, Oral, 2 times daily  ? metoprolol succinate (TOPROL-XL) 50 mg, Oral, Daily, Take with or immediately following a meal.   ? oxyCODONE-acetaminophen (PERCOCET) 7.5-325 MG tablet 1 tablet, Oral, Every 4 hours PRN  ? ? ?REVIEW OF SYSTEMS: ?Review of Systems  ?Cardiovascular:  Positive for chest pain, dyspnea on exertion, leg swelling, orthopnea and paroxysmal nocturnal dyspnea. Negative for palpitations and syncope.  ?Respiratory:  Positive for cough, shortness of breath, snoring, sputum production (white,yellow, tan) and wheezing.   ?Gastrointestinal:  Positive for nausea. Negative for vomiting.  ?All other systems reviewed and are negative. ? ?PHYSICAL EXAM: ?Temp:  [97.6 ?F (36.4 ?C)-98.1 ?F (36.7 ?C)] 98.1 ?F (36.7 ?C) (05/10 2016) ?Pulse Rate:  [70-92] 80 (05/10 2016) ?Cardiac Rhythm: Normal sinus rhythm (05/10 1948) ?Resp:  [14-27] 24 (05/10 2016) ?BP: (162-198)/(102-131) 162/102 (05/10 2016) ?SpO2:  [92 %-97 %] 95 % (05/10 2016) ?Weight:  [108.9 kg] 108.9 kg (05/10 1534)  ? ? ?  08/04/2021  ?  8:16 PM 08/04/2021  ?  5:00 PM 08/04/2021  ?  4:00 PM  ?Vitals with BMI  ?Systolic 0000000 [id number]  ?Diastolic A999333 A999333 [id number]  ?Pulse 80 89 70  ? ? ? ?Intake/Output Summary (Last 24 hours) at 08/04/2021 2256 ?Last data filed at 08/04/2021 2000 ?Gross per 24 hour  ?Intake 33.08 ml  ?Output 1350 ml  ?Net -1316.92 ml  ?  ?Net IO Since Admission: -1,316.92 mL [08/04/21 2256] ? ?CONSTITUTIONAL: Appears older than stated age, hemodynamically stable, mild respiratory distress.   ?SKIN: Skin is warm and dry. No rash noted. No cyanosis. No pallor. No jaundice.  Tattoos present ?HEAD: Normocephalic and atraumatic.  ?EYES: No scleral icterus ?MOUTH/THROAT: Moist oral membranes.  Pursed  breathing. ?NECK: JVD present. No thyromegaly noted. No carotid bruits  ?CHEST Normal respiratory effort. No intercostal retractions  ?LUNGS: Decreased breath sounds bilaterally with expiratory wheezing/Rales over the right posterior lungs compared to the left.  No obvious rhonchi's. ?CARDIOVASCULAR: Regular rate and rhythm, positive [id number], soft holosystolic murmur, no rubs or gallops appreciated ?ABDOMINAL: Obese, soft, nontender, nondistended, positive bowel sounds in all 4 quadrants, no apparent ascites.  ?EXTREMITIES: 2+ bilateral pitting edema, warm to touch.  ?HEMATOLOGIC: No significant bruising ?NEUROLOGIC: Oriented to person, place, and time. Nonfocal. Normal muscle tone.  ?PSYCHIATRIC: Normal mood and affect. Normal behavior. Cooperative ? ?RADIOLOGY: ?DG Chest 2 View ? ?Result Date: 08/04/2021 ?CLINICAL DATA:  Shortness of breath. EXAM: CHEST - 2 VIEW COMPARISON:  Chest x-ray January 11, 2021. FINDINGS: Small right pleural effusion. No visible pneumothorax. Mild diffuse interstitial opacities. Mild enlargement the cardiac silhouette. No acute fracture IMPRESSION: Small right pleural effusion, mild cardiomegaly and mild diffuse central prominence, suggestive of CHF with mild interstitial edema. Electronically Signed   By: Margaretha Sheffield M.D.   On: 08/04/2021 13:20   ? ?LABORATORY DATA: ?Lab Results  ?Component Value Date  ? WBC 8.2 08/04/2021  ? HGB 12.7 (L) 08/04/2021  ? HCT 40.1 08/04/2021  ? MCV 83.7 08/04/2021  ? PLT 226 08/04/2021  ?  ?Recent Labs  ?Lab 08/04/21 ?1135  ?NA  139  ?K 3.9  ?CL 111  ?CO2 23  ?BUN 15  ?CREATININE 1.03  ?CALCIUM 8.6*  ?GLUCOSE 122*  ? ? ?Lipid Panel  ?Lab Results  ?Component Value Date  ? LDLDIRECT 93.6 08/04/2021  ? ? ?BNP (last 3 results) ?Recent Labs  ?  01/11/21 ?1629 08/04/21 ?1135  ?BNP 21.4 550.5*  ? ? ?HEMOGLOBIN A1C ?Lab Results  ?Component Value Date  ? HGBA1C 5.6 08/04/2021  ? MPG 114.02 08/04/2021  ? ? ?Cardiac Panel (last 3 results) ?Recent Labs  ?   08/04/21 ?1502 08/04/21 ?1856  ?TROPONINIHS 22* 22*  ? ?TSH ?Recent Labs  ?  08/04/21 ?1856  ?TSH 0.644  ?  ? ?Radiology:  ?CTA PE Protocol 12/2020: ?Cardiovascular: No filling defects in the pulmonary arteries to suggest pulmonary em

## 2021-08-04 NOTE — Progress Notes (Signed)
ANTICOAGULATION CONSULT NOTE - Initial Consult ? ?Pharmacy Consult for Heparin ?Indication: chest pain/ACS ? ?No Known Allergies ? ?Patient Measurements: ?Height: 6\' 3"  (190.5 cm) ?Weight: 108.9 kg (240 lb) ?IBW/kg (Calculated) : 84.5 ?Heparin Dosing Weight: 106 kg ? ?Vital Signs: ?Temp: 98.1 ?F (36.7 ?C) (05/10 2016) ?Temp Source: Oral (05/10 2016) ?BP: 162/102 (05/10 2016) ?Pulse Rate: 80 (05/10 2016) ? ?Labs: ?Recent Labs  ?  08/04/21 ?1135 08/04/21 ?1502 08/04/21 ?1856  ?HGB 12.7*  --   --   ?HCT 40.1  --   --   ?PLT 226  --   --   ?CREATININE 1.03  --   --   ?TROPONINIHS  --  22* 22*  ? ? ?Estimated Creatinine Clearance: 101.7 mL/min (by C-G formula based on SCr of 1.03 mg/dL). ? ? ?Medical History: ?Past Medical History:  ?Diagnosis Date  ? Arthritis   ? Headache   ? Hypertension   ? ? ?Medications:  ?Awaiting med rec ? ?Assessment: ?61 y.o. M presents with CP. Trop 22. Pharmacy consulted to start heparin for NSTEMI. CBC stable. No AC PTA. Pt received Lovenox 40mg  ~1730 tonight. ? ?Goal of Therapy:  ?Heparin level 0.3-0.7 units/ml ?Monitor platelets by anticoagulation protocol: Yes ?  ?Plan:  ?D/c Lovenox ?Heparin IV bolus 4000 units ?Heparin gtt at 1450 units/hr ?Will f/u heparin level in 6 hours ?Daily heparin level and CBC ? ?67, PharmD, BCPS ?Please see amion for complete clinical pharmacist phone list ?08/04/2021,10:04 PM ? ? ?

## 2021-08-05 ENCOUNTER — Inpatient Hospital Stay (HOSPITAL_COMMUNITY): Payer: 59

## 2021-08-05 ENCOUNTER — Observation Stay (HOSPITAL_COMMUNITY): Payer: 59

## 2021-08-05 ENCOUNTER — Other Ambulatory Visit (HOSPITAL_COMMUNITY): Payer: Self-pay

## 2021-08-05 ENCOUNTER — Inpatient Hospital Stay (HOSPITAL_COMMUNITY)
Admission: EM | Disposition: A | Discharge status: Home / Self Care | Payer: Self-pay | Source: Home / Self Care | Attending: Internal Medicine

## 2021-08-05 ENCOUNTER — Encounter (HOSPITAL_COMMUNITY): Payer: Self-pay | Admitting: Cardiology

## 2021-08-05 DIAGNOSIS — I509 Heart failure, unspecified: Secondary | ICD-10-CM

## 2021-08-05 DIAGNOSIS — R0602 Shortness of breath: Principal | ICD-10-CM

## 2021-08-05 DIAGNOSIS — F17210 Nicotine dependence, cigarettes, uncomplicated: Secondary | ICD-10-CM

## 2021-08-05 DIAGNOSIS — I700 Atherosclerosis of aorta: Secondary | ICD-10-CM | POA: Diagnosis present

## 2021-08-05 DIAGNOSIS — R7989 Other specified abnormal findings of blood chemistry: Secondary | ICD-10-CM | POA: Diagnosis present

## 2021-08-05 DIAGNOSIS — R0603 Acute respiratory distress: Secondary | ICD-10-CM | POA: Diagnosis present

## 2021-08-05 DIAGNOSIS — I5043 Acute on chronic combined systolic (congestive) and diastolic (congestive) heart failure: Secondary | ICD-10-CM | POA: Diagnosis present

## 2021-08-05 DIAGNOSIS — M5126 Other intervertebral disc displacement, lumbar region: Secondary | ICD-10-CM | POA: Diagnosis present

## 2021-08-05 DIAGNOSIS — I110 Hypertensive heart disease with heart failure: Secondary | ICD-10-CM | POA: Diagnosis present

## 2021-08-05 DIAGNOSIS — R011 Cardiac murmur, unspecified: Secondary | ICD-10-CM | POA: Diagnosis present

## 2021-08-05 DIAGNOSIS — G4700 Insomnia, unspecified: Secondary | ICD-10-CM | POA: Diagnosis present

## 2021-08-05 DIAGNOSIS — I25118 Atherosclerotic heart disease of native coronary artery with other forms of angina pectoris: Secondary | ICD-10-CM | POA: Diagnosis present

## 2021-08-05 DIAGNOSIS — J810 Acute pulmonary edema: Secondary | ICD-10-CM

## 2021-08-05 DIAGNOSIS — I200 Unstable angina: Secondary | ICD-10-CM

## 2021-08-05 DIAGNOSIS — Z91141 Patient's other noncompliance with medication regimen due to financial hardship: Secondary | ICD-10-CM | POA: Diagnosis not present

## 2021-08-05 DIAGNOSIS — Z23 Encounter for immunization: Secondary | ICD-10-CM | POA: Diagnosis not present

## 2021-08-05 DIAGNOSIS — M1990 Unspecified osteoarthritis, unspecified site: Secondary | ICD-10-CM | POA: Diagnosis present

## 2021-08-05 DIAGNOSIS — Z20822 Contact with and (suspected) exposure to covid-19: Secondary | ICD-10-CM | POA: Diagnosis present

## 2021-08-05 DIAGNOSIS — I161 Hypertensive emergency: Secondary | ICD-10-CM | POA: Diagnosis present

## 2021-08-05 DIAGNOSIS — J439 Emphysema, unspecified: Secondary | ICD-10-CM | POA: Diagnosis present

## 2021-08-05 DIAGNOSIS — Z825 Family history of asthma and other chronic lower respiratory diseases: Secondary | ICD-10-CM | POA: Diagnosis not present

## 2021-08-05 DIAGNOSIS — D649 Anemia, unspecified: Secondary | ICD-10-CM | POA: Diagnosis present

## 2021-08-05 DIAGNOSIS — I160 Hypertensive urgency: Secondary | ICD-10-CM | POA: Diagnosis present

## 2021-08-05 DIAGNOSIS — E785 Hyperlipidemia, unspecified: Secondary | ICD-10-CM | POA: Diagnosis present

## 2021-08-05 DIAGNOSIS — I214 Non-ST elevation (NSTEMI) myocardial infarction: Secondary | ICD-10-CM | POA: Diagnosis present

## 2021-08-05 DIAGNOSIS — I255 Ischemic cardiomyopathy: Secondary | ICD-10-CM | POA: Diagnosis present

## 2021-08-05 DIAGNOSIS — Z8249 Family history of ischemic heart disease and other diseases of the circulatory system: Secondary | ICD-10-CM | POA: Diagnosis not present

## 2021-08-05 DIAGNOSIS — I7143 Infrarenal abdominal aortic aneurysm, without rupture: Secondary | ICD-10-CM | POA: Diagnosis present

## 2021-08-05 DIAGNOSIS — I5031 Acute diastolic (congestive) heart failure: Secondary | ICD-10-CM | POA: Diagnosis present

## 2021-08-05 DIAGNOSIS — F1010 Alcohol abuse, uncomplicated: Secondary | ICD-10-CM | POA: Diagnosis present

## 2021-08-05 HISTORY — PX: RIGHT/LEFT HEART CATH AND CORONARY ANGIOGRAPHY: CATH118266

## 2021-08-05 LAB — LIPID PANEL
Cholesterol: 133 mg/dL (ref 0–200)
HDL: 43 mg/dL (ref >40)
LDL Cholesterol: 86 mg/dL (ref 0–99)
Total CHOL/HDL Ratio: 3.1 RATIO
Triglycerides: 22 mg/dL (ref <150)
VLDL: 4 mg/dL (ref 0–40)

## 2021-08-05 LAB — POCT I-STAT EG7
Acid-Base Excess: 3 mmol/L — ABNORMAL HIGH (ref 0.0–2.0)
Acid-Base Excess: 3 mmol/L — ABNORMAL HIGH (ref 0.0–2.0)
Bicarbonate: 26.4 mmol/L (ref 20.0–28.0)
Bicarbonate: 26.8 mmol/L (ref 20.0–28.0)
Calcium, Ion: 1.15 mmol/L (ref 1.15–1.40)
Calcium, Ion: 1.17 mmol/L (ref 1.15–1.40)
HCT: 39 % (ref 39.0–52.0)
HCT: 40 % (ref 39.0–52.0)
Hemoglobin: 13.3 g/dL (ref 13.0–17.0)
Hemoglobin: 13.6 g/dL (ref 13.0–17.0)
O2 Saturation: 73 %
O2 Saturation: 75 %
Potassium: 3.2 mmol/L — ABNORMAL LOW (ref 3.5–5.1)
Potassium: 3.2 mmol/L — ABNORMAL LOW (ref 3.5–5.1)
Sodium: 140 mmol/L (ref 135–145)
Sodium: 141 mmol/L (ref 135–145)
TCO2: 28 mmol/L (ref 22–32)
TCO2: 28 mmol/L (ref 22–32)
pCO2, Ven: 37.5 mmHg — ABNORMAL LOW (ref 44–60)
pCO2, Ven: 38 mmHg — ABNORMAL LOW (ref 44–60)
pH, Ven: 7.456 — ABNORMAL HIGH (ref 7.25–7.43)
pH, Ven: 7.456 — ABNORMAL HIGH (ref 7.25–7.43)
pO2, Ven: 36 mmHg (ref 32–45)
pO2, Ven: 38 mmHg (ref 32–45)

## 2021-08-05 LAB — ECHOCARDIOGRAM COMPLETE
AR max vel: 2.43 cm2
AV Peak grad: 15.2 mmHg
Ao pk vel: 1.95 m/s
Area-P 1/2: 4.8 cm2
Calc EF: 43.6 %
Height: 75 in
S' Lateral: 5.1 cm
Single Plane A2C EF: 44.4 %
Single Plane A4C EF: 42.1 %
Weight: 3735.47 oz

## 2021-08-05 LAB — CBC
HCT: 40.6 % (ref 39.0–52.0)
Hemoglobin: 13.2 g/dL (ref 13.0–17.0)
MCH: 27 pg (ref 26.0–34.0)
MCHC: 32.5 g/dL (ref 30.0–36.0)
MCV: 83 fL (ref 80.0–100.0)
Platelets: 275 10*3/uL (ref 150–400)
RBC: 4.89 MIL/uL (ref 4.22–5.81)
RDW: 14 % (ref 11.5–15.5)
WBC: 7.8 10*3/uL (ref 4.0–10.5)
nRBC: 0 % (ref 0.0–0.2)

## 2021-08-05 LAB — POCT I-STAT 7, (LYTES, BLD GAS, ICA,H+H)
Acid-Base Excess: 2 mmol/L (ref 0.0–2.0)
Bicarbonate: 25.8 mmol/L (ref 20.0–28.0)
Calcium, Ion: 1.19 mmol/L (ref 1.15–1.40)
HCT: 40 % (ref 39.0–52.0)
Hemoglobin: 13.6 g/dL (ref 13.0–17.0)
O2 Saturation: 96 %
Potassium: 3.3 mmol/L — ABNORMAL LOW (ref 3.5–5.1)
Sodium: 140 mmol/L (ref 135–145)
TCO2: 27 mmol/L (ref 22–32)
pCO2 arterial: 35.8 mmHg (ref 32–48)
pH, Arterial: 7.466 — ABNORMAL HIGH (ref 7.35–7.45)
pO2, Arterial: 80 mmHg — ABNORMAL LOW (ref 83–108)

## 2021-08-05 LAB — BASIC METABOLIC PANEL
Anion gap: 7 (ref 5–15)
BUN: 18 mg/dL (ref 6–20)
CO2: 26 mmol/L (ref 22–32)
Calcium: 8.8 mg/dL — ABNORMAL LOW (ref 8.9–10.3)
Chloride: 105 mmol/L (ref 98–111)
Creatinine, Ser: 1.18 mg/dL (ref 0.61–1.24)
GFR, Estimated: >60 mL/min (ref >60)
Glucose, Bld: 147 mg/dL — ABNORMAL HIGH (ref 70–99)
Potassium: 3.6 mmol/L (ref 3.5–5.1)
Sodium: 138 mmol/L (ref 135–145)

## 2021-08-05 LAB — HEPARIN LEVEL (UNFRACTIONATED): Heparin Unfractionated: 0.24 IU/mL — ABNORMAL LOW (ref 0.30–0.70)

## 2021-08-05 SURGERY — RIGHT/LEFT HEART CATH AND CORONARY ANGIOGRAPHY
Anesthesia: LOCAL

## 2021-08-05 MED ORDER — CLOPIDOGREL BISULFATE 75 MG PO TABS
75.0000 mg | ORAL_TABLET | Freq: Every day | ORAL | Status: DC
Start: 2021-08-05 — End: 2021-08-05

## 2021-08-05 MED ORDER — CARVEDILOL 25 MG PO TABS
25.0000 mg | ORAL_TABLET | Freq: Two times a day (BID) | ORAL | Status: DC
  Administered 2021-08-05 – 2021-08-06 (×2): 25 mg via ORAL
  Filled 2021-08-05 (×2): qty 1

## 2021-08-05 MED ORDER — SACUBITRIL-VALSARTAN 49-51 MG PO TABS
1.0000 | ORAL_TABLET | Freq: Two times a day (BID) | ORAL | Status: DC
  Administered 2021-08-05 – 2021-08-06 (×2): 1 via ORAL
  Filled 2021-08-05 (×3): qty 1

## 2021-08-05 MED ORDER — MIDAZOLAM HCL 2 MG/2ML IJ SOLN
INTRAMUSCULAR | Status: DC | PRN
  Administered 2021-08-05: 1 mg via INTRAVENOUS
  Administered 2021-08-05: 2 mg via INTRAVENOUS

## 2021-08-05 MED ORDER — MAGNESIUM SULFATE 2 GM/50ML IV SOLN
2.0000 g | Freq: Once | INTRAVENOUS | Status: AC
  Administered 2021-08-05: 2 g via INTRAVENOUS
  Filled 2021-08-05: qty 50

## 2021-08-05 MED ORDER — HEPARIN (PORCINE) IN NACL 1000-0.9 UT/500ML-% IV SOLN
INTRAVENOUS | Status: DC | PRN
  Administered 2021-08-05 (×2): 500 mL

## 2021-08-05 MED ORDER — HEPARIN SODIUM (PORCINE) 5000 UNIT/ML IJ SOLN: 5000.0000 [IU] | Freq: Three times a day (TID) | INTRAMUSCULAR | Status: DC

## 2021-08-05 MED ORDER — SODIUM CHLORIDE 0.9% FLUSH: 3.0000 mL | INTRAVENOUS | Status: DC | PRN

## 2021-08-05 MED ORDER — IOHEXOL 350 MG/ML SOLN
INTRAVENOUS | Status: DC | PRN
  Administered 2021-08-05: 110 mL

## 2021-08-05 MED ORDER — SPIRONOLACTONE 12.5 MG HALF TABLET
12.5000 mg | ORAL_TABLET | Freq: Every day | ORAL | Status: DC
  Administered 2021-08-05 – 2021-08-06 (×2): 12.5 mg via ORAL
  Filled 2021-08-05 (×2): qty 1

## 2021-08-05 MED ORDER — FENTANYL CITRATE (PF) 100 MCG/2ML IJ SOLN
INTRAMUSCULAR | Status: DC | PRN
  Administered 2021-08-05: 25 ug via INTRAVENOUS
  Administered 2021-08-05: 50 ug via INTRAVENOUS

## 2021-08-05 MED ORDER — NITROGLYCERIN 1 MG/10 ML FOR IR/CATH LAB
INTRA_ARTERIAL | Status: DC | PRN
  Administered 2021-08-05: 200 ug via INTRACORONARY

## 2021-08-05 MED ORDER — SODIUM CHLORIDE 0.9 % IV SOLN: INTRAVENOUS | Status: DC

## 2021-08-05 MED ORDER — CLOPIDOGREL BISULFATE 75 MG PO TABS
75.0000 mg | ORAL_TABLET | Freq: Every day | ORAL | Status: DC
  Administered 2021-08-05 – 2021-08-06 (×2): 75 mg via ORAL
  Filled 2021-08-05 (×2): qty 1

## 2021-08-05 MED ORDER — AMLODIPINE BESYLATE 5 MG PO TABS
5.0000 mg | ORAL_TABLET | Freq: Every day | ORAL | Status: DC
  Administered 2021-08-05: 5 mg via ORAL
  Filled 2021-08-05: qty 1

## 2021-08-05 MED ORDER — ENOXAPARIN SODIUM 40 MG/0.4ML IJ SOSY
40.0000 mg | PREFILLED_SYRINGE | INTRAMUSCULAR | Status: DC
  Administered 2021-08-05: 40 mg via SUBCUTANEOUS
  Filled 2021-08-05: qty 0.4

## 2021-08-05 MED ORDER — POTASSIUM CHLORIDE 20 MEQ PO PACK
40.0000 meq | PACK | ORAL | Status: AC
  Administered 2021-08-05 (×2): 40 meq via ORAL
  Filled 2021-08-05 (×2): qty 2

## 2021-08-05 MED ORDER — LOSARTAN POTASSIUM 50 MG PO TABS
50.0000 mg | ORAL_TABLET | Freq: Every day | ORAL | Status: DC
Start: 2021-08-06 — End: 2021-08-05

## 2021-08-05 MED ORDER — SODIUM CHLORIDE 0.9 % IV SOLN: 250.0000 mL | INTRAVENOUS | Status: DC | PRN

## 2021-08-05 MED ORDER — LIDOCAINE HCL (PF) 1 % IJ SOLN
INTRAMUSCULAR | Status: DC | PRN
  Administered 2021-08-05 (×2): 2 mL

## 2021-08-05 MED ORDER — UMECLIDINIUM BROMIDE 62.5 MCG/ACT IN AEPB
1.0000 | INHALATION_SPRAY | Freq: Every day | RESPIRATORY_TRACT | Status: DC
  Administered 2021-08-05 – 2021-08-06 (×2): 1 via RESPIRATORY_TRACT
  Filled 2021-08-05: qty 7

## 2021-08-05 MED ORDER — LABETALOL HCL 5 MG/ML IV SOLN: 10.0000 mg | INTRAVENOUS | Status: AC | PRN

## 2021-08-05 MED ORDER — NITROGLYCERIN 1 MG/10 ML FOR IR/CATH LAB
INTRA_ARTERIAL | Status: AC
  Filled 2021-08-05: qty 10

## 2021-08-05 MED ORDER — SODIUM CHLORIDE 0.9% FLUSH
3.0000 mL | Freq: Two times a day (BID) | INTRAVENOUS | Status: DC
  Administered 2021-08-05 – 2021-08-06 (×2): 3 mL via INTRAVENOUS

## 2021-08-05 MED ORDER — ALBUTEROL SULFATE (2.5 MG/3ML) 0.083% IN NEBU
3.0000 mL | INHALATION_SOLUTION | RESPIRATORY_TRACT | Status: DC | PRN
  Administered 2021-08-06: 3 mL via RESPIRATORY_TRACT
  Filled 2021-08-05: qty 3

## 2021-08-05 MED ORDER — ALBUTEROL SULFATE HFA 108 (90 BASE) MCG/ACT IN AERS
1.0000 | INHALATION_SPRAY | RESPIRATORY_TRACT | Status: DC | PRN
  Administered 2021-08-05 – 2021-08-06 (×2): 2 via RESPIRATORY_TRACT
  Filled 2021-08-05: qty 6.7

## 2021-08-05 MED ORDER — FLUTICASONE PROPIONATE 50 MCG/ACT NA SUSP
2.0000 | Freq: Every day | NASAL | Status: DC
  Administered 2021-08-05 – 2021-08-06 (×2): 2 via NASAL
  Filled 2021-08-05: qty 16

## 2021-08-05 MED ORDER — VERAPAMIL HCL 2.5 MG/ML IV SOLN
INTRAVENOUS | Status: DC | PRN
  Administered 2021-08-05: 10 mL via INTRA_ARTERIAL

## 2021-08-05 MED ORDER — SODIUM CHLORIDE 0.9% FLUSH
3.0000 mL | Freq: Two times a day (BID) | INTRAVENOUS | Status: DC
  Administered 2021-08-05: 3 mL via INTRAVENOUS

## 2021-08-05 MED ORDER — IBUPROFEN 600 MG PO TABS
600.0000 mg | ORAL_TABLET | Freq: Once | ORAL | Status: AC
Start: 2021-08-05 — End: 2021-08-05
  Administered 2021-08-05: 600 mg via ORAL
  Filled 2021-08-05: qty 1

## 2021-08-05 SURGICAL SUPPLY — 13 items
CATH BALLN WEDGE 5F 110CM (CATHETERS) ×1 IMPLANT
CATH INFINITI 5 FR JL3.5 (CATHETERS) ×1 IMPLANT
CATH INFINITI 5F PIG 125CM (CATHETERS) ×1 IMPLANT
CATH OPTITORQUE TIG 4.0 5F (CATHETERS) ×1 IMPLANT
DEVICE RAD COMP TR BAND LRG (VASCULAR PRODUCTS) ×1 IMPLANT
GLIDESHEATH SLEND A-KIT 6F 22G (SHEATH) ×1 IMPLANT
GUIDEWIRE INQWIRE 1.5J.035X260 (WIRE) IMPLANT
INQWIRE 1.5J .035X260CM (WIRE) ×2
KIT HEART LEFT (KITS) ×2 IMPLANT
PACK CARDIAC CATHETERIZATION (CUSTOM PROCEDURE TRAY) ×2 IMPLANT
SHEATH GLIDE SLENDER 4/5FR (SHEATH) ×1 IMPLANT
TRANSDUCER W/STOPCOCK (MISCELLANEOUS) ×2 IMPLANT
TUBING CIL FLEX 10 FLL-RA (TUBING) ×2 IMPLANT

## 2021-08-05 NOTE — Progress Notes (Signed)
Internal Medicine Attending Note: ? ?Patient is a very pleasant 61 year old gentleman with past medical history of uncontrolled hypertension and heavy alcohol and tobacco use who does not receive regular medical care.  He presented with 2 weeks of flulike symptoms associated with dyspnea, PND, and lower extremity edema.  At baseline he reported a 2-3 pillow orthopnea but recently has been sleeping in his recliner due to inability to lay flat.  On admission he was found to have an elevated BNP of 550 and chest x-ray with evidence of pulmonary edema.  EKG showed evidence of LVH as well as T wave inversions in the lateral leads however high-sensitivity troponins were flat at 22 x 2.  He was given IV Lasix with good response, total of 3.0 L urine output yesterday.  Due to his extensive smoking history (currently 2-3 packs/day) he was also started on treatment for possible COPD exacerbation. ? ?This morning, echocardiogram showed EF of 40-45% with LVH, regional wall motion abnormalities, and grade II diastolic dysfunction.  Right ventricular systolic function was normal.  Cardiology was consulted and he is now status post right and left heart catheterization.  Found to have a distal left PDA occlusion, felt to be the etiology for his EKG abnormalities. EDP was mildly elevated. Cardiology has recommended medical management for NSTEMI. ? ?Physical Exam ?Blood pressure (!) 148/105, pulse 83, temperature 98 ?F (36.7 ?C), temperature source Oral, resp. rate 18, height 6\' 3"  (1.905 m), weight 105.9 kg, SpO2 91 %.  ?Constitutional: NAD, appears comfortable ?Cardiovascular: RRR, no murmurs, rubs, or gallops.  ?Pulmonary/Chest: Poor air movement throughout, no crackles.  Normal effort. ?Abdominal: Soft, non tender, non distended. +BS.  ?Extremities: Warm and well perfused. Distal pulses intact. No edema. Right wrist catheterization site C/D/I.  ?Psychiatric: Normal mood and affect ? 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 ?Tobacco Abuse ?Concern for COPD ?Continues to have dyspnea despite adequate diuresis, appears near euvolemic today. Has poor air movement throughout but no wheezing.  ?-- Stop steroids ?-- Start scheduled incruse daily and albuterol MDI q4h PRN ?-- Encouraged smoking cessation ?-- Outpatient PFTs ? ?Alcohol Abuse ?-- CIWA without ativan  ? ?FEN: no fluids, replete lytes prn, heart healthy diet ?VTE ppx: Lovenox  ?Code Status: FULL  ? ? ?[facility], MD ?08/05/2021, 1:37 PM  ?

## 2021-08-05 NOTE — Progress Notes (Signed)
IV heparin and IV nitro drips started per order. Titrating nitro drip with goal SBP 100-110. Diastolic BP decreased somewhat but systolic remains elevated and even increasing. Patient now complaining of headache; administering PRN tylenol.  ?

## 2021-08-05 NOTE — Progress Notes (Signed)
ANTICOAGULATION CONSULT NOTE ? ?Pharmacy Consult for Heparin ?Indication: chest pain/ACS ? ?No Known Allergies ? ?Patient Measurements: ?Height: 6\' 3"  (190.5 cm) ?Weight: 105.9 kg (233 lb 7.5 oz) ?IBW/kg (Calculated) : 84.5 ?Heparin Dosing Weight: 106 kg ? ?Vital Signs: ?Temp: 98 ?F (36.7 ?C) (05/11 0500) ?Temp Source: Oral (05/11 0500) ?BP: 168/108 (05/11 0500) ?Pulse Rate: 76 (05/11 0500) ? ?Labs: ?Recent Labs  ?  08/04/21 ?1135 08/04/21 ?1502 08/04/21 ?1856 08/05/21 ?10/05/21  ?HGB 12.7*  --   --  13.2  ?HCT 40.1  --   --  40.6  ?PLT 226  --   --  275  ?HEPARINUNFRC  --   --   --  0.24*  ?CREATININE 1.03  --   --   --   ?TROPONINIHS  --  22* 22*  --   ? ? ? ?Estimated Creatinine Clearance: 100.4 mL/min (by C-G formula based on SCr of 1.03 mg/dL). ? ? ?Medical History: ?Past Medical History:  ?Diagnosis Date  ? Arthritis   ? Headache   ? Hypertension   ? ? ?Medications:  ?Awaiting med rec ? ?Assessment: ?61 y.o. M presents with CP. Trop 22. Pharmacy consulted to start heparin for NSTEMI. CBC stable. No AC PTA. Pt received Lovenox 40mg  ~1730 08/04/21 ? ?Initial heparin level subtherapeutic:0.24, no issues with infusion or s/sx of bleeding,  ? ?Goal of Therapy:  ?Heparin level 0.3-0.7 units/ml ?Monitor platelets by anticoagulation protocol: Yes ?  ?Plan:  ?Increase heparin gtt to  1700 units/hr ?Will f/u heparin level in 6 hours ?Daily heparin level and CBC ? ? , PharmD ?Clinical Pharmacist ?08/05/2021 5:47 AM ?Please check AMION for all [facility] numbers ? ? ? ?

## 2021-08-05 NOTE — Interval H&P Note (Signed)
History and Physical Interval Note: ? ?08/05/2021 ?7:36 AM ? ?Michael Bell  has presented today for surgery, with the diagnosis of heart failure.  The various methods of treatment have been discussed with the patient and family. After consideration of risks, benefits and other options for treatment, the patient has consented to  Procedure(s): ?RIGHT/LEFT HEART CATH AND CORONARY ANGIOGRAPHY (N/A) and possible angioplasty as a surgical intervention.  The patient's history has been reviewed, patient examined, no change in status, stable for surgery.  I have reviewed the patient's chart and labs.  Questions were answered to the patient's satisfaction.   ?I have reviewed his admission history and physical, reviewed his labs, personally examined the patient and discussed with the patient regarding the procedure and complications.  Patient has new EKG abnormalities in the lateral leads with profound ST depression, they may indicate hypertensive heart disease however ischemia cannot be excluded.  In view of his multiple cardiac risk factors, we will proceed with cardiac catheterization, we will also do right heart catheterization to evaluate for pulm hypertension.  I may consider doing abdominal angiogram to evaluate for renal artery stenosis as he presented with pulm edema and hypertensive urgency.  However his manifestation may be related to poor diet and noncompliance with blood pressure medications. ? ?Although has history of alcohol use, alcohol levels was below normal. ? ?Michael Bell ? ? ?

## 2021-08-05 NOTE — Progress Notes (Addendum)
Heart Failure Stewardship Pharmacist Progress Note ? ? ?PCP: Patient, No Pcp Per (Inactive) ?PCP-Cardiologist: None  ? ?HPI:  ?61 yo male with PMH of uncontrolled HTN, CAD with aortic calcifications, emphysema, active tobacco use (smokes 2-3 PPD), excessive alcohol consumption (20 beers/day + liquor), marijuana use, and medication noncompliance for at leat one year. Presented to ED with persistent chest pain for 1 month, bilateral LEE and progressive SOB requiring 3 pillow orthopnea and eventually sleeping in a recliner. CXR pleural effusion interstitial edema with mild cardiomegaly. Echo with new mildly reduced EF 40-45%, dilated LV, normal RV with mildly enlarged RV wall thickness and moderate biatrial dilation. Underwent [facility] 08/05/21. Significant RCA disease of proximal RCA 80%, distal RCA 90%. RHC revealed elevated pulmonary pressures (32/19, mean 23), elevated wedge, adequate CO/CI (7.8/3.3) ? ?Current HF Medications: ?Diuretic: furosemide 40 mg IV twice daily ?Beta Blocker: carvedilol 25 mg twice daily ?ACE/ARB/ARNI: losartan 50 mg daily ? ?Prior to admission HF Medications: ?Beta blocker: metoprolol succinate 50 mg BID (not taking) ? ?Pertinent Lab Values: ?Serum creatinine 1.18, BUN 18, Potassium 3.6, Sodium 138, BNP 550, Magnesium 1.8, A1c 5.6% ? ?Vital Signs: ?Weight: 233 lbs (admission weight: 240 lbs) ?Blood pressure: 150s/100s  ?Heart rate: 70-80s  ?I/O: -1.7L yesterday; net -4.1L ? ?Medication Assistance / Insurance Benefits Check: ?Does the patient have prescription insurance?  Pending ?Type of insurance plan: [employer]  ? ?Outpatient Pharmacy:  ?Prior to admission outpatient pharmacy: nonadherent x1 year ?Is the patient willing to use [facility] TOC pharmacy at discharge? Pending ?Is the patient willing to transition their outpatient pharmacy to utilize a [facility] outpatient pharmacy?   Pending ? ?Assessment: ?1. 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 ?- Continue furosemide IV  40 mg twice daily ?- Continue carvedilol 25 mg twice daily ?  ?Plan: ?1) Medication changes recommended at this time: ?- Switch losartan to Entresto 49/51 mg twice daily  ?- Stop amlodipine 5 mg daily ?- Start spironolactone 12.5 mg daily ?- Replete potassium 40 mEq x2 and IV Mg 2g x1 ? ?2) Patient assistance: ?- pending patient discussion ?- will inquire about insurance - likely apply for manufacturer's assistance  ? ?3)  Education  ?- To be completed prior to discharge ? ?Filbert Schilder, PharmD ?PGY1 Pharmacy Resident ?08/05/2021  4:40 PM ?

## 2021-08-05 NOTE — TOC Initial Note (Signed)
Transition of Care (TOC) - Initial/Assessment Note  ? ? ?Patient Details  ?Name: Michael Bell ?MRN: [id number] ?Date of Birth: 22-May-1960 ? ?Transition of Care (TOC) CM/SW Contact:    ?Graves-Bigelow, Ocie Cornfield, RN ?Phone Number: ?08/05/2021, 12:23 PM ? ?Clinical Narrative:  Case Manager spoke with patient regarding insurance. Patient is without insurance at this time and is not working. Patient lives with spouse in an apartment. Patient in need of PCP- Case Manager called [facility] and scheduled an appointment. Information placed on the AVS. Case Manager will assist the patient with MATCH for medication assistance. Outpatient the patient will utilize the [facility] on the 1st floor of the clinic- medications range from $4.00-$10.00. Case Manager discussed patient calling DSS for assistance with Medicaid post hospitalization. No further needs identified at this time.     ? ? ?Expected Discharge Plan: Home/Self Care ?Barriers to Discharge: No Barriers Identified ? ? ?Patient Goals and CMS Choice ?Patient states their goals for this hospitalization and ongoing recovery are:: plan to return home. ?  ?Choice offered to / list presented to : NA ? ?Expected Discharge Plan and Services ?Expected Discharge Plan: Home/Self Care ?In-house Referral: NA ?Discharge Planning Services: CM Consult ?Post Acute Care Choice: NA ?Living arrangements for the past 2 months: Apartment ?                ?  ?DME Agency: NA ?  ?  ?  ?HH Arranged: NA ?  ?  ?  ?  ? ?Prior Living Arrangements/Services ?Living arrangements for the past 2 months: Apartment ?Lives with:: Spouse, Self ?Patient language and need for interpreter reviewed:: Yes ?       ?Need for Family Participation in Patient Care: Yes (Comment) ?Care giver support system in place?: Yes (comment) ?  ?Criminal Activity/Legal Involvement Pertinent to Current Situation/Hospitalization: No - Comment as needed ? ?Activities of Daily Living ?  ?  ? ?Permission  Sought/Granted ?Permission sought to share information with : Family Supports, Case Manager ?  ?   ?   ?   ?   ? ?Emotional Assessment ?Appearance:: Appears stated age ?Attitude/Demeanor/Rapport: Engaged ?Affect (typically observed): Appropriate ?Orientation: : Oriented to Situation, Oriented to Place, Oriented to Self, Oriented to  Time ?Alcohol / Substance Use: Not Applicable ?Psych Involvement: No (comment) ? ?Admission diagnosis:  SOB (shortness of breath) [R06.02] ?Acute exacerbation of congestive heart failure ([place]) [I50.9] ?Patient Active Problem List  ? Diagnosis Date Noted  ? SOB (shortness of breath)   ? Acute diastolic heart failure ([place])   ? Hypertensive urgency   ? Shortness of breath   ? Acute pulmonary edema (HCC)   ? Unstable angina (HCC)   ? Acute exacerbation of congestive heart failure ([place]) 08/04/2021  ? HNP (herniated nucleus pulposus), lumbar 01/03/2014  ?  Class: Acute  ? Chest pain at rest 10/07/2013  ? HTN, goal below 140/80 07/22/2013  ? Chronic right hip pain 07/22/2013  ? Tobacco user 07/22/2013  ? ?PCP:  Patient, No Pcp Per (Inactive) ?Pharmacy:   ?[place] [place] [address] ?[place] [address] ?Phone: [phone] Fax: [phone] ? ?[id number] [place], [place] [address] ?[place] ?[place] [address] ?Phone: [phone] Fax: [phone] ? ? ?  ? ?Readmission Risk Interventions ?   ? View : No data to display.  ?  ?  ?  ? ? ? ?

## 2021-08-05 NOTE — Progress Notes (Signed)
? ?Subjective: ? ?Patient reports ongoing SOB, only mildly improved from yesterday. Endorses increased urination and has had improved leg swelling. Denies PND. He still needed to be upright to sleep last night but less so than usual. Endorses ongoing substernal chest pain and pleuritic chest pain, both stable from prior. He has had a new onset headache since yesterday, with pressure behind his right eye and possible associated lacrimation. Denies photophobia or phonophobia. Tylenol and ibuprofen have not helped his headache. He endorses feeling very nervous in the hospital because he usually prefers to avoid medical attention.  ? ?Objective: ? ?Vital signs in last 24 hours: ?Vitals:  ? 08/05/21 0130 08/05/21 0205 08/05/21 0300 08/05/21 0500  ?BP: (!) 171/99 (!) 157/104 (!) 163/96 (!) 168/108  ?Pulse:    76  ?Resp:    19  ?Temp:    98 ?F (36.7 ?C)  ?TempSrc:    Oral  ?SpO2:    93%  ?Weight:    105.9 kg  ?Height:      ? ?Weight change:  ? ?Intake/Output Summary (Last 24 hours) at 08/05/2021 0654 ?Last data filed at 08/05/2021 0500 ?Gross per 24 hour  ?Intake 225.41 ml  ?Output 3050 ml  ?Net -2824.59 ml  ? ?General: Lying in bed without any acute distress but becoming dyspneic after long sentences ?Neuro: A&O x3, normal affect ?Cardiovascular: Distant heart sounds but no obvious murmurs. Radial and DP pulses 2+ bilaterally. 1+ pitting edema to mid-calf bilaterally.  ?Pulmonary: Clear to auscultation bilaterally ?Abdominal: Abdomen soft and non-distended. Normoactive bowel sounds. No tenderness to palpation.  ?Skin: No obvious rashes, cuts, or bruises ?MSK: Normal ROM of all extremities. Strength 5/5 in bilateral UE and LE ? ? ?Assessment/Plan: ? ?Principal Problem: ?  Acute exacerbation of congestive heart failure (HCC) ?Active Problems: ?  HTN, goal below 140/80 ?  HNP (herniated nucleus pulposus), lumbar ? ?This is a 61 year old male with history of uncontrolled hypertension and tobacco use disorder who presents with  progressively worsening shortness of breath, consistent with CHF exacerbation and substernal chest pain, found to be secondary to CAD.  ? ?Acute decompensated HFmrEF ?Patient with history of smoking and uncontrolled hypertension presents with new onset dyspnea with pleuritic chest pain following a respiratory illness and has associated orthopnea, PND, and peripheral edema. Bibasilar crackles heard on initial exam, improved with diuresis. CXR on admission notable for interstitial edema and small bilateral pleural effusions. BNP 551. Troponin 22->22. Echo with LVEF 40-45%, wall motion abnormalities, dilated LV, mild LVH, and grade II diastolic dysfunction. Symptoms overall most consistent with acute decompensated heart failure. Cannot rule out underlying COPD given the long smoking history and improvement with duo-nebs but patient is unlikely to be in an acute COPD exacerbation at this time given lack of wheezing or oxygen requirement.  No signs of pneumonia.  ?- Cardiology following  ?- IV lasix 40 mg BID ?- Discontinue prednisone, guaifenesin, ceftriaxone, and azithromycin   ?- Continue PRN Duo nebs ?- Strict I/Os and daily weights ?- Outpatient PFTs for further evaluation of possible underlying COPD ? ?Coronary artery disease with anginal chest pain  NSTEMI ?Patient with uncontrolled chronic hypertension presents with history of substernal, non-radiating, pressure-like chest pain without identifiable aggravating or alleviating factors and occasionally occurring at rest. EKG with sinus rhythm, LVH, no ST elevations but new ST depressions and T wave inversions in the lateral leads. Troponin 22 -> 22. Left heart catheterization notable for 100% stenosis of left PDA with ulceration and 80-90% stenosis  of RCA. Right heart catheterization negative for pulmonary hypertension. Patient likely has hypertensive heart disease at baseline but new ST depressions and T wave inversions in lateral leads along with evidence of  vessel occlusion with ulceration overall most consistent with acute NSTEMI.  ?- Cardiology following ?- Continue DAPT and atorvastatin  ?- PRN tylenol  ?  ?Severe symptomatic hypertension ?Patient with chronic uncontrolled hypertension and not on any medications for >1 year presents with BP 198/123. EKG, troponins, and left heart catheterization consistent with acute NSTEMI.  ?- Continue amlodipine, losartan, and carvedilol  ?- Abdominal angiogram pending  ?- Continue diuresis ?  ?Anemia, mild ?- Continue to monitor ?  ?Tobacco use disorder  ?- Encourage smoking cessation ?- Nicotine patch ? ? LOS: 0 days  ? ?Jim Like, Medical Student ?08/05/2021, 6:54 AM ? ?

## 2021-08-05 NOTE — Progress Notes (Signed)
Heart Failure Navigator Progress Note ? ?Assessed for Heart & Vascular TOC clinic readiness.  ?Patient does not meet criteria due to seen by [facility].  ? ? ?Jamariyah Johannsen, BSN, RN ?Heart Failure Nurse Navigator ?Secure Chat Only   ?

## 2021-08-06 ENCOUNTER — Other Ambulatory Visit (HOSPITAL_COMMUNITY): Payer: Self-pay

## 2021-08-06 DIAGNOSIS — F17210 Nicotine dependence, cigarettes, uncomplicated: Secondary | ICD-10-CM | POA: Diagnosis not present

## 2021-08-06 DIAGNOSIS — I110 Hypertensive heart disease with heart failure: Secondary | ICD-10-CM | POA: Diagnosis not present

## 2021-08-06 DIAGNOSIS — I5043 Acute on chronic combined systolic (congestive) and diastolic (congestive) heart failure: Secondary | ICD-10-CM | POA: Diagnosis not present

## 2021-08-06 LAB — CBC
HCT: 45.8 % (ref 39.0–52.0)
Hemoglobin: 14.6 g/dL (ref 13.0–17.0)
MCH: 26.4 pg (ref 26.0–34.0)
MCHC: 31.9 g/dL (ref 30.0–36.0)
MCV: 83 fL (ref 80.0–100.0)
Platelets: 305 10*3/uL (ref 150–400)
RBC: 5.52 MIL/uL (ref 4.22–5.81)
RDW: 14 % (ref 11.5–15.5)
WBC: 9.8 10*3/uL (ref 4.0–10.5)
nRBC: 0 % (ref 0.0–0.2)

## 2021-08-06 LAB — BASIC METABOLIC PANEL
Anion gap: 10 (ref 5–15)
BUN: 24 mg/dL — ABNORMAL HIGH (ref 6–20)
CO2: 22 mmol/L (ref 22–32)
Calcium: 8.7 mg/dL — ABNORMAL LOW (ref 8.9–10.3)
Chloride: 106 mmol/L (ref 98–111)
Creatinine, Ser: 1.14 mg/dL (ref 0.61–1.24)
GFR, Estimated: >60 mL/min (ref >60)
Glucose, Bld: 111 mg/dL — ABNORMAL HIGH (ref 70–99)
Potassium: 4.5 mmol/L (ref 3.5–5.1)
Sodium: 138 mmol/L (ref 135–145)

## 2021-08-06 MED ORDER — PNEUMOCOCCAL 20-VAL CONJ VACC 0.5 ML IM SUSY
0.5000 mL | PREFILLED_SYRINGE | INTRAMUSCULAR | Status: DC
Start: 2021-08-07 — End: 2021-08-06

## 2021-08-06 MED ORDER — CLOPIDOGREL BISULFATE 75 MG PO TABS
75.0000 mg | ORAL_TABLET | Freq: Every day | ORAL | 2 refills | Status: DC
Start: 2021-08-07 — End: 2021-11-15
  Filled 2021-08-06 – 2021-08-30 (×2): qty 30, 30d supply, fill #0
  Filled 2021-10-12: qty 30, 30d supply, fill #1
  Filled 2021-10-13: qty 30, 30d supply, fill #0

## 2021-08-06 MED ORDER — FLUTICASONE PROPIONATE 50 MCG/ACT NA SUSP
2.0000 | Freq: Every day | NASAL | 2 refills | Status: DC
Start: 2021-08-07 — End: 2022-02-14
  Filled 2021-08-06 – 2021-11-03 (×3): qty 16, 30d supply, fill #0

## 2021-08-06 MED ORDER — FUROSEMIDE 40 MG PO TABS
40.0000 mg | ORAL_TABLET | Freq: Every day | ORAL | 2 refills | Status: DC
  Filled 2021-08-06 – 2021-08-30 (×2): qty 30, 30d supply, fill #0
  Filled 2021-10-12: qty 30, 30d supply, fill #1
  Filled 2021-10-13: qty 30, 30d supply, fill #0

## 2021-08-06 MED ORDER — CARVEDILOL 25 MG PO TABS
25.0000 mg | ORAL_TABLET | Freq: Two times a day (BID) | ORAL | 2 refills | Status: DC
  Filled 2021-08-06 – 2021-08-30 (×2): qty 60, 30d supply, fill #0
  Filled 2021-11-01: qty 60, 30d supply, fill #1

## 2021-08-06 MED ORDER — ATORVASTATIN CALCIUM 80 MG PO TABS
80.0000 mg | ORAL_TABLET | Freq: Every day | ORAL | 2 refills | Status: DC
  Filled 2021-08-06 – 2021-11-03 (×3): qty 30, 30d supply, fill #0

## 2021-08-06 MED ORDER — PNEUMOCOCCAL 20-VAL CONJ VACC 0.5 ML IM SUSY
0.5000 mL | PREFILLED_SYRINGE | INTRAMUSCULAR | Status: AC | PRN
  Administered 2021-08-06: 0.5 mL via INTRAMUSCULAR
  Filled 2021-08-06: qty 0.5

## 2021-08-06 MED ORDER — UMECLIDINIUM BROMIDE 62.5 MCG/ACT IN AEPB
1.0000 | INHALATION_SPRAY | Freq: Every day | RESPIRATORY_TRACT | 2 refills | Status: DC
  Filled 2021-08-06 – 2021-08-30 (×2): qty 30, 30d supply, fill #0

## 2021-08-06 MED ORDER — FUROSEMIDE 40 MG PO TABS
40.0000 mg | ORAL_TABLET | Freq: Every day | ORAL | Status: DC
  Administered 2021-08-06: 40 mg via ORAL
  Filled 2021-08-06: qty 1

## 2021-08-06 MED ORDER — ALBUTEROL SULFATE HFA 108 (90 BASE) MCG/ACT IN AERS
1.0000 | INHALATION_SPRAY | RESPIRATORY_TRACT | 2 refills | Status: DC | PRN
  Filled 2021-08-06 – 2021-08-30 (×2): qty 8.5, 16d supply, fill #0

## 2021-08-06 MED ORDER — SACUBITRIL-VALSARTAN 49-51 MG PO TABS
1.0000 | ORAL_TABLET | Freq: Two times a day (BID) | ORAL | 2 refills | Status: DC
  Filled 2021-08-06 – 2021-08-30 (×2): qty 60, 30d supply, fill #0

## 2021-08-06 MED ORDER — ASPIRIN 81 MG PO TBEC
81.0000 mg | DELAYED_RELEASE_TABLET | Freq: Every day | ORAL | 5 refills | Status: DC
  Filled 2021-08-06 – 2021-08-30 (×2): qty 30, 30d supply, fill #0
  Filled 2021-10-12: qty 30, 30d supply, fill #1
  Filled 2021-10-13: qty 30, 30d supply, fill #0
  Filled 2021-11-03: qty 30, 30d supply, fill #1
  Filled 2021-12-20: qty 30, 30d supply, fill #2
  Filled 2022-01-26: qty 30, 30d supply, fill #3

## 2021-08-06 MED ORDER — SPIRONOLACTONE 25 MG PO TABS
12.5000 mg | ORAL_TABLET | Freq: Every day | ORAL | 2 refills | Status: DC
  Filled 2021-08-06: qty 15, 30d supply, fill #0

## 2021-08-06 MED FILL — Heparin Sodium (Porcine) Inj 1000 Unit/ML: INTRAMUSCULAR | Qty: 10 | Status: AC

## 2021-08-06 NOTE — Discharge Summary (Signed)
? ?Name: Michael Bell ?MRN: [id number] ?DOB: Dec 09, 1960 61 y.o. ?PCP: Patient, No Pcp Per (Inactive) ? ?Date of Admission: 08/04/2021 11:14 AM ?Date of Discharge: 08/06/2021 ?Attending Physician: Dr. Antony Contras ? ?Discharge Diagnosis: ?Principal Problem: ?  Acute exacerbation of congestive heart failure (HCC) ?Active Problems: ?  HTN, goal below 130/80 ?  HNP (herniated nucleus pulposus), lumbar ?  Acute diastolic heart failure (HCC) ?  Hypertensive urgency ?  ? ?Discharge Medications: ?Allergies as of 08/06/2021   ?No Known Allergies ?  ? ?  ?Medication List  ?  ? ?STOP taking these medications   ? ?amLODipine 10 MG tablet ?Commonly known as: NORVASC ?  ?lisinopril-hydrochlorothiazide 20-12.5 MG tablet ?Commonly known as: ZESTORETIC ?  ?metoprolol succinate 50 MG 24 hr tablet ?Commonly known as: TOPROL-XL ?  ? ?  ? ?TAKE these medications   ? ?albuterol 108 (90 Base) MCG/ACT inhaler ?Commonly known as: VENTOLIN HFA ?Inhale 1-2 puffs into the lungs every 4 (four) hours as needed for wheezing or shortness of breath. ?  ?Aspirin Low Dose 81 MG EC tablet ?Generic drug: aspirin ?Take 1 tablet (81 mg total) by mouth daily. ?What changed: additional instructions ?  ?atorvastatin 80 MG tablet ?Commonly known as: LIPITOR ?Take 1 tablet (80 mg total) by mouth at bedtime. ?  ?carvedilol 25 MG tablet ?Commonly known as: COREG ?Take 1 tablet (25 mg total) by mouth 2 (two) times daily with a meal. ?  ?clopidogrel 75 MG tablet ?Commonly known as: PLAVIX ?Take 1 tablet (75 mg total) by mouth daily. ?Start taking on: Aug 07, 2021 ?  ?Entresto 49-51 MG ?Generic drug: sacubitril-valsartan ?Take 1 tablet by mouth 2 (two) times daily. ?  ?fluticasone 50 MCG/ACT nasal spray ?Commonly known as: FLONASE ?Place 2 sprays into both nostrils daily. ?Start taking on: Aug 07, 2021 ?  ?furosemide 40 MG tablet ?Commonly known as: LASIX ?Take 1 tablet (40 mg total) by mouth daily. ?Start taking on: Aug 07, 2021 ?  ?Incruse Ellipta 62.5 MCG/ACT  Aepb ?Generic drug: umeclidinium bromide ?Inhale 1 puff into the lungs daily. ?Start taking on: Aug 07, 2021 ?  ?spironolactone 25 MG tablet ?Commonly known as: ALDACTONE ?Take 0.5 tablets (12.5 mg total) by mouth daily. ?Start taking on: Aug 07, 2021 ?  ? ?  ? ? ?Disposition and follow-up:   ?[name] was discharged from [facility] in Stable condition.  At the hospital follow up visit please address: ? ?1.  Follow-up: ? a.  Heart failure: New diagnosis during hospitalization. Patient started on guideline directed medical therapy prior to discharge. Please reassess symptoms and ensure patient has his medications and taking them.  ?  ? b.  NSTEMI: Chest pain resolved by day of discharge. Heart cath showed occluded PDA. Cards recommended medical management. Ask about new chest pain and ensure patient is taking medications as prescribed. ? ? c.  Hypertension: Blood pressure was better controlled on new BP medication.  Please ensure patient is taking these new medications and check his BP. ? ? d.  Tobacco use/Possible COPD: Patient counseled on importance of smoking cessation and discharged home on inhalers.  Please ensure patient is using inhalers and order a PFT to formally diagnose COPD. Provider tobacco cessation resources.  ? ?2.  Labs / imaging needed at time of follow-up: None ? ?3.  Pending labs/ test needing follow-up: None ? ?Follow-up Appointments: ? Follow-up Information   ? ? [facility] Follow up.   ?Why:  Please utilize this pharmacy location for medications outpatient- Pharmacy is on the first floor. Medications range from $4.00-$10.00- Please ask for Clinical Social Work assistance if needed. ?Contact information: ?[address] ?[place] [address] ?[phone] ? ?  ?  ? ? [facility] Follow up on 08/13/2021.   ?Why: Please go to this appointment at 9:15 am. Please arrive 15 minutes before  your appointment. ?Contact information: ?[address] ?[place] [address] ?[phone] ? ?  ?  ? ? Tolia, Sunit, DO Follow up on 08/20/2021.   ?Specialties: Cardiology, Vascular Surgery ?Why: Go to this appointment at 2 pm. ?Contact information: ?[address] ?[address] ?[place] [address] ?[phone] ? ? ?  ?  ? ?  ?  ? ?  ? ? ?Hospital Course by problem list: ?This is a 61 year old male with history of uncontrolled hypertension, tobacco use disorder, and poor access to medical care who presented with progressively worsening shortness of breath and diagnosed with new HFrEF with acute decompensation. He was also found to have had an NSTEMI and was treated as per standard ACS protocol.  ? 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 Initial exam notable for volume overload with bibasilar crackles and 2+ pitting edema to mid calves bilaterally. CXR on admission notable for interstitial edema and small bilateral pleural effusions. BNP 551. Troponin 22->22. Echo with LVEF 40-45%, wall motion abnormalities, dilated LV, mild LVH, and grade II diastolic dysfunction. Patient was diuresed with IV lasix with significant symptomatic improvement. He was also started on carvedilol, losartan (later switched to [facility]), lasix and spironolactone. He will require outpatient follow up for close monitoring and financial assistance. Patient scheduled to follow up with Dr. Odis Hollingshead on May 26th at 2 pm.  ? ?NSTEMI  Coronary artery disease  Stable angina  ?Patient with uncontrolled chronic hypertension presented with history of substernal, non-radiating, pressure-like chest pain without identifiable aggravating or alleviating factors and occasionally occurring at rest. EKG with  sinus rhythm, LVH, no ST elevations but new ST depressions and T wave inversions in the lateral leads. Troponin 22 -> 22. Left heart catheterization notable for 100% stenosis of left PDA with ulceration, consistent with acute NSTEMI and 80-90% stenosis of RCA. Lipid panel with total cholesterol 133, HDL 43, and LDL 86 with direct LDL 93. Patient has evidence of ischemic cardiomyopathy, hypertension, coronary artery disease, hyperlipidemia, and has now experienced an acute cardiovascular event. He was treated acutely with nitroglycerin drip and heparin, which were later discontinued. He was started on DAPT to be continued for 12 months, as well as carvedilol, entresto, and high dose atorvastatin, to be continued indefinitely. He will need close outpatient monitoring to ensure BP <130/80 and LDL <70 per secondary prevention recommendations.  ? ?Severe symptomatic hypertension  ?Patient with history of hypertension presented with BP 198/123 after being off all antihypertensives for > 1 year due to loss of insurance. While admitted, he was treated with amlodipine, carvedilol, and losartan. He is being discharged on carvedilol, entresto, and spironolactone. BP goal <130/80. ? ?Tobacco use disorder with concern for COPD ?Patient with history of heavy tobacco use presented with shortness of breath. He was initially started on medical management of COPD exacerbation with azithromycin, steroids, and duonebs but this was discontinued because  presentation was overall most consistent with CHF exacerbation. Patient did however experience relief with duonebs and given smoking history and onset of shortness of breath following viral illness, we suspect underlying COPD. Right heart catheterization negative for significant pulmonary hypertension. Patient was also given pneumococcal vaccine while admitted. He is being discharged on umeclidium and albuterol inhalers, to be used as needed while awaiting diagnostic COPD workup. He will  require close outpatient monitoring and PFTs. ? ?Abdominal aortic aneurysm, asymptomatic  ?Patient incidentally noted to have possible aortic aneurysm during attempted abdominal aortogram while undergoing left heart

## 2021-08-06 NOTE — Progress Notes (Addendum)
Heart Failure Stewardship Pharmacist Progress Note ? ? ?PCP: Patient, No Pcp Per (Inactive) ?PCP-Cardiologist: None  ? ?HPI:  ?61 yo male with PMH of uncontrolled HTN, CAD with aortic calcifications, emphysema, active tobacco use (smokes 2-3 PPD), excessive alcohol consumption (20 beers/day + liquor), marijuana use, and medication noncompliance for at leat one year. Presented to ED with persistent chest pain for 1 month, bilateral LEE and progressive SOB requiring 3 pillow orthopnea and eventually sleeping in a recliner. CXR pleural effusion interstitial edema with mild cardiomegaly. Echo with new mildly reduced EF 40-45%, dilated LV, normal RV with mildly enlarged RV wall thickness and moderate biatrial dilation. Underwent [facility] 08/05/21. Significant RCA disease of proximal RCA 80%, distal RCA 90%. RHC revealed elevated pulmonary pressures (32/19, mean 23), elevated wedge, adequate CO/CI (7.8/3.3) ? ?Current HF Medications: ?Diuretic: furosemide 40 mg PO daily ?Beta Blocker: carvedilol 25 mg twice daily ?ACE/ARB/ARNI: Delene Loll 49-51 mg twice daily ?Aldosterone Antagonist: spironolactone 12.5 mg daily ? ?Prior to admission HF Medications: ?Beta blocker: metoprolol succinate 50 mg BID (not taking) ? ?Pertinent Lab Values: ?Serum creatinine , BUN 18, Potassium 3.6, Sodium 138, BNP 550, Magnesium 1.8, A1c 5.6% ? ?Vital Signs: ?Weight: 233 lbs (admission weight: 240 lbs) ?Blood pressure: 150s/100s  ?Heart rate: 70-80s  ?I/O: -1.7L yesterday; net -4.1L ? ?Medication Assistance / Insurance Benefits Check: ?Does the patient have prescription insurance?  No ? ?Outpatient Pharmacy:  ?Prior to admission outpatient pharmacy: nonadherent x1 year ?Is the patient willing to use [place] pharmacy at discharge? Yes ?Is the patient willing to transition their outpatient pharmacy to utilize a [facility] outpatient pharmacy?   Yes ? ?Assessment: ?1. 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 ?- Continue furosemide PO 40 mg  daily ?- Continue carvedilol 25 mg twice daily ?- Continue Entresto 49-51 mg twice daily ?- Continue spironolactone 12.5 mg daily ?  ?Plan: ?1) Medication changes recommended at this time: ?- No recommendations - on appropriate GDMT. Discharging today. ? ?2) Patient assistance: ?- Application in progress for [employer] assistance. Spoke with patient about bringing forms and tax documents to follow-up cardiology appointment with Dr. Einar Gip. ? ?3)  Education  ?- Patient has been educated on current HF medications and potential additions to HF medication regimen ?- Patient verbalizes understanding that over the next few months, these medication doses may change and more medications may be added to optimize HF regimen ?- Patient has been educated on basic disease state pathophysiology and goals of therapy ?- Ensure amlodipine is stopped at discharge ? ?Laurey Arrow, PharmD ?PGY1 Pharmacy Resident ?08/06/2021  11:46 AM ?

## 2021-08-06 NOTE — Care Management (Signed)
1145 08-06-21 Case Manager just asked pharmacy tech to check the [employer] Insurance and the plan is active. Case Manager will not be able to [facility] the patient with medications. If the patient is discharged home today; medications will need to be sent to [facility] for delivery to the bedside.  ?

## 2021-08-06 NOTE — Hospital Course (Addendum)
This is a 61 year old male with history of uncontrolled hypertension, tobacco use disorder, and poor access to medical care who presented with progressively worsening shortness of breath and diagnosed with new HFrEF with acute decompensation. He was also found to have had an NSTEMI and was treated as per standard ACS protocol.  ? 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 Initial exam notable for volume overload with bibasilar crackles and 2+ pitting edema to mid calves bilaterally. CXR on admission notable for interstitial edema and small bilateral pleural effusions. BNP 551. Troponin 22->22. Echo with LVEF 40-45%, wall motion abnormalities, dilated LV, mild LVH, and grade II diastolic dysfunction. Patient was diuresed with IV lasix with significant symptomatic improvement. He was also started on carvedilol, losartan (later switched to [facility]), lasix and spironolactone. He will require outpatient follow up for close monitoring and financial assistance. Patient scheduled to follow up with Dr. Odis Hollingshead on May 26th at 2 pm.  ? ?NSTEMI  Coronary artery disease  Stable angina  ?Patient with uncontrolled chronic hypertension presented with history of substernal, non-radiating, pressure-like chest pain without identifiable aggravating or alleviating factors and occasionally occurring at rest. EKG with sinus rhythm, LVH, no ST elevations but new ST depressions and T wave inversions in the lateral leads. Troponin 22 -> 22. Left heart catheterization notable for 100% stenosis of left PDA with ulceration, consistent with acute NSTEMI and 80-90% stenosis of RCA. Lipid panel with total cholesterol 133, HDL 43, and LDL 86 with direct LDL 93. Patient has evidence of ischemic cardiomyopathy,  hypertension, coronary artery disease, hyperlipidemia, and has now experienced an acute cardiovascular event. He was treated acutely with nitroglycerin drip and heparin, which were later discontinued. He was started on DAPT to be continued for 12 months, as well as carvedilol, entresto, and high dose atorvastatin, to be continued indefinitely. He will need close outpatient monitoring to ensure BP <130/80 and LDL <70 per secondary prevention recommendations.  ? ?Severe symptomatic hypertension  ?Patient with history of hypertension presented with BP 198/123 after being off all antihypertensives for > 1 year due to loss of insurance. While admitted, he was treated with amlodipine, carvedilol, and losartan. He is being discharged on carvedilol, entresto, and spironolactone. BP goal <130/80. ? ?Tobacco use disorder with concern for COPD ?Patient with history of heavy tobacco use presented with shortness of breath. He was initially started on medical management of COPD exacerbation with azithromycin, steroids, and duonebs but this was discontinued because presentation was overall most consistent with CHF exacerbation. Patient did however experience relief with duonebs and given smoking history and onset of shortness of breath following viral illness, we suspect underlying COPD. Right heart catheterization negative for significant pulmonary hypertension. Patient was also given pneumococcal vaccine while admitted. He is being discharged on umeclidium and albuterol inhalers, to be used as needed while awaiting diagnostic COPD workup. He will require close outpatient monitoring and PFTs. ? ?Abdominal aortic aneurysm, asymptomatic  ?Patient incidentally noted to have possible aortic aneurysm during attempted abdominal aortogram while undergoing left heart catheterization for NSTEMI. Subsequent abdominal ultrasound was obtained and notable for 3.7 cm infrarenal abdominal aortic aneurysm. He will require a repeat ultrasound for  monitoring in 2 years.  ? ?

## 2021-08-06 NOTE — Discharge Instructions (Addendum)
Mr. Klang,  ? ?It was a pleasure taking care of you while you were in the hospital and I am glad that you are feeling better. You were admitted for trouble breathing and found to have heart failure, a small heart attack, blockages in your blood vessels, and high blood pressure. We also suspect that you have COPD but we cannot officially make that diagnosis in a hospital setting.  ? ?Due to your heart failure, you were found to have excessive fluid buildup in your lungs and in your legs, which we managed with a fluid pill. Going forward, it will be important for you to take all of your prescribed medicines to help your heart function as well as possible. It will also be important for you to have close outpatient follow up with your doctor so they can help you manage your medical conditions. Lastly, we highly recommend that you stop all smoking and alcohol use.  ? ?Follow up:  ?- [facility] (Dr. Darrick Huntsman) 5/19 at 9:15 am ?- [facility] (Dr. Odis Hollingshead) on 5/26 at 2 pm ? ?Please start taking the following medicines listed on your medication list.  ?- Spironolactone 25 mg, take 0.5 tabs (12.5 mg) daily ?- Entresto (sacubitril-valsartan) 49-51 mg twice daily ?- Carvedilol 25 mg twice daily ?- Lasix 40 mg daily ?- Aspirin 81 mg and Plavix 75 mg daily ?- Lipitor 80 mg daily at bedtime ?- Incruse Ellipta, 1 puff into lungs daily ?- Flonase, 2 sprays into both nostrils daily ?- Albuterol inhaler, 1 to 2 puffs into lungs every 4 hours as needed for shortness of breath or wheezing. ? ? ?

## 2021-08-06 NOTE — Progress Notes (Signed)
Progress Note ? ?Patient Name: Michael Bell ?Date of Encounter: 08/06/2021 ? ?Attending physician: No att. providers found ?Primary care provider: Patient, No Pcp Per (Inactive) ? ?Subjective: ?Michael Bell is a 61 y.o. male who was seen and examined at bedside  ?Denies chest pain. ?Shortness of breath significantly improved. ?Wishes to go home ?Case discussed and reviewed with his nurse. ? ?Objective: ?Vital Signs in the last 24 hours: ?Temp:  [98 ?F (36.7 ?C)-98.4 ?F (36.9 ?C)] 98 ?F (36.7 ?C) (05/12 1156) ?Pulse Rate:  [63-74] 68 (05/12 1156) ?Resp:  [15-20] 15 (05/12 1156) ?BP: (131-145)/(98-106) 131/99 (05/12 1156) ?SpO2:  [95 %-99 %] 96 % (05/12 1156) ?Weight:  [102.1 kg] 102.1 kg (05/12 0451) ? ?Intake/Output: ? ?Intake/Output Summary (Last 24 hours) at 08/06/2021 2341 ?Last data filed at 08/06/2021 1157 ?Gross per 24 hour  ?Intake --  ?Output 2850 ml  ?Net -2850 ml  ?  ?Net IO Since Admission: -7,030.76 mL [08/06/21 2341] ? ?Weights:  ?Filed Weights  ? 08/04/21 1534 08/05/21 0500 08/06/21 0451  ?Weight: 108.9 kg 105.9 kg 102.1 kg  ? ? ?Telemetry: Personally reviewed.  Normal sinus without dysrhythmias ? ?Physical examination: ?PHYSICAL EXAM: ? ?  08/06/2021  ? 11:56 AM 08/06/2021  ?  7:55 AM 08/06/2021  ?  4:51 AM  ?Vitals with BMI  ?Weight   225 lbs 1 oz  ?BMI   28.13  ?Systolic 131 145 003  ?Diastolic 99 100 106  ?Pulse 68 63 74  ? ? ?CONSTITUTIONAL: Appears older than stated age, hemodynamically stable, no acute distress.   ?SKIN: Skin is warm and dry. No rash noted. No cyanosis. No pallor. No jaundice.  Tattoos present. ?HEAD: Normocephalic and atraumatic.  ?EYES: No scleral icterus ?MOUTH/THROAT: Moist oral membranes.  ?NECK: No JVD present. No thyromegaly noted. No carotid bruits  ?CHEST Normal respiratory effort. No intercostal retractions  ?LUNGS: Clear to auscultation bilaterally.  No stridor. No wheezes. No rales.  ?CARDIOVASCULAR: Regular rate and rhythm, positive S1-S2, soft holosystolic murmur, no  gallops or rubs. ?ABDOMINAL: Obese, soft, nontender, nondistended, positive bowel sounds in all 4 quadrants, no apparent ascites.  ?EXTREMITIES: Trace bilateral pitting edema, warm to touch, 2+ bilateral DP and PT pulses ?HEMATOLOGIC: No significant bruising ?NEUROLOGIC: Oriented to person, place, and time. Nonfocal. Normal muscle tone.  ?PSYCHIATRIC: Normal mood and affect. Normal behavior. Cooperative ? ?Lab Results: ?Hematology ?Recent Labs  ?Lab 08/04/21 ?1135 08/05/21 ?7048 08/05/21 ?8891 08/05/21 ?6945 08/05/21 ?0388 08/06/21 ?1013  ?WBC 8.2 7.8  --   --   --  9.8  ?RBC 4.79 4.89  --   --   --  5.52  ?HGB 12.7* 13.2   < > 13.6 13.6 14.6  ?HCT 40.1 40.6   < > 40.0 40.0 45.8  ?MCV 83.7 83.0  --   --   --  83.0  ?MCH 26.5 27.0  --   --   --  26.4  ?MCHC 31.7 32.5  --   --   --  31.9  ?RDW 13.7 14.0  --   --   --  14.0  ?PLT 226 275  --   --   --  305  ? < > = values in this interval not displayed.  ? ? ?Chemistry ?Recent Labs  ?Lab 08/04/21 ?1135 08/05/21 ?8280 08/05/21 ?0349 08/05/21 ?1791 08/05/21 ?5056 08/06/21 ?1013  ?NA 139 138   < > 141 140 138  ?K 3.9 3.6   < > 3.2* 3.3* 4.5  ?CL 111  105  --   --   --  106  ?CO2 23 26  --   --   --  22  ?GLUCOSE 122* 147*  --   --   --  111*  ?BUN 15 18  --   --   --  24*  ?CREATININE 1.03 1.18  --   --   --  1.14  ?CALCIUM 8.6* 8.8*  --   --   --  8.7*  ?GFRNONAA >60 >60  --   --   --  >60  ?ANIONGAP 5 7  --   --   --  10  ? < > = values in this interval not displayed.  ?  ? ?Cardiac Enzymes: ?Cardiac Panel (last 3 results) ?Recent Labs  ?  08/04/21 ?1502 08/04/21 ?1856  ?TROPONINIHS 22* 22*  ? ? ?BNP (last 3 results) ?Recent Labs  ?  01/11/21 ?1629 08/04/21 ?1135  ?BNP 21.4 550.5*  ? ? ?ProBNP (last 3 results) ?No results for input(s): PROBNP in the last 8760 hours. ? ? ?DDimer No results for input(s): DDIMER in the last 168 hours.  ? ?Hemoglobin A1c:  ?Lab Results  ?Component Value Date  ? HGBA1C 5.6 08/04/2021  ? MPG 114.02 08/04/2021  ? ? ?TSH  ?Recent Labs  ?   08/04/21 ?1856  ?TSH 0.644  ? ? ?Lipid Panel  ?   ?Component Value Date/Time  ? CHOL 133 08/05/2021 0447  ? TRIG 22 08/05/2021 0447  ? HDL 43 08/05/2021 0447  ? CHOLHDL 3.1 08/05/2021 0447  ? VLDL 4 08/05/2021 0447  ? [place] 86 08/05/2021 0447  ? LDLDIRECT 93.6 08/04/2021 1900  ? ? ?Imaging: ?CARDIAC CATHETERIZATION ? ?Result Date: 08/05/2021 ?Right and left heart catheterization 08/05/2021: RA 7/6, mean 4 mmHg RV 30/4, EDP 13 mmHg PA 32/19, mean 23 mmHg, PA saturation 74%. PW 17/17, mean 14 mmHg.  Aortic saturation 96%. QP/QS 1.00. CO 7.89, CI 3.37 by Fick. LV 126/69, EDP 16 mmHg.  Ao 130/84, mean 106 male, acute.  There was no pressure gradient across aortic valve.  Mildly elevated EDP. LV: Appears mildly dilated.  LVEF appears to be at lower limit of normal around 55%. RCA: Nondominant.  However gives a large RV branch is sent acute marginal branch, the proximal RCA has 80% stenosis followed by distal RCA about 90% long stenosis. LM: Large vessel, no disease. LAD: Large vessel, gives origin to small diagonals.  No significant disease. RI: Large vessel, no significant disease. Lateral RI: Large vessel, no significant disease. CX: Large and dominant.  Gives origin to small obtuse marginals.  Distal circumflex continues as PDA and PL branch.  The distal PDA branch is occluded and appears ulcerated. Abdominal aortogram attempted: Full visualization.  There appears to be a suprarenal abdominal attic aneurysm.  Renal arteries could not be well visualized.  Aortoiliac bifurcation is widely patent.  Aorta has mild calcific atherosclerotic changes.  Aorta is mildly tortuous. Impression: Suspect EKG abnormalities related to distal left PDA occlusion.  Right coronary artery stenosis is inconsequential and essentially supplies the right ventricle and should not cause any significant issues.  We will start him on Plavix in view of ACS with what appears to be an ulcerated occlusion of the left PDA. Right heart catheterization  reveals no significant pulmonary hypertension, Qp/Qs. Continue aggressive risk modification, will order abdominal aortic duplex to exclude aneurysm.  110 mL contrast utilized.  ? ?US AORTA ? ?Result Date: 08/05/2021 ?CLINICAL DATA:  Abdominal aortic atherosclerotic  disease. EXAM: ULTRASOUND OF ABDOMINAL AORTA TECHNIQUE: Ultrasound examination of the abdominal aorta and proximal common iliac arteries was performed to evaluate for aneurysm. Additional color and Doppler images of the distal aorta were obtained to document patency. COMPARISON:  None Available. FINDINGS: Abdominal aortic measurements as follows: Proximal:  2.4 cm Mid:  2.6 cm Distal:  3.7 cm Patent: Yes, peak systolic velocity is [id number] cm/s Right common iliac artery: 1.5 cm Left common iliac artery: 1.6 cm IMPRESSION: Infrarenal abdominal aortic aneurysm 3.7 cm. Recommend follow-up ultrasound every 2 years. This recommendation follows ACR consensus guidelines: White Paper of the ACR Incidental Findings Committee II on Vascular Findings. J Am Coll Radiol 2013; 10:789-794. Electronically Signed   By: Franchot Gallo M.D.   On: 08/05/2021 19:37  ? ?ECHOCARDIOGRAM COMPLETE ? ?Result Date: 08/05/2021 ?   ECHOCARDIOGRAM REPORT   Patient Name:   KARON BRACEY Date of Exam: 08/05/2021 Medical Rec #:  [id number]    Height:       75.0 in Accession #:    [id number]   Weight:       233.5 lb Date of Birth:  [birth year]    BSA:          2.344 m? Patient Age:    22 years     BP:           163/96 mmHg Patient Gender: M            HR:           80 bpm. Exam Location:  Inpatient Procedure: 2D Echo, Cardiac Doppler and Color Doppler Indications:     Dyspnea  History:         Patient has no prior history of Echocardiogram examinations.                  Risk Factors:Hypertension and Current Smoker.  Sonographer:     Jyl Heinz Referring Phys:  [id number] MADISON KOMMOR Diagnosing Phys: Rex Kras DO IMPRESSIONS  1. Left ventricular ejection fraction, by estimation, is 40 to 45%. The  left ventricle has mildly decreased function. The left ventricle demonstrates regional wall motion abnormalities (see scoring diagram/findings for description). The left ventricular  internal cavity size was dil

## 2021-08-07 LAB — LIPOPROTEIN A (LPA): Lipoprotein (a): 205.8 nmol/L — ABNORMAL HIGH (ref <75.0)

## 2021-08-13 ENCOUNTER — Encounter: Payer: Self-pay | Admitting: Internal Medicine

## 2021-08-13 ENCOUNTER — Ambulatory Visit (INDEPENDENT_AMBULATORY_CARE_PROVIDER_SITE_OTHER): Payer: 59 | Admitting: Internal Medicine

## 2021-08-13 VITALS — BP 131/87 | HR 54 | Temp 97.9°F | Ht 75.0 in | Wt 243.0 lb

## 2021-08-13 DIAGNOSIS — I5022 Chronic systolic (congestive) heart failure: Secondary | ICD-10-CM | POA: Diagnosis not present

## 2021-08-13 DIAGNOSIS — Z789 Other specified health status: Secondary | ICD-10-CM

## 2021-08-13 DIAGNOSIS — Z23 Encounter for immunization: Secondary | ICD-10-CM | POA: Insufficient documentation

## 2021-08-13 DIAGNOSIS — F17210 Nicotine dependence, cigarettes, uncomplicated: Secondary | ICD-10-CM | POA: Diagnosis not present

## 2021-08-13 DIAGNOSIS — Z720 Tobacco use: Secondary | ICD-10-CM

## 2021-08-13 DIAGNOSIS — R0602 Shortness of breath: Secondary | ICD-10-CM

## 2021-08-13 DIAGNOSIS — I5023 Acute on chronic systolic (congestive) heart failure: Secondary | ICD-10-CM | POA: Insufficient documentation

## 2021-08-13 NOTE — Assessment & Plan Note (Signed)
Vaccine given

## 2021-08-13 NOTE — Assessment & Plan Note (Addendum)
Mildly reduced EF of 40-45%. Started on GDMT after recent hospitalization with hypertensive emergency/NSTEMI. Cath at that time did show CAD but was not a candidate for stenting. Recommended to be on DAPT for 1 year. Euvolemic on exam today with no exacerbation of symptoms. - continue DAPT - continue medications as recommended by cardiology, next f/u with cards on 5/26

## 2021-08-13 NOTE — Assessment & Plan Note (Signed)
Patient says that he has stopped smoking since his recent hospitalization. Does not feel that he needs any assistance with quitting at this time. Provided encouragement. Given long smoking history and shortness of breath would benefit form PFTs.  - f/u PFTs - continue cessation counseling at f/u

## 2021-08-13 NOTE — Patient Instructions (Signed)
Michael Bell  It was a pleasure seeing you in the clinic today.   We talked about your heart failure, smoking, and your overall health.  Heart failure- great job taking all of your medication. Keep up the hard work  Smoking- I am proud of you for quitting smoking. We will order those lung functions tests to measure your breathing.   Please call our clinic at [phone] if you have any questions or concerns. The best time to call is Monday-Friday from 9am-4pm, but there is someone available 24/7 at the same number. If you need medication refills, please notify your pharmacy one week in advance and they will send [place] a request.   Thank you for letting us take part in your care. We look forward to seeing you next time!

## 2021-08-13 NOTE — Progress Notes (Signed)
   CC: establish care  HPI:  [name] is a 61 y.o. PMH noted below, who presents to the [facility] to establish care. To see the management of his acute and chronic conditions, please refer to the A&P note under the encounters tab.   Past Medical History:  Diagnosis Date   Arthritis    Headache    Hypertension    Family History  Problem Relation Age of Onset   Ovarian cancer Mother 35       primary peritoneal cancer   Cancer Mother    Diabetes Mother    Heart disease Mother    Hyperlipidemia Mother    Hypertension Mother    Stroke Mother    Mental illness Mother    Colon polyps Father        55 on last colonoscopy   Heart disease Father    Hyperlipidemia Father    Hypertension Father    Stroke Father    Colon cancer Sister 26       cancer of the sigmoid colon   COPD Maternal Grandmother    Stomach cancer Paternal Grandmother    Down syndrome Paternal Aunt    Cancer Paternal Uncle        cancer of the spine   Heart disease Brother    Hypertension Brother    Heart disease Brother    Hyperlipidemia Brother    Hypertension Brother    Cancer Brother    Heart disease Brother    Mental illness Brother    Hypertension Brother    Social History: Lives with wife. Working as a Music therapist, quit smoking a couple of weeks ago, smokes marijuana, drinks alcohol daily (as much as a 12 pack in a day), has gone to [employer] in the past and did quit drinking for 7 years, no history of withdrawals, has cut back on drinking since hospitalization  Review of Systems:  positive for shortness of breath, leg swelling, cough; negative for chest pain  Physical Exam: Gen: middle aged man in NAD HEENT: normocephalic atraumatic, MMM CV: RRR, no m/r/g   Resp: barrel shaped chest, CTAB, normal WOB  GI: soft, nontender MSK: moves all extremities without difficulty Skin:warm and dry, trace lower extremity edema Neuro:alert answering questions appropriately Psych: normal  affect   Assessment & Plan:   See Encounters Tab for problem based charting.  Patient discussed with Dr. Mikey Bussing

## 2021-08-13 NOTE — Assessment & Plan Note (Signed)
Patient drinks daily, wife is concerned about the amount he is drinking. Says that he drinks daily and can drink a 12 pack of beer as well as a couple of shots in one day. Has gone to AA in the past and did quit drinking for 7 years but started drinking again some time ago. Denies history of withdrawals. Discussed recommendations on healthy drinking amounts and that based on how much he is consuming he is high risk for adverse health effects. Patient says that he has cut back on his drinking since hospitalization. Patient is contemplative on cutting back but has had a lot of health changes lately with new HF diagnosis. Discussed that we could continue to talk about this at his follow up visit and provide resources if he is interested in medications to help with cravings or to reduce his drinking. - continue alcohol discussions at f/u

## 2021-08-17 NOTE — Progress Notes (Signed)
[facility] Clinic Attending  Case discussed with the resident at the time of the visit.  We reviewed the resident's history and exam and pertinent patient test results.  I agree with the assessment, diagnosis, and plan of care documented in the resident's note.  

## 2021-08-20 ENCOUNTER — Ambulatory Visit: Payer: 59 | Admitting: Cardiology

## 2021-08-20 ENCOUNTER — Encounter: Payer: Self-pay | Admitting: Cardiology

## 2021-08-20 VITALS — BP 120/79 | HR 62 | Temp 98.1°F | Resp 16 | Ht 75.0 in | Wt 236.6 lb

## 2021-08-20 DIAGNOSIS — I2510 Atherosclerotic heart disease of native coronary artery without angina pectoris: Secondary | ICD-10-CM

## 2021-08-20 DIAGNOSIS — I700 Atherosclerosis of aorta: Secondary | ICD-10-CM

## 2021-08-20 DIAGNOSIS — Z87891 Personal history of nicotine dependence: Secondary | ICD-10-CM

## 2021-08-20 DIAGNOSIS — I5042 Chronic combined systolic (congestive) and diastolic (congestive) heart failure: Secondary | ICD-10-CM

## 2021-08-20 DIAGNOSIS — I214 Non-ST elevation (NSTEMI) myocardial infarction: Secondary | ICD-10-CM

## 2021-08-20 DIAGNOSIS — I10 Essential (primary) hypertension: Secondary | ICD-10-CM

## 2021-08-20 DIAGNOSIS — I7143 Infrarenal abdominal aortic aneurysm, without rupture: Secondary | ICD-10-CM

## 2021-08-20 MED ORDER — DAPAGLIFLOZIN PROPANEDIOL 10 MG PO TABS: 10.0000 mg | ORAL_TABLET | Freq: Every day | ORAL | 0 refills | Status: DC

## 2021-08-20 NOTE — Progress Notes (Signed)
ID:  Michael Bell, DOB 1960-04-14, MRN [id number]  PCP:  Patient, No Pcp Per (Inactive)  Cardiologist:  Tessa Lerner, DO, [facility] (established care 08/04/2021)  Date: 08/20/21  Chief Complaint  Patient presents with  . Shortness of Breath  . NSTEMI   . New Patient (Initial Visit)    HPI  Michael Bell is a 61 y.o. Caucasian male whose past medical history and cardiovascular risk factors include: NSTEMI (May 2023), hypertension, former smoker (started at the age of 67 averaging 2 to 3 packs/day until the age of 24), excessive alcohol consumption, marijuana use, coronary and aortic atherosclerosis (nongated CT study 12/2020), emphysema, infrarenal aortic aneurysm.  Patient presented to the hospital with chief complaint of chest pain and shortness of breath and ruled in for non-STEMI.  He eventually underwent left and right heart catheterization did not require coronary intervention results reviewed and noted below for further reference.  Echocardiogram noted mildly reduced LVEF and grade 2 diastolic dysfunction.  He was started on guideline directed medical therapy and now presents for posthospitalization follow-up and establish care.  Since discharge patient has been taking his medications regularly.  His blood pressures have improved significantly prior to this his systolic blood pressures would be as high as 190-200 mmHg.  He also has stopped smoking for the last 3 weeks for which she is congratulated for today's visit.  He denies anginal discomfort.  But continues to have shortness of breath dominantly with effort related activities.  The dyspnea on exertion is less intense since discharge but still present.  Denies heart failure symptoms.  He is scheduled for PFT next week.  He has not establish care with pulmonary medicine given the extensive history of smoking and imaging findings concerning for emphysema.  Patient is accompanied by his wife at today's visit.  She provides collateral history as  part of today's encounter.  FUNCTIONAL STATUS: No structured exercise program or daily routine.   ALLERGIES: No Known Allergies  MEDICATION LIST PRIOR TO VISIT: Current Meds  Medication Sig  . albuterol (VENTOLIN HFA) 108 (90 Base) MCG/ACT inhaler Inhale 1-2 puffs into the lungs every 4 (four) hours as needed for wheezing or shortness of breath.  Marland Kitchen aspirin 81 MG EC tablet Take 1 tablet (81 mg total) by mouth daily.  Marland Kitchen atorvastatin (LIPITOR) 80 MG tablet Take 1 tablet (80 mg total) by mouth at bedtime.  . carvedilol (COREG) 25 MG tablet Take 1 tablet (25 mg total) by mouth 2 (two) times daily with a meal.  . clopidogrel (PLAVIX) 75 MG tablet Take 1 tablet (75 mg total) by mouth daily.  . dapagliflozin propanediol (FARXIGA) 10 MG TABS tablet Take 1 tablet (10 mg total) by mouth daily before breakfast.  . fluticasone (FLONASE) 50 MCG/ACT nasal spray Place 2 sprays into both nostrils daily.  . furosemide (LASIX) 40 MG tablet Take 1 tablet (40 mg total) by mouth daily.  . sacubitril-valsartan (ENTRESTO) 49-51 MG Take 1 tablet by mouth 2 (two) times daily.  Marland Kitchen spironolactone (ALDACTONE) 25 MG tablet Take 0.5 tablets (12.5 mg total) by mouth daily. (Patient taking differently: Take 25 mg by mouth daily.)  . umeclidinium bromide (INCRUSE ELLIPTA) 62.5 MCG/ACT AEPB Inhale 1 puff into the lungs daily.     PAST MEDICAL HISTORY: Past Medical History:  Diagnosis Date  . Arthritis   . CHF (congestive heart failure) (HCC)   . Headache   . Hypertension     PAST SURGICAL HISTORY: Past Surgical History:  Procedure  Laterality Date  . 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 NOSE SURGERY     40 yrs ago  . RIGHT/LEFT HEART CATH AND CORONARY ANGIOGRAPHY N/A 08/05/2021   Procedure: RIGHT/LEFT HEART CATH AND CORONARY  ANGIOGRAPHY;  Surgeon: Yates Decamp, MD;  Location: [facility] INVASIVE CV LAB;  Service: Cardiovascular;  Laterality: N/A;    FAMILY HISTORY: The patient family history includes COPD in his maternal grandmother; Cancer in his mother, paternal uncle, and sister; Colon cancer (age of onset: 81) in his sister; Colon polyps in his father; Diabetes in his mother; Down syndrome in his paternal aunt; Heart disease in his brother, brother, father, and mother; Hyperlipidemia in his brother, father, and mother; Hypertension in his brother, brother, father, and mother; Mental illness in his mother; Ovarian cancer (age of onset: 61) in his mother; Stomach cancer in his paternal grandmother; Stroke in his father and mother.  SOCIAL HISTORY:  The patient  reports that he has quit smoking. His smoking use included cigarettes. He has never used smokeless tobacco. He reports that he does not currently use alcohol. He reports current drug use. Drug: Marijuana.  REVIEW OF SYSTEMS: Review of Systems  Constitutional: Negative for chills and fever.  HENT:  Negative for hoarse voice and nosebleeds.   Eyes:  Negative for discharge, double vision and pain.  Cardiovascular:  Positive for dyspnea on exertion. Negative for chest pain, claudication, leg swelling, near-syncope, orthopnea, palpitations, paroxysmal nocturnal dyspnea and syncope.  Respiratory:  Positive for cough, shortness of breath and sputum production. Negative for hemoptysis.   Musculoskeletal:  Negative for muscle cramps and myalgias.  Gastrointestinal:  Negative for abdominal pain, constipation, diarrhea, hematemesis, hematochezia, melena, nausea and vomiting.  Neurological:  Negative for dizziness and light-headedness.   PHYSICAL EXAM:    08/20/2021    2:06 PM 08/13/2021    9:36 AM 08/06/2021   11:56 AM  Vitals with BMI  Height 6\' 3"  6\' 3"    Weight 236 lbs 10 oz 243 lbs   BMI 29.57 30.37   Systolic 120 131  Diastolic 79 87 99  Pulse 62 54 68     CONSTITUTIONAL: Appears older than stated age, hemodynamically stable, no acute distress.  SKIN: Skin is warm and dry. No rash noted. No cyanosis. No pallor. No jaundice.  Tattoos present. HEAD: Normocephalic and atraumatic.  EYES: No scleral icterus MOUTH/THROAT: Moist oral membranes.  NECK: No JVD present. No thyromegaly noted. No carotid bruits  CHEST Normal respiratory effort. No intercostal retractions  LUNGS: Clear to auscultation bilaterally.  No stridor. No wheezes. No rales.  CARDIOVASCULAR: Regular, positive S1-S2, subtle systolic murmur heard at the apex, no gallops or rubs.   ABDOMINAL: Obese, soft, nontender, nondistended, positive bowel sounds in all 4 quadrants, no apparent ascites.  EXTREMITIES: No peripheral edema, warm to touch, 2+ bilateral DP and PT pulses HEMATOLOGIC: No significant bruising NEUROLOGIC: Oriented to person, place, and time. Nonfocal. Normal muscle tone.  PSYCHIATRIC: Normal mood and affect. Normal behavior. Cooperative  RADIOLOGY:  CTA PE Protocol 12/2020: Cardiovascular: No filling defects in the pulmonary arteries to suggest pulmonary emboli. Heart is normal size. Scattered coronary artery and aortic calcifications. No aneurysm.   Lungs/Pleura: Emphysema. Lungs are clear. No focal airspace opacities or suspicious nodules. No effusions.   Abdominal ultrasound: 08/05/2021: Infrarenal abdominal aortic aneurysm 3.7 cm.  Recommend follow-up ultrasound every 2 years. This recommendation follows ACR consensus guidelines: White Paper of the ACR Incidental Findings Committee II on Vascular Findings. J Am Coll Radiol 2013; 10:789-794.   CARDIAC DATABASE: EKG: 08/04/2021: Normal sinus rhythm, 84 bpm, LVH per voltage criteria, T wave inversions in lateral leads likely secondary to repolarization abnormality but lateral ischemia cannot be ruled out, without underlying injury pattern.  08/20/2021: Sinus bradycardia, 58 bpm, rare PVCs.    Echocardiogram: 08/05/2021:  1. Left ventricular ejection fraction, by estimation, is 40 to 45%. The left ventricle has mildly decreased function. The left ventricle demonstrates regional wall motion abnormalities (see scoring diagram/findings for description). The left ventricular   internal cavity size was dilated. There is mild left ventricular hypertrophy. Left ventricular diastolic parameters are consistent with Grade II diastolic dysfunction (pseudonormalization). Elevated left atrial pressure.   2. Right ventricular systolic function is normal. The right ventricular size is mildly enlarged. Mildly increased right ventricular wall thickness.   3. Left atrial size was moderately dilated.   4. Right atrial size was moderately dilated.   5. The mitral valve is grossly normal. Trivial mitral valve regurgitation. No evidence of mitral stenosis.   6. The aortic valve is tricuspid. Aortic valve regurgitation is not visualized. Aortic valve sclerosis is present, with no evidence of aortic valve stenosis.   7. The inferior vena cava is dilated in size with <50% respiratory variability, suggesting right atrial pressure of 15 mmHg.    Stress test: None   Heart catheterization: Right and left heart catheterization 08/05/2021: RA 7/6, mean 4 mmHg RV 30/4, EDP 13 mmHg PA 32/19, mean 23 mmHg, PA saturation 74%. PW 17/17, mean 14 mmHg.  Aortic saturation 96%. QP/QS 1.00. CO 7.89, CI 3.37 by Fick. LV 126/69, EDP 16 mmHg.  Ao 130/84, mean 106 male, acute.  There was no pressure gradient across aortic valve.  Mildly elevated EDP. LV: Appears mildly dilated.  LVEF appears to be at lower limit of normal around 55%. RCA: Nondominant.  However gives a large RV branch is sent acute marginal branch, the proximal RCA has 80% stenosis followed by distal RCA about 90% long stenosis. LM: Large vessel, no disease. LAD: Large vessel, gives origin to small diagonals.  No significant disease. RI: Large vessel, no  significant disease. Lateral RI: Large vessel, no significant disease. CX: Large and dominant.  Gives origin to small obtuse marginals.  Distal circumflex continues as PDA and PL branch.  The distal PDA branch is occluded and appears ulcerated.   Abdominal aortogram attempted: Full visualization.  There appears to be a suprarenal abdominal attic aneurysm.  Renal arteries could not be well visualized.  Aortoiliac bifurcation is widely patent.  Aorta has mild calcific atherosclerotic changes.  Aorta is mildly tortuous.   Impression:  Suspect EKG abnormalities related to distal left PDA occlusion.   Right coronary artery stenosis is inconsequential and essentially supplies the right ventricle and should not cause any significant issues.  We will start him on Plavix in view of ACS with what appears to be an ulcerated occlusion of the left PDA.   Right heart catheterization reveals no significant pulmonary hypertension,   Qp/Qs.   Continue aggressive risk modification, will order abdominal aortic duplex to exclude aneurysm.  110 mL contrast utilized.  LABORATORY DATA:    Latest Ref Rng & Units 08/06/2021   10:13 AM 08/05/2021    8:14 AM 08/05/2021    8:10 AM  CBC  WBC 4.0 - 10.5 K/uL 9.8  Hemoglobin 13.0 - 17.0 g/dL 16.1   09.6   04.5    Hematocrit 39.0 - 52.0 % 45.8   40.0   40.0    Platelets 150 - 400 K/uL 305           Latest Ref Rng & Units 08/06/2021   10:13 AM 08/05/2021    8:14 AM 08/05/2021    8:10 AM  CMP  Glucose 70 - 99 mg/dL 409      BUN 6 - 20 mg/dL 24      Creatinine 8.11 - 1.24 mg/dL 9.14      Sodium 782 - 145 mmol/L 138   140   141    Potassium 3.5 - 5.1 mmol/L 4.5   3.3   3.2    Chloride 98 - 111 mmol/L 106      CO2 22 - 32 mmol/L 22      Calcium 8.9 - 10.3 mg/dL 8.7        Lipid Panel     Component Value Date/Time   CHOL 133 08/05/2021 0447   TRIG 22 08/05/2021 0447   HDL 43 08/05/2021 0447   CHOLHDL 3.1 08/05/2021 0447   VLDL 4 08/05/2021 0447    LDLCALC 86 08/05/2021 0447   LDLDIRECT 93.6 08/04/2021 1900    No components found for: NTPROBNP No results for input(s): PROBNP in the last 8760 hours. Recent Labs    08/04/21 1856  TSH 0.644    BMP Recent Labs    08/04/21 1135 08/05/21 0447 08/05/21 0807 08/05/21 0810 08/05/21 0814 08/06/21 1013  NA 139 138   < > 141 140 138  K 3.9 3.6   < > 3.2* 3.3* 4.5  CL 111 105  --   --   --  106  CO2 23 26  --   --   --  22  GLUCOSE 122* 147*  --   --   --  111*  BUN 15 18  --   --   --  24*  CREATININE 1.03 1.18  --   --   --  1.14  CALCIUM 8.6* 8.8*  --   --   --  8.7*  GFRNONAA >60 >60  --   --   --  >60   < > = values in this interval not displayed.    HEMOGLOBIN A1C Lab Results  Component Value Date   HGBA1C 5.6 08/04/2021   MPG 114.02 08/04/2021    IMPRESSION:    ICD-10-CM   1. Chronic combined systolic and diastolic heart failure (HCC)  N56.21 Basic metabolic panel    Magnesium    Pro b natriuretic peptide (BNP)    dapagliflozin propanediol (FARXIGA) 10 MG TABS tablet    2. Non-ST elevation (NSTEMI) myocardial infarction (HCC)  I21.4 EKG 12-Lead    3. Benign hypertension  I10     4. Atherosclerosis of aorta (HCC)  I70.0     5. Coronary artery calcification seen on computed tomography  I25.10     6. Infrarenal abdominal aortic aneurysm (AAA) without rupture (HCC)  I71.43     7. Former smoker  Z87.891        RECOMMENDATIONS: MITSURU DAULT is a 61 y.o. Caucasian male whose past medical history and cardiac risk factors include: NSTEMI (May 2023), hypertension, former smoker (started at the age of 66 averaging 2 to 3 packs/day until the age of 82), excessive alcohol consumption, marijuana use, coronary and aortic atherosclerosis (nongated CT study 12/2020),  emphysema, infrarenal aortic aneurysm.  Non-ST elevation (NSTEMI) myocardial infarction [facility]) Continue dual antiplatelet therapy. Continue high intensity statin therapy. Noted to have CAD in the RCA/LCx  distribution as noted above. Congratulated on the efforts of complete smoking cessation over the last 3 weeks. Educated the importance of complete alcohol cessation. Reviewed the results of the echo and left heart catheterization with the patient and his wife at today's visit independently and noted above for further reference.  Chronic combined systolic and diastolic heart failure [facility]) Discovered May 2023. Stage C, NYHA class II/III. Medications reconciled. Check BMP, magnesium and NT proBNP. As long as renal function and electrolytes are within acceptable range would like to start Farxiga 10 mg p.o. daily. Samples for Marcelline Deist has been provided to the patient as well as a coupon card for both [place]. Educated the importance of medication compliance, daily weights, low-salt diet  Benign hypertension Office blood pressures are very well controlled on current medical regimen Patient is actually surprised to see blood pressure readings in the range. Reemphasized the importance of a low-salt diet.  Atherosclerosis of aorta (HCC) / Coronary artery calcification seen on computed tomography Continue aspirin and statin therapy. Educated the importance of secondary prevention.  Infrarenal abdominal aortic aneurysm (AAA) without rupture (HCC) Discovered in May 2023. Per report 3.7 cm infrarenal abdominal aortic aneurysm.   Recommend ultrasound every 2 years. Due to the limitations of the EMR I cannot schedule an ultrasound 2 years in advance - will need to be ordered closer to May 2025. Educated on the importance of complete smoking cessation.  FINAL MEDICATION LIST END OF ENCOUNTER: Meds ordered this encounter  Medications  . dapagliflozin propanediol (FARXIGA) 10 MG TABS tablet    Sig: Take 1 tablet (10 mg total) by mouth daily before breakfast.    Dispense:  90 tablet    Refill:  0    There are no discontinued medications.   Current Outpatient Medications:  .   albuterol (VENTOLIN HFA) 108 (90 Base) MCG/ACT inhaler, Inhale 1-2 puffs into the lungs every 4 (four) hours as needed for wheezing or shortness of breath., Disp: 8.5 g, Rfl: 2 .  aspirin 81 MG EC tablet, Take 1 tablet (81 mg total) by mouth daily., Disp: 30 tablet, Rfl: 5 .  atorvastatin (LIPITOR) 80 MG tablet, Take 1 tablet (80 mg total) by mouth at bedtime., Disp: 30 tablet, Rfl: 2 .  carvedilol (COREG) 25 MG tablet, Take 1 tablet (25 mg total) by mouth 2 (two) times daily with a meal., Disp: 60 tablet, Rfl: 2 .  clopidogrel (PLAVIX) 75 MG tablet, Take 1 tablet (75 mg total) by mouth daily., Disp: 30 tablet, Rfl: 2 .  dapagliflozin propanediol (FARXIGA) 10 MG TABS tablet, Take 1 tablet (10 mg total) by mouth daily before breakfast., Disp: 90 tablet, Rfl: 0 .  fluticasone (FLONASE) 50 MCG/ACT nasal spray, Place 2 sprays into both nostrils daily., Disp: 16 g, Rfl: 2 .  furosemide (LASIX) 40 MG tablet, Take 1 tablet (40 mg total) by mouth daily., Disp: 30 tablet, Rfl: 2 .  sacubitril-valsartan (ENTRESTO) 49-51 MG, Take 1 tablet by mouth 2 (two) times daily., Disp: 60 tablet, Rfl: 2 .  spironolactone (ALDACTONE) 25 MG tablet, Take 0.5 tablets (12.5 mg total) by mouth daily. (Patient taking differently: Take 25 mg by mouth daily.), Disp: 30 tablet, Rfl: 2 .  umeclidinium bromide (INCRUSE ELLIPTA) 62.5 MCG/ACT AEPB, Inhale 1 puff into the lungs daily., Disp: 30 each, Rfl: 2  Orders Placed This Encounter  Procedures  . Basic metabolic panel  . Magnesium  . Pro b natriuretic peptide (BNP)  . EKG 12-Lead    There are no Patient Instructions on file for this visit.   --Continue cardiac medications as reconciled in final medication list. --Return in about 4 weeks (around 09/17/2021) for Follow up, CAD, heart failure management.. or sooner if needed. --Continue follow-up with your primary care physician regarding the management of your other chronic comorbid conditions.  Patient's questions and  concerns were addressed to his satisfaction. He voices understanding of the instructions provided during this encounter.   This note was created using a voice recognition software as a result there may be grammatical errors inadvertently enclosed that do not reflect the nature of this encounter. Every attempt is made to correct such errors.  Tessa LernerSunit Council Munguia, [place]DO, Northwest Health Physicians' Specialty HospitalFACC  Pager: [phone] Office: [phone]

## 2021-08-25 ENCOUNTER — Ambulatory Visit (HOSPITAL_COMMUNITY)
Admission: RE | Admit: 2021-08-25 | Discharge: 2021-08-25 | Disposition: A | Discharge status: Home / Self Care | Payer: 59 | Source: Ambulatory Visit | Attending: Internal Medicine | Admitting: Internal Medicine

## 2021-08-25 DIAGNOSIS — Z720 Tobacco use: Secondary | ICD-10-CM | POA: Insufficient documentation

## 2021-08-25 DIAGNOSIS — R0602 Shortness of breath: Secondary | ICD-10-CM | POA: Diagnosis present

## 2021-08-25 LAB — PULMONARY FUNCTION TEST
DL/VA % pred: 68 %
DL/VA: 2.85 ml/min/mmHg/L
DLCO cor % pred: 71 %
DLCO cor: 22.2 ml/min/mmHg
DLCO unc % pred: 71 %
DLCO unc: 22.2 ml/min/mmHg
FEF 25-75 Post: 2.19 L/sec
FEF 25-75 Pre: 2.48 L/sec
FEF2575-%Change-Post: -11 %
FEF2575-%Pred-Post: 66 %
FEF2575-%Pred-Pre: 74 %
FEV1-%Change-Post: 0 %
FEV1-%Pred-Post: 88 %
FEV1-%Pred-Pre: 88 %
FEV1-Post: 3.64 L
FEV1-Pre: 3.65 L
FEV1FVC-%Change-Post: -1 %
FEV1FVC-%Pred-Pre: 88 %
FEV6-%Change-Post: 0 %
FEV6-%Pred-Post: 100 %
FEV6-%Pred-Pre: 101 %
FEV6-Post: 5.23 L
FEV6-Pre: 5.27 L
FEV6FVC-%Change-Post: -1 %
FEV6FVC-%Pred-Post: 99 %
FEV6FVC-%Pred-Pre: 101 %
FVC-%Change-Post: 1 %
FVC-%Pred-Post: 101 %
FVC-%Pred-Pre: 100 %
FVC-Post: 5.5 L
FVC-Pre: 5.44 L
Post FEV1/FVC ratio: 66 %
Post FEV6/FVC ratio: 95 %
Pre FEV1/FVC ratio: 67 %
Pre FEV6/FVC Ratio: 97 %
RV % pred: 141 %
RV: 3.53 L
TLC % pred: 116 %
TLC: 9.09 L

## 2021-08-25 LAB — BASIC METABOLIC PANEL
BUN/Creatinine Ratio: 19 (ref 10–24)
BUN: 19 mg/dL (ref 8–27)
CO2: 23 mmol/L (ref 20–29)
Calcium: 9.2 mg/dL (ref 8.6–10.2)
Chloride: 100 mmol/L (ref 96–106)
Creatinine, Ser: 0.98 mg/dL (ref 0.76–1.27)
Glucose: 134 mg/dL — ABNORMAL HIGH (ref 70–99)
Potassium: 4.2 mmol/L (ref 3.5–5.2)
Sodium: 138 mmol/L (ref 134–144)
eGFR: 88 mL/min/{1.73_m2} (ref >59)

## 2021-08-25 LAB — PRO B NATRIURETIC PEPTIDE: NT-Pro BNP: 5769 pg/mL — ABNORMAL HIGH (ref 0–210)

## 2021-08-25 LAB — MAGNESIUM: Magnesium: 2 mg/dL (ref 1.6–2.3)

## 2021-08-25 MED ORDER — ALBUTEROL SULFATE (2.5 MG/3ML) 0.083% IN NEBU
2.5000 mg | INHALATION_SOLUTION | Freq: Once | RESPIRATORY_TRACT | Status: AC
  Administered 2021-08-25: 2.5 mg via RESPIRATORY_TRACT

## 2021-08-27 ENCOUNTER — Other Ambulatory Visit: Payer: Self-pay | Admitting: Cardiology

## 2021-08-27 ENCOUNTER — Other Ambulatory Visit: Payer: Self-pay

## 2021-08-27 ENCOUNTER — Other Ambulatory Visit (HOSPITAL_COMMUNITY): Payer: Self-pay

## 2021-08-27 DIAGNOSIS — I5042 Chronic combined systolic (congestive) and diastolic (congestive) heart failure: Secondary | ICD-10-CM

## 2021-08-27 MED ORDER — DAPAGLIFLOZIN PROPANEDIOL 10 MG PO TABS
10.0000 mg | ORAL_TABLET | Freq: Every day | ORAL | 0 refills | Status: DC
  Filled 2021-08-27: qty 90, 90d supply, fill #0

## 2021-08-27 NOTE — Progress Notes (Signed)
Called and spoke to patient he voiced understanding I sent a refill on farxiga

## 2021-08-27 NOTE — Addendum Note (Signed)
Addended by: Durward Mallard on: 08/27/2021 09:55 AM   Modules accepted: Orders

## 2021-08-30 ENCOUNTER — Other Ambulatory Visit (HOSPITAL_COMMUNITY): Payer: Self-pay

## 2021-08-30 ENCOUNTER — Other Ambulatory Visit: Payer: Self-pay | Admitting: Internal Medicine

## 2021-08-31 ENCOUNTER — Other Ambulatory Visit (HOSPITAL_COMMUNITY): Payer: Self-pay

## 2021-09-02 ENCOUNTER — Other Ambulatory Visit (HOSPITAL_COMMUNITY): Payer: Self-pay

## 2021-09-03 ENCOUNTER — Other Ambulatory Visit (HOSPITAL_COMMUNITY): Payer: Self-pay

## 2021-09-03 ENCOUNTER — Telehealth: Payer: Self-pay

## 2021-09-03 MED ORDER — SPIRONOLACTONE 25 MG PO TABS: 25.0000 mg | ORAL_TABLET | Freq: Every day | ORAL | 2 refills | Status: DC

## 2021-09-03 MED ORDER — SPIRONOLACTONE 25 MG PO TABS
25.0000 mg | ORAL_TABLET | Freq: Every day | ORAL | 2 refills | Status: DC
  Filled 2021-09-03 (×2): qty 30, 30d supply, fill #0

## 2021-09-03 NOTE — Telephone Encounter (Signed)
Aldactone 25mg  po qday.   ST

## 2021-09-03 NOTE — Telephone Encounter (Signed)
Called and spoke to pts pharmacy regarding aldactone and updated pts MAR.

## 2021-09-03 NOTE — Telephone Encounter (Signed)
Pts pharmacy called and stated that the pt is out of spironolactone. They are unsure if the patient should be taking half a tablet or a whole tablet. They would also like for [place] to take over refilling it. Please advise.

## 2021-09-07 ENCOUNTER — Ambulatory Visit: Payer: 59 | Attending: Internal Medicine | Admitting: Internal Medicine

## 2021-09-13 ENCOUNTER — Other Ambulatory Visit (HOSPITAL_COMMUNITY): Payer: Self-pay

## 2021-09-17 ENCOUNTER — Ambulatory Visit: Payer: 59 | Admitting: Cardiology

## 2021-09-23 ENCOUNTER — Ambulatory Visit: Payer: 59 | Admitting: Cardiology

## 2021-09-23 ENCOUNTER — Other Ambulatory Visit (HOSPITAL_COMMUNITY): Payer: Self-pay

## 2021-09-23 ENCOUNTER — Encounter: Payer: Self-pay | Admitting: Cardiology

## 2021-09-23 ENCOUNTER — Telehealth: Payer: Self-pay | Admitting: Cardiology

## 2021-09-23 VITALS — BP 150/94 | HR 57 | Temp 97.7°F | Resp 16 | Ht 75.0 in | Wt 243.0 lb

## 2021-09-23 DIAGNOSIS — Z87891 Personal history of nicotine dependence: Secondary | ICD-10-CM

## 2021-09-23 DIAGNOSIS — I10 Essential (primary) hypertension: Secondary | ICD-10-CM

## 2021-09-23 DIAGNOSIS — I2510 Atherosclerotic heart disease of native coronary artery without angina pectoris: Secondary | ICD-10-CM

## 2021-09-23 DIAGNOSIS — I7143 Infrarenal abdominal aortic aneurysm, without rupture: Secondary | ICD-10-CM

## 2021-09-23 DIAGNOSIS — I214 Non-ST elevation (NSTEMI) myocardial infarction: Secondary | ICD-10-CM

## 2021-09-23 DIAGNOSIS — I25119 Atherosclerotic heart disease of native coronary artery with unspecified angina pectoris: Secondary | ICD-10-CM

## 2021-09-23 DIAGNOSIS — I5042 Chronic combined systolic (congestive) and diastolic (congestive) heart failure: Secondary | ICD-10-CM

## 2021-09-23 DIAGNOSIS — I700 Atherosclerosis of aorta: Secondary | ICD-10-CM

## 2021-09-23 MED ORDER — DAPAGLIFLOZIN PROPANEDIOL 10 MG PO TABS
10.0000 mg | ORAL_TABLET | Freq: Every day | ORAL | 0 refills | Status: DC
  Filled 2021-09-23: qty 90, 90d supply, fill #0

## 2021-09-23 MED ORDER — RANOLAZINE ER 500 MG PO TB12
500.0000 mg | ORAL_TABLET | Freq: Two times a day (BID) | ORAL | 0 refills | Status: DC
  Filled 2021-09-23: qty 180, 90d supply, fill #0

## 2021-09-23 NOTE — Telephone Encounter (Signed)
Patient arrived for appointment this morning. Upon checking in, I verified patient's insurance and informed him he has a $10 co-pay. Patient said "Forget it then, cancel it" and proceeded to walk out as I told him we could have it billed instead so he could be seen today. Patient continued to leave. I canceled his appointment for today. Please advise.

## 2021-09-23 NOTE — Progress Notes (Signed)
ID:  Michael JumboJohn F Ledgerwood, DOB 31-Dec-1960, MRN [id number]  PCP:  Ilene Quaemaio, Alexa, MD  Cardiologist:  Tessa LernerSunit Ottie Neglia, DO, [facility] (established care 08/04/2021)  Date: 09/23/21 Last Office Visit: 08/20/2021  Chief Complaint  Patient presents with   Coronary Artery Disease   heart failure management   Follow-up    HPI  Michael Bell is a 61 y.o. Caucasian male whose past medical history and cardiovascular risk factors include: NSTEMI (May 2023), hypertension, former smoker (started at the age of 61 averaging 2 to 3 packs/day until the age of 61), excessive alcohol consumption, marijuana use, coronary and aortic atherosclerosis (nongated CT study 12/2020), emphysema, infrarenal aortic aneurysm.  Presented to the hospital in May 2023 ruled in for NSTEMI, underwent left and right heart catheterization findings noted below for further reference.  Patient was advised to improve his modifiable cardiovascular risk factors and up titration of antihypertensive and antianginal therapy.  No coronary interventions were performed.  Echocardiogram notes mildly reduced LVEF with grade 2 diastolic dysfunction and he was started on GDMT.  Prior to establishing care with him my self patient's SBP would range between 190-200 mmHg.  His home blood pressures are improving but slightly trending upward since last visit.  Patient forgot to pick up both Farxiga and spironolactone from his pharmacy.  He has been under a lot of stress as his father is sick and his wife recently left him.  At times he complains of atypical cardiac discomfort but denies heart failure symptoms.  He has successfully stopped smoking and drinking alcohol.  But due to stress he is rethinking about starting them back up.  He is strongly advised not to do so given his success.  FUNCTIONAL STATUS: No structured exercise program or daily routine.   ALLERGIES: No Known Allergies  MEDICATION LIST PRIOR TO VISIT: Current Meds  Medication Sig   albuterol  (VENTOLIN HFA) 108 (90 Base) MCG/ACT inhaler Inhale 1-2 puffs into the lungs every 4 (four) hours as needed for wheezing or shortness of breath.   aspirin EC 81 MG tablet Take 1 tablet (81 mg total) by mouth daily.   atorvastatin (LIPITOR) 80 MG tablet Take 1 tablet (80 mg total) by mouth at bedtime.   carvedilol (COREG) 25 MG tablet Take 1 tablet (25 mg total) by mouth 2 (two) times daily with a meal.   clopidogrel (PLAVIX) 75 MG tablet Take 1 tablet (75 mg total) by mouth daily.   fluticasone (FLONASE) 50 MCG/ACT nasal spray Place 2 sprays into both nostrils daily.   furosemide (LASIX) 40 MG tablet Take 1 tablet (40 mg total) by mouth daily.   ranolazine (RANEXA) 500 MG 12 hr tablet Take 1 tablet by mouth 2 times daily.   sacubitril-valsartan (ENTRESTO) 49-51 MG Take 1 tablet by mouth 2 (two) times daily.   umeclidinium bromide (INCRUSE ELLIPTA) 62.5 MCG/ACT AEPB Inhale 1 puff into the lungs daily.     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;  Surgeon: Kerrin ChampagneJames E Nitka, MD;  Location: [facility] OR;  Service: Orthopedics;  Laterality: N/A;   NOSE SURGERY     40 yrs ago   RIGHT/LEFT HEART CATH AND CORONARY ANGIOGRAPHY N/A  08/05/2021   Procedure: RIGHT/LEFT HEART CATH AND CORONARY ANGIOGRAPHY;  Surgeon: Yates Decamp, MD;  Location: [facility] INVASIVE CV LAB;  Service: Cardiovascular;  Laterality: N/A;    FAMILY HISTORY: The patient family history includes COPD in his maternal grandmother; Cancer in his mother, paternal uncle, and sister; Colon cancer (age of onset: 60) in his sister; Colon polyps in his father; Diabetes in his mother; Down syndrome in his paternal aunt; Heart disease in his brother,  brother, father, and mother; Hyperlipidemia in his brother, father, and mother; Hypertension in his brother, brother, father, and mother; Mental illness in his mother; Ovarian cancer (age of onset: 100) in his mother; Stomach cancer in his paternal grandmother; Stroke in his father and mother.  SOCIAL HISTORY:  The patient  reports that he quit smoking about 5 months ago. His smoking use included cigarettes. He has never used smokeless tobacco. He reports that he does not currently use alcohol. He reports that he does not currently use drugs after having used the following drugs: Marijuana.  REVIEW OF SYSTEMS: Review of Systems  Constitutional: Negative for chills and fever.  HENT:  Negative for hoarse voice and nosebleeds.   Eyes:  Negative for discharge, double vision and pain.  Cardiovascular:  Positive for dyspnea on exertion (better). Negative for chest pain, claudication, leg swelling, near-syncope, orthopnea, palpitations, paroxysmal nocturnal dyspnea and syncope.  Respiratory:  Positive for shortness of breath. Negative for cough, hemoptysis and sputum production.   Musculoskeletal:  Negative for muscle cramps and myalgias.  Gastrointestinal:  Negative for abdominal pain, constipation, diarrhea, hematemesis, hematochezia, melena, nausea and vomiting.  Neurological:  Negative for dizziness and light-headedness.    PHYSICAL EXAM:    09/23/2021   11:05 AM 08/20/2021    2:06 PM 08/13/2021    9:36 AM  Vitals with BMI  Height 6\' 3"  6\' 3"  6\' 3"   Weight 243 lbs 236 lbs 10 oz 243 lbs  BMI 30.37 29.57 30.37  Systolic 150 120  Diastolic 94 79 87  Pulse 57 62 54    CONSTITUTIONAL: Appears older than stated age, fatigued, hemodynamically stable, no acute distress.  SKIN: Skin is warm and dry. No rash noted. No cyanosis. No pallor. No jaundice.  Tattoos present. HEAD: Normocephalic and atraumatic.  EYES: No scleral icterus MOUTH/THROAT: Moist oral membranes.  NECK: No JVD present. No  thyromegaly noted. No carotid bruits  CHEST Normal respiratory effort. No intercostal retractions  LUNGS: Clear to auscultation bilaterally.  No stridor. No wheezes. No rales.  CARDIOVASCULAR: Regular, positive S1-S2, subtle systolic murmur heard at the apex, no gallops or rubs.   ABDOMINAL: Obese, soft, nontender, nondistended, positive bowel sounds in all 4 quadrants, no apparent ascites.  EXTREMITIES: No peripheral edema, warm to touch, 2+ bilateral DP and PT pulses HEMATOLOGIC: No significant bruising NEUROLOGIC: Oriented to person, place, and time. Nonfocal. Normal muscle tone.  PSYCHIATRIC: Normal mood and affect. Normal behavior. Cooperative  RADIOLOGY:  CTA PE Protocol 12/2020: Cardiovascular: No filling defects in the pulmonary arteries to suggest pulmonary emboli. Heart is normal size. Scattered coronary artery and aortic calcifications. No aneurysm.   Lungs/Pleura: Emphysema. Lungs are clear. No focal airspace opacities or suspicious nodules. No effusions.   Abdominal ultrasound: 08/05/2021: Infrarenal abdominal aortic aneurysm 3.7 cm. Recommend follow-up ultrasound every 2 years. This recommendation follows ACR consensus guidelines: White Paper of the ACR Incidental Findings Committee II on Vascular Findings. J Am Coll Radiol 2013; 10:789-794.   CARDIAC DATABASE: EKG: 08/04/2021: Normal sinus rhythm, 84 bpm,  LVH per voltage criteria, T wave inversions in lateral leads likely secondary to repolarization abnormality but lateral ischemia cannot be ruled out, without underlying injury pattern.  08/20/2021: Sinus bradycardia, 58 bpm, rare PVCs.   Echocardiogram: 08/05/2021:  1. Left ventricular ejection fraction, by estimation, is 40 to 45%. The left ventricle has mildly decreased function. The left ventricle demonstrates regional wall motion abnormalities (see scoring diagram/findings for description). The left ventricular   internal cavity size was dilated. There is mild left  ventricular hypertrophy. Left ventricular diastolic parameters are consistent with Grade II diastolic dysfunction (pseudonormalization). Elevated left atrial pressure.   2. Right ventricular systolic function is normal. The right ventricular size is mildly enlarged. Mildly increased right ventricular wall thickness.   3. Left atrial size was moderately dilated.   4. Right atrial size was moderately dilated.   5. The mitral valve is grossly normal. Trivial mitral valve regurgitation. No evidence of mitral stenosis.   6. The aortic valve is tricuspid. Aortic valve regurgitation is not visualized. Aortic valve sclerosis is present, with no evidence of aortic valve stenosis.   7. The inferior vena cava is dilated in size with <50% respiratory variability, suggesting right atrial pressure of 15 mmHg.    Stress test: None   Heart catheterization: Right and left heart catheterization 08/05/2021: RA 7/6, mean 4 mmHg RV 30/4, EDP 13 mmHg PA 32/19, mean 23 mmHg, PA saturation 74%. PW 17/17, mean 14 mmHg.  Aortic saturation 96%. QP/QS 1.00. CO 7.89, CI 3.37 by Fick. LV 126/69, EDP 16 mmHg.  Ao 130/84, mean 106 male, acute.  There was no pressure gradient across aortic valve.  Mildly elevated EDP. LV: Appears mildly dilated.  LVEF appears to be at lower limit of normal around 55%. RCA: Nondominant.  However gives a large RV branch is sent acute marginal branch, the proximal RCA has 80% stenosis followed by distal RCA about 90% long stenosis. LM: Large vessel, no disease. LAD: Large vessel, gives origin to small diagonals.  No significant disease. RI: Large vessel, no significant disease. Lateral RI: Large vessel, no significant disease. CX: Large and dominant.  Gives origin to small obtuse marginals.  Distal circumflex continues as PDA and PL branch.  The distal PDA branch is occluded and appears ulcerated.   Abdominal aortogram attempted: Full visualization.  There appears to be a suprarenal  abdominal attic aneurysm.  Renal arteries could not be well visualized.  Aortoiliac bifurcation is widely patent.  Aorta has mild calcific atherosclerotic changes.  Aorta is mildly tortuous.   Impression:  Suspect EKG abnormalities related to distal left PDA occlusion.   Right coronary artery stenosis is inconsequential and essentially supplies the right ventricle and should not cause any significant issues.  We will start him on Plavix in view of ACS with what appears to be an ulcerated occlusion of the left PDA.   Right heart catheterization reveals no significant pulmonary hypertension,   Qp/Qs.   Continue aggressive risk modification, will order abdominal aortic duplex to exclude aneurysm.  110 mL contrast utilized.  LABORATORY DATA:    Latest Ref Rng & Units 08/06/2021   10:13 AM 08/05/2021    8:14 AM 08/05/2021    8:10 AM  CBC  WBC 4.0 - 10.5 K/uL 9.8     Hemoglobin 13.0 - 17.0 g/dL 96.2  22.9  79.8   Hematocrit 39.0 - 52.0 % 45.8  40.0  40.0   Platelets 150 - 400 K/uL 305  Latest Ref Rng & Units 08/24/2021    8:09 AM 08/06/2021   10:13 AM 08/05/2021    8:14 AM  CMP  Glucose 70 - 99 mg/dL 644  034    BUN 8 - 27 mg/dL 19  24    Creatinine 7.42 - 1.27 mg/dL 5.95  6.38    Sodium 756 - 144 mmol/L 138  138  140   Potassium 3.5 - 5.2 mmol/L 4.2  4.5  3.3   Chloride 96 - 106 mmol/L 100  106    CO2 20 - 29 mmol/L 23  22    Calcium 8.6 - 10.2 mg/dL 9.2  8.7      Lipid Panel     Component Value Date/Time   CHOL 133 08/05/2021 0447   TRIG 22 08/05/2021 0447   HDL 43 08/05/2021 0447   CHOLHDL 3.1 08/05/2021 0447   VLDL 4 08/05/2021 0447   LDLCALC 86 08/05/2021 0447   LDLDIRECT 93.6 08/04/2021 1900    No components found for: "NTPROBNP" Recent Labs    08/24/21 0809  PROBNP 5,769*   Recent Labs    08/04/21 1856  TSH 0.644    BMP Recent Labs    08/04/21 1135 08/05/21 0447 08/05/21 0807 08/05/21 0814 08/06/21 1013 08/24/21 0809  NA 139 138   < > 140  138 138  K 3.9 3.6   < > 3.3* 4.5 4.2  CL 111 105  --   --  106 100  CO2 23 26  --   --  22 23  GLUCOSE 122* 147*  --   --  111* 134*  BUN 15 18  --   --  24* 19  CREATININE 1.03 1.18  --   --  1.14 0.98  CALCIUM 8.6* 8.8*  --   --  8.7* 9.2  GFRNONAA >60 >60  --   --  >60  --    < > = values in this interval not displayed.    HEMOGLOBIN A1C Lab Results  Component Value Date   HGBA1C 5.6 08/04/2021   MPG 114.02 08/04/2021    IMPRESSION:    ICD-10-CM   1. Chronic combined systolic and diastolic heart failure (HCC)  E33.29 dapagliflozin propanediol (FARXIGA) 10 MG TABS tablet    Basic metabolic panel    Magnesium    Pro b natriuretic peptide (BNP)    Ambulatory referral to Sleep Studies    2. Atherosclerosis of native coronary artery of native heart with angina pectoris (HCC)  I25.119 ranolazine (RANEXA) 500 MG 12 hr tablet    Ambulatory referral to Sleep Studies    3. Non-ST elevation (NSTEMI) myocardial infarction [facility])  I21.4 Ambulatory referral to Sleep Studies    4. Benign hypertension  I10 Ambulatory referral to Sleep Studies    5. Atherosclerosis of aorta (HCC)  I70.0     6. Coronary artery calcification seen on computed tomography  I25.10     7. Infrarenal abdominal aortic aneurysm (AAA) without rupture (HCC)  I71.43     8. Former smoker  Z87.891        RECOMMENDATIONS: JAYON MATTON is a 61 y.o. Caucasian male whose past medical history and cardiac risk factors include: NSTEMI (May 2023), hypertension, former smoker (started at the age of 48 averaging 2 to 3 packs/day until the age of 20), excessive alcohol consumption, marijuana use, coronary and aortic atherosclerosis (nongated CT study 12/2020), emphysema, infrarenal aortic aneurysm.  Chronic combined systolic and diastolic heart failure (HCC) Discovered  May 2023. Stage C, NYHA class II/III. Medications reconciled. Refill Farxiga 10 mg p.o. every morning.  Labs in 1 week to evaluate kidney function and  electrolytes. If patient has room with blood pressure would recommend up titration of Entresto or starting spironolactone. Patient is to call us back in 2 weeks with his blood pressure readings.  Atherosclerosis of native coronary artery of native heart with other forms of angina pectoris (HCC), Hx of NSTEMI Patient has precordial pain not entirely suggestive of angina pectoris. We reviewed his antianginal therapy. We will add Ranexa 500 mg p.o. 3 times daily.  Reviewed the recent heart catheterization results. Compliant with dual antiplatelet therapy. Continue high intensity statin therapy. Congratulated on his efforts with regards to complete smoking and alcohol cessation.  Benign hypertension Office blood pressures are elevated. Likely secondary to stress of his father being sick and wife recently left him.  Patient states that he has been compliant with medical therapy. Medication changes as discussed above. He is asked to keep a log of his blood pressures and to call if his SBP is consistently greater than 140 mmHg.  Atherosclerosis of aorta (HCC) / Coronary artery calcification seen on computed tomography Continue aspirin and statin therapy. Reemphasized importance of secondary prevention.  Infrarenal abdominal aortic aneurysm (AAA) without rupture (HCC) Discovered in May 2023. Per report 3.7 cm infrarenal abdominal aortic aneurysm-recommending follow-up in 2 years. Follow-up abdominal duplex needs to be ordered closer to May 2025. Congratulated on the importance of complete nicotine and alcohol cessation.  Patient complains of having a very poor sleep hygiene, fragmented sleep, feels tired and fatigued and exhausted.  I suspect that he has a degree of sleep apnea which needs to be evaluated versus other sleep-related disorders.  I will place a referral for Dr. Allie Dimmer for further evaluation and management.  FINAL MEDICATION LIST END OF ENCOUNTER: Meds ordered this encounter   Medications   dapagliflozin propanediol (FARXIGA) 10 MG TABS tablet    Sig: Take 1 tablet by mouth daily before breakfast.    Dispense:  90 tablet    Refill:  0   ranolazine (RANEXA) 500 MG 12 hr tablet    Sig: Take 1 tablet by mouth 2 times daily.    Dispense:  180 tablet    Refill:  0    Medications Discontinued During This Encounter  Medication Reason   spironolactone (ALDACTONE) 25 MG tablet    dapagliflozin propanediol (FARXIGA) 10 MG TABS tablet Reorder   spironolactone (ALDACTONE) 25 MG tablet      Current Outpatient Medications:    albuterol (VENTOLIN HFA) 108 (90 Base) MCG/ACT inhaler, Inhale 1-2 puffs into the lungs every 4 (four) hours as needed for wheezing or shortness of breath., Disp: 8.5 g, Rfl: 2   aspirin EC 81 MG tablet, Take 1 tablet (81 mg total) by mouth daily., Disp: 30 tablet, Rfl: 5   atorvastatin (LIPITOR) 80 MG tablet, Take 1 tablet (80 mg total) by mouth at bedtime., Disp: 30 tablet, Rfl: 2   carvedilol (COREG) 25 MG tablet, Take 1 tablet (25 mg total) by mouth 2 (two) times daily with a meal., Disp: 60 tablet, Rfl: 2   clopidogrel (PLAVIX) 75 MG tablet, Take 1 tablet (75 mg total) by mouth daily., Disp: 30 tablet, Rfl: 2   fluticasone (FLONASE) 50 MCG/ACT nasal spray, Place 2 sprays into both nostrils daily., Disp: 16 g, Rfl: 2   furosemide (LASIX) 40 MG tablet, Take 1 tablet (40 mg total) by mouth daily.,  Disp: 30 tablet, Rfl: 2   ranolazine (RANEXA) 500 MG 12 hr tablet, Take 1 tablet by mouth 2 times daily., Disp: 180 tablet, Rfl: 0   sacubitril-valsartan (ENTRESTO) 49-51 MG, Take 1 tablet by mouth 2 (two) times daily., Disp: 60 tablet, Rfl: 2   umeclidinium bromide (INCRUSE ELLIPTA) 62.5 MCG/ACT AEPB, Inhale 1 puff into the lungs daily., Disp: 30 each, Rfl: 2   dapagliflozin propanediol (FARXIGA) 10 MG TABS tablet, Take 1 tablet by mouth daily before breakfast., Disp: 90 tablet, Rfl: 0  Orders Placed This Encounter  Procedures   Basic metabolic panel    Magnesium   Pro b natriuretic peptide (BNP)   Ambulatory referral to Sleep Studies    There are no Patient Instructions on file for this visit.   --Continue cardiac medications as reconciled in final medication list. --Return in about 6 weeks (around 11/04/2021) for Follow up, heart failure management, CAD. or sooner if needed. --Continue follow-up with your primary care physician regarding the management of your other chronic comorbid conditions.  Patient's questions and concerns were addressed to his satisfaction. He voices understanding of the instructions provided during this encounter.   This note was created using a voice recognition software as a result there may be grammatical errors inadvertently enclosed that do not reflect the nature of this encounter. Every attempt is made to correct such errors.  Tessa Lerner, [place], [facility]  Pager: [phone] Office: [phone]

## 2021-09-23 NOTE — Telephone Encounter (Signed)
Please have him reschedule - given his hx of recent hospitalization for NSTEMI and heart failure.  Dr. Odis Hollingshead

## 2021-09-24 ENCOUNTER — Other Ambulatory Visit (HOSPITAL_COMMUNITY): Payer: Self-pay

## 2021-09-24 NOTE — Telephone Encounter (Signed)
Patient came in for a office visit yesterday 09/23/21

## 2021-10-05 ENCOUNTER — Ambulatory Visit (INDEPENDENT_AMBULATORY_CARE_PROVIDER_SITE_OTHER): Payer: 59

## 2021-10-05 ENCOUNTER — Other Ambulatory Visit (HOSPITAL_COMMUNITY): Payer: Self-pay

## 2021-10-05 ENCOUNTER — Ambulatory Visit (HOSPITAL_COMMUNITY): Payer: 59 | Attending: Internal Medicine

## 2021-10-05 ENCOUNTER — Other Ambulatory Visit: Payer: Self-pay

## 2021-10-05 VITALS — BP 161/109 | HR 80 | Temp 97.5°F | Ht 75.0 in | Wt 238.9 lb

## 2021-10-05 DIAGNOSIS — I5022 Chronic systolic (congestive) heart failure: Secondary | ICD-10-CM | POA: Diagnosis not present

## 2021-10-05 DIAGNOSIS — Z720 Tobacco use: Secondary | ICD-10-CM

## 2021-10-05 DIAGNOSIS — F17210 Nicotine dependence, cigarettes, uncomplicated: Secondary | ICD-10-CM

## 2021-10-05 DIAGNOSIS — I110 Hypertensive heart disease with heart failure: Secondary | ICD-10-CM | POA: Diagnosis not present

## 2021-10-05 DIAGNOSIS — J439 Emphysema, unspecified: Secondary | ICD-10-CM | POA: Diagnosis not present

## 2021-10-05 DIAGNOSIS — I10 Essential (primary) hypertension: Secondary | ICD-10-CM

## 2021-10-05 DIAGNOSIS — R079 Chest pain, unspecified: Secondary | ICD-10-CM | POA: Insufficient documentation

## 2021-10-05 DIAGNOSIS — J449 Chronic obstructive pulmonary disease, unspecified: Secondary | ICD-10-CM | POA: Insufficient documentation

## 2021-10-05 DIAGNOSIS — F419 Anxiety disorder, unspecified: Secondary | ICD-10-CM

## 2021-10-05 MED ORDER — CLONAZEPAM 0.5 MG PO TABS: 0.5000 mg | ORAL_TABLET | Freq: Two times a day (BID) | ORAL | 0 refills | Status: DC

## 2021-10-05 MED ORDER — ANORO ELLIPTA 62.5-25 MCG/ACT IN AEPB
1.0000 | INHALATION_SPRAY | Freq: Every day | RESPIRATORY_TRACT | 3 refills | Status: DC
  Filled 2021-10-05 – 2021-11-03 (×2): qty 60, 30d supply, fill #0

## 2021-10-05 MED ORDER — ENTRESTO 97-103 MG PO TABS
1.0000 | ORAL_TABLET | Freq: Two times a day (BID) | ORAL | 3 refills | Status: DC
  Filled 2021-10-05 – 2021-11-03 (×2): qty 60, 30d supply, fill #0
  Filled 2021-12-20: qty 60, 30d supply, fill #1
  Filled 2022-02-07: qty 60, 30d supply, fill #2

## 2021-10-05 MED ORDER — ESCITALOPRAM OXALATE 10 MG PO TABS
10.0000 mg | ORAL_TABLET | Freq: Every day | ORAL | 1 refills | Status: DC
  Filled 2021-10-05: qty 30, 30d supply, fill #0
  Filled 2021-11-03: qty 30, 30d supply, fill #1

## 2021-10-05 NOTE — Progress Notes (Deleted)
HFrEF CAD: Diagnosed May 2023 w EF 40-45%, also cathed at that time for NSTEMI. Grade 2 diastolic dysfunction. LVH voltage criteria. Following with cards. DAPT 1 year. Saw cards 09/23/21: repeat BMP in 1 week and uptitrate entresto or start spiro if blood pressure can handle it. A1C 08/04/21 5.6%. -Coreg 25 -Farxiga 10mg  -LAsix 40 mg -Ranolazine 500mg  BID -Entresto 49-51  High risk tobacco use: Age 61-60(45 yrs) x 2-3 packs/yr= atleast 90 pack years. Currently smoking or drinking? - low dose CT lung cancer screening? CTA PE study 12/2020 no focal nodules seen.  emphysema  infrarenal aortic aneurysm: 3.7 cm on abdominal ultrasound 08/05/21. F/u ultrasound q 2 years.  HLD: last lipid panel 133 total cholesterol, 22 tri, 43 HDL, 86 LDL on 08/05/21. -Atorvostatin 80  R/O sleep apnea: -cards referred to Dr. 10/05/21

## 2021-10-05 NOTE — Assessment & Plan Note (Signed)
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 Saturating 97% on room air today with audible wheezing but otherwise good air movement in lungs and no crackles with normal respiratory effort. He is using his rescue albuterol inhaler 2-3 times daily for sob and chest tightness. He is currently on a LAMA and will change inhaler to LABA/LAMA -Stop Incruse Ellipta, Start Anoro Ellipta

## 2021-10-05 NOTE — Assessment & Plan Note (Addendum)
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 He has been having headaches, dizziness with standing, GI upset, irritability, poor appetite, poor sleep. He has had good energy and concentration. He says some of these things may be related to his new medications. He was in distress, fidgety, anxious and not making much direct eye contact upon seeing the patient in the room. This is likely anxiety secondary to personal and family life changes and could be some post MI depression. The patient did not meet MDD criteria per questioning but scored 15 on the PHQ 9. PDMP reviewed for patient who has 0 risk and no recorder prescriptions. -Clonazepam 0.5 mg BID for 14 days -Lexipro 10mg  daily with 4 week follow up

## 2021-10-05 NOTE — Progress Notes (Signed)
Established Patient Office Visit  Subjective   Patient ID: Michael Bell, male    DOB: Jul 19, 1960  Age: 61 y.o. MRN: [id number]  Chief Complaint  Patient presents with   Follow-up    FOLLOW UP / COMPLAIN OF CHEST PAIN # 4 FOR ABOUT COUPLE OF DAY. PATIENT STATES HE THINKS IT IS DUE TO STRESS  / MEDICATION REFILL .    Michael Bell is a 61 y/o male with a pmh outlined below who presents for chest pain and anxiety. He was recently hospitalized in May 2023 for NSTEMI and newly diagnosed heart failure. He has been following with cardiology since and is on appropriate goal directed therapy and taking all medications as indicated without missed doses. He describes his chest pain today as substernal, sharp, no pressure or heaviness, non-exertional, not worsened by breathing and as intermittently coming and going. He did not have accompanying N/V or diaphoresis.It is the same pain he was having when he saw cardiology in late June. He has recently been sleeping with 2-3 pillows, has had some episodes of pnd, endorses being around his dry weight of 235 lbs, is not experiencing worsening SOB or cough and sputum production different than his baseline. 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 He has been having headaches, dizziness with standing, GI upset, irritability, poor appetite, poor sleep. He has had good energy and concentration. He says some of these things may be related to his new medications.      Review of Systems  All other systems reviewed and are negative.     Objective:     BP (!) 161/109 (BP Location: Left Arm, Patient Position: Sitting, Cuff Size: Normal)   Pulse 80   Temp (!) 97.5 F (36.4 C) (Oral)   Ht _0  (1.905 m)   Wt 238 lb 14.4 oz (108.4 kg)   SpO2 97%   BMI 29.86 kg/m    Physical Exam Constitutional:      General: He is in acute  distress.     Comments: Fidgety, anxious, looks at ground when talking  Cardiovascular:     Rate and Rhythm: Normal rate and regular rhythm.     Pulses: Normal pulses.     Heart sounds: Normal heart sounds. No murmur heard. Pulmonary:     Effort: Pulmonary effort is normal.     Comments: Audible wheezing, lungs otherwise with no crackles and good airflow throughout Abdominal:     General: Bowel sounds are normal.  Musculoskeletal:     Right lower leg: No edema.     Left lower leg: No edema.  Skin:    General: Skin is warm and dry.     Capillary Refill: Capillary refill takes 2 to 3 seconds.  Neurological:     Mental Status: He is alert.      No results found for any visits on 10/05/21.    The ASCVD Risk score (Arnett DK, et al., 2019) failed to calculate for the following reasons:   The patient has a prior MI or stroke diagnosis    Assessment & Plan:   Problem List Items Addressed This Visit       Cardiovascular and Mediastinum   HTN, goal below 130/80    BP increased in clinic to 161/103, above goal of <130/80. Patient reports good adherence to medication regimen. -Increase entresto to 97-103  Relevant Medications   sacubitril-valsartan (ENTRESTO) 97-103 MG   Chronic HFrEF (heart failure with reduced ejection fraction) (HCC)    HFrEF CAD: Diagnosed May 2023 w EF 16-01%,UXNAT 2 diastolic dysfunction,and also cathed at that time for NSTEMI during hospitalization.  LVH voltage criteria is met on EKG. He is following with cardiology outpatient and last saw them  09/23/21:indicated to repeat BMP in 1 week and uptitrate entresto or start spiro if blood pressure can handle it. Today his blood pressure was elevated and his entresto was uptitrated. He was having continue substernal, sharp, non-exertional chest pain and EKG was acquired. EKG had no ST changes or new T wave changes and was stable from prior demonstrating sinus origin, LVH, new q waves in III,aVF consistent  with a prior infarct. Do not feel that chest pain is cardiac and think it is more likely related to anxiety. Also do not feel it is a PE given currently on DAPT, no exacerbation with breathing, normal respiratory rate and oxygen saturation, normal heart rate and no signs of RV strain on ekg.  - Increase to Entresto 97-169m daily  -Coreg 25 mg daily -Farxiga 134m-LAsix 40 mg -Ranolazine 50034mID -ASA 26m44mily -Clopidogril 75mg85mly -pro BNP, BMP, mag       Relevant Medications   sacubitril-valsartan (ENTRESTO) 97-103 MG   Other Relevant Orders   BMP8+Anion Gap   Magnesium   Pro b natriuretic peptide     Respiratory   Emphysema of lung (HCC) [place] with emphysematous changes and new PFTs 08/25/21 with FEV1/FVC of 67 indicating COPD in the setting of smoking 2-3 packs daily for 45 years (age [phone] 97% on room air today with audible wheezing but otherwise good air movement in lungs and no crackles with normal respiratory effort. He is using his rescue albuterol inhaler 2-3 times daily for sob and chest tightness. He is currently on a LAMA and will change inhaler to LABA/LAMA -Stop Incruse Ellipta, Start Anoro Ellipta      Relevant Medications   umeclidinium-vilanterol (ANORO ELLIPTA) 62.5-25 MCG/ACT AEPB     Other   Tobacco user    Patient with >20 pack year smoking history, currently smoking again and age [phone] criteria for low dose lung cancer screening. Had CTA PE study performed in 12/2020 with no new lymphadenopathy and no focal nodules seen.      Relevant Medications   umeclidinium-vilanterol (ANORO ELLIPTA) 62.5-25 MCG/ACT AEPB   Anxiety    Michael Bell has been dealing with a lot of stress becoming the primary care giver for his father who was recently hospitalized, and his long term partner leaving him recently. He has been having headaches, dizziness with standing, GI upset, irritability, poor appetite, poor sleep. He has had good  energy and concentration. He says some of these things may be related to his new medications. He was in distress, fidgety, anxious and not making much direct eye contact upon seeing the patient in the room. This is likely anxiety secondary to personal and family life changes and could be some post MI depression. The patient did not meet MDD criteria per questioning but scored 15 on the PHQ 9. PDMP reviewed for patient who has 0 risk and no recorder prescriptions. -Clonazepam 0.5 mg BID for 14 days -Lexipro 10mg 58my with 4 week follow up      Relevant Medications   escitalopram (LEXAPRO) 10 MG tablet   clonazePAM (KLONOPIN) 0.5 MG  tablet   Other Visit Diagnoses     Chest pain, unspecified type    -  Primary   Relevant Orders   EKG 12-Lead (Completed)       No follow-ups on file.    Iona Coach, MD

## 2021-10-05 NOTE — Patient Instructions (Addendum)
Thank you, [name] for allowing [place] to provide your care today. Today we discussed :  Anxiety- You have been dealing with a lot of personal and family life changes recently. Anxiety can not only cause you to feel anxious, but can also cause headaches, dizziness, shortness of breath, chest pain, GI upset, sleep issues to name a few. We are starting you on an anxiety medication called Lexipro. This takes around 4 weeks to take full affect. To help in the next few weeks we have given you a medication called clonazepam that will help with your mood short term until we can see you back in 4 weeks.  Chest pain- Your chest pain is not causing pressure and is not related to activity. We did an ekg to make sure it wasn't coming from your heart and your ekg looked normal compared to your baseline. This is not likely pain from your heart.  Heart Failure  High blood pressure- Your blood pressure was a little elevated today. Your heart doctors said that your entresto medication could be increased if your blood pressure was high, so we are increasing this medication today. Your other heart medications will remain the same.  Emphysema- You have a condition called emphysema due to smoking. You have been using your rescue albuterol inhaler daily for shortness of breath and chest tightness. We have added a medication to your daily inhaler to help with this. You can still use your albuterol as needed.  I have ordered the following labs for you:  Lab Orders         BMP8+Anion Gap         Magnesium         Pro b natriuretic peptide        I have ordered the following medication/changed the following medications:   Stop the following medications: Medications Discontinued During This Encounter  Medication Reason   umeclidinium bromide (INCRUSE ELLIPTA) 62.5 MCG/ACT AEPB Change in therapy   sacubitril-valsartan (ENTRESTO) 49-51 MG Change in therapy   clonazePAM (KLONOPIN) 0.5 MG tablet      Start the  following medications: Meds ordered this encounter  Medications   umeclidinium-vilanterol (ANORO ELLIPTA) 62.5-25 MCG/ACT AEPB    Sig: Inhale 1 puff into the lungs daily.    Dispense:  30 each    Refill:  3   escitalopram (LEXAPRO) 10 MG tablet    Sig: Take 1 tablet (10 mg total) by mouth daily.    Dispense:  30 tablet    Refill:  1   DISCONTD: clonazePAM (KLONOPIN) 0.5 MG tablet    Sig: Take 1 tablet (0.5 mg total) by mouth 2 (two) times daily for 14 days.    Dispense:  28 tablet    Refill:  0   sacubitril-valsartan (ENTRESTO) 97-103 MG    Sig: Take 1 tablet by mouth 2 (two) times daily.    Dispense:  60 tablet    Refill:  3   clonazePAM (KLONOPIN) 0.5 MG tablet    Sig: Take 1 tablet (0.5 mg total) by mouth 2 (two) times daily for 14 days.    Dispense:  28 tablet    Refill:  0     Follow up:  4 weeks     We look forward to seeing you next time. Please call our clinic at [phone] if you have any questions or concerns. The best time to call is Monday-Friday from 9am-4pm, but there is someone available 24/7. If after hours or the  weekend, call the main hospital number and ask for the Internal Medicine Resident On-Call. If you need medication refills, please notify your pharmacy one week in advance and they will send [place] a request.   Thank you for trusting me with your care. Wishing you the best!  Willette [facility]

## 2021-10-05 NOTE — Assessment & Plan Note (Addendum)
HFrEF CAD: Diagnosed May 2023 w EF 32-25%,OHCSP 2 diastolic dysfunction,and also cathed at that time for NSTEMI during hospitalization.  LVH voltage criteria is met on EKG. He is following with cardiology outpatient and last saw them  09/23/21:indicated to repeat BMP in 1 week and uptitrate entresto or start spiro if blood pressure can handle it. Today his blood pressure was elevated and his entresto was uptitrated. He was having continue substernal, sharp, non-exertional chest pain and EKG was acquired. EKG had no ST changes or new T wave changes and was stable from prior demonstrating sinus origin, LVH, new q waves in III,aVF consistent with a prior infarct. Do not feel that chest pain is cardiac and think it is more likely related to anxiety. Also do not feel it is a PE given currently on DAPT, no exacerbation with breathing, normal respiratory rate and oxygen saturation, normal heart rate and no signs of RV strain on ekg.  - Increase to Entresto 97-194m daily  -Coreg 25 mg daily -Farxiga 125m-LAsix 40 mg -Ranolazine 50053mID -ASA 49m16mily -Clopidogril 75mg80mly -pro BNP, BMP, mag

## 2021-10-05 NOTE — Assessment & Plan Note (Signed)
BP increased in clinic to 161/103, above goal of <130/80. Patient reports good adherence to medication regimen. -Increase entresto to 97-103

## 2021-10-05 NOTE — Assessment & Plan Note (Signed)
Patient with >20 pack year smoking history, currently smoking again and age 61-80 y/o meets criteria for low dose lung cancer screening. Had CTA PE study performed in 12/2020 with no new lymphadenopathy and no focal nodules seen.

## 2021-10-06 ENCOUNTER — Telehealth: Payer: Self-pay

## 2021-10-06 LAB — MAGNESIUM: Magnesium: 2.1 mg/dL (ref 1.6–2.3)

## 2021-10-06 LAB — BMP8+ANION GAP
Anion Gap: 11 mmol/L (ref 10.0–18.0)
BUN/Creatinine Ratio: 15 (ref 10–24)
BUN: 16 mg/dL (ref 8–27)
CO2: 23 mmol/L (ref 20–29)
Calcium: 9.1 mg/dL (ref 8.6–10.2)
Chloride: 107 mmol/L — ABNORMAL HIGH (ref 96–106)
Creatinine, Ser: 1.07 mg/dL (ref 0.76–1.27)
Glucose: 101 mg/dL — ABNORMAL HIGH (ref 70–99)
Potassium: 4.7 mmol/L (ref 3.5–5.2)
Sodium: 141 mmol/L (ref 134–144)
eGFR: 79 mL/min/{1.73_m2} (ref >59)

## 2021-10-06 LAB — PRO B NATRIURETIC PEPTIDE: NT-Pro BNP: 266 pg/mL — ABNORMAL HIGH (ref 0–210)

## 2021-10-06 NOTE — Telephone Encounter (Signed)
Spoke to patient on the phone about recent lab results. Discussed normal renal function, electrolytes and BNP results.

## 2021-10-12 ENCOUNTER — Other Ambulatory Visit (HOSPITAL_COMMUNITY): Payer: Self-pay

## 2021-10-13 ENCOUNTER — Other Ambulatory Visit (HOSPITAL_COMMUNITY): Payer: Self-pay

## 2021-10-13 NOTE — Progress Notes (Signed)
Internal Medicine Clinic Attending  I saw and evaluated the patient.  I personally confirmed the key portions of the history and exam documented by the resident  and I reviewed pertinent patient test results.  The assessment, diagnosis, and plan were formulated together and I agree with the documentation in the resident's note.  

## 2021-11-02 ENCOUNTER — Other Ambulatory Visit (HOSPITAL_COMMUNITY): Payer: Self-pay

## 2021-11-03 ENCOUNTER — Other Ambulatory Visit (HOSPITAL_COMMUNITY): Payer: Self-pay

## 2021-11-03 ENCOUNTER — Other Ambulatory Visit: Payer: Self-pay | Admitting: Student

## 2021-11-03 NOTE — Telephone Encounter (Signed)
Next appt scheduled tomorrow 8/10 with Dr Darliss Ridgel.

## 2021-11-04 ENCOUNTER — Encounter: Payer: 59 | Admitting: Student

## 2021-11-05 ENCOUNTER — Other Ambulatory Visit (HOSPITAL_COMMUNITY): Payer: Self-pay

## 2021-11-05 MED ORDER — FUROSEMIDE 40 MG PO TABS
40.0000 mg | ORAL_TABLET | Freq: Every day | ORAL | 3 refills | Status: DC
  Filled 2021-11-05: qty 90, 90d supply, fill #0
  Filled 2022-04-26: qty 30, 30d supply, fill #1

## 2021-11-06 ENCOUNTER — Other Ambulatory Visit (HOSPITAL_COMMUNITY): Payer: Self-pay

## 2021-11-11 ENCOUNTER — Ambulatory Visit: Payer: 59 | Admitting: Cardiology

## 2021-11-15 ENCOUNTER — Other Ambulatory Visit: Payer: Self-pay

## 2021-11-15 ENCOUNTER — Other Ambulatory Visit (HOSPITAL_COMMUNITY): Payer: Self-pay

## 2021-11-15 ENCOUNTER — Other Ambulatory Visit: Payer: Self-pay | Admitting: Student

## 2021-11-15 DIAGNOSIS — F419 Anxiety disorder, unspecified: Secondary | ICD-10-CM

## 2021-11-15 MED ORDER — CLOPIDOGREL BISULFATE 75 MG PO TABS
75.0000 mg | ORAL_TABLET | Freq: Every day | ORAL | 2 refills | Status: DC
  Filled 2021-11-15: qty 30, 30d supply, fill #0
  Filled 2021-12-20: qty 30, 30d supply, fill #1
  Filled 2022-02-07: qty 30, 30d supply, fill #2

## 2021-11-16 ENCOUNTER — Other Ambulatory Visit: Payer: Self-pay | Admitting: Student

## 2021-11-16 ENCOUNTER — Other Ambulatory Visit (HOSPITAL_COMMUNITY): Payer: Self-pay

## 2021-11-16 MED ORDER — ESCITALOPRAM OXALATE 10 MG PO TABS
10.0000 mg | ORAL_TABLET | Freq: Every day | ORAL | 1 refills | Status: DC
  Filled 2021-11-16 – 2022-01-26 (×2): qty 30, 30d supply, fill #0

## 2021-11-16 NOTE — Telephone Encounter (Signed)
Please call patient to schedule a follow-up visit. Looks like he canceled his last appointment.

## 2021-11-17 ENCOUNTER — Other Ambulatory Visit (HOSPITAL_COMMUNITY): Payer: Self-pay

## 2021-11-17 NOTE — Telephone Encounter (Signed)
Please schedule pt a f/u visit per Dr Kirke Corin. Thanks

## 2021-12-14 ENCOUNTER — Ambulatory Visit: Payer: Self-pay | Admitting: Cardiology

## 2021-12-15 NOTE — Progress Notes (Deleted)
Chronic HFrEF (heart failure with reduced ejection fraction) ([place]) Start spiro? Add ezetimibe?    Patient Active Problem List   Diagnosis Date Noted   Emphysema of lung ([place]) 10/05/2021   Anxiety 10/05/2021   Chronic HFrEF (heart failure with reduced ejection fraction) ([place]) 08/13/2021   Alcohol use 98/92/1194   Acute diastolic heart failure ([place])    Hypertensive urgency    HNP (herniated nucleus pulposus), lumbar 01/03/2014   HTN, goal below 130/80 07/22/2013   Chronic right hip pain 07/22/2013   Tobacco user 07/22/2013     Current Outpatient Medications:    albuterol (VENTOLIN HFA) 108 (90 Base) MCG/ACT inhaler, Inhale 1-2 puffs into the lungs every 4 (four) hours as needed for wheezing or shortness of breath., Disp: 8.5 g, Rfl: 2   aspirin EC 81 MG tablet, Take 1 tablet (81 mg total) by mouth daily., Disp: 30 tablet, Rfl: 5   atorvastatin (LIPITOR) 80 MG tablet, Take 1 tablet (80 mg total) by mouth at bedtime., Disp: 30 tablet, Rfl: 2   carvedilol (COREG) 25 MG tablet, Take 1 tablet (25 mg total) by mouth 2 (two) times daily with a meal., Disp: 60 tablet, Rfl: 2   clonazePAM (KLONOPIN) 0.5 MG tablet, Take 1 tablet (0.5 mg total) by mouth 2 (two) times daily for 14 days., Disp: 28 tablet, Rfl: 0   clopidogrel (PLAVIX) 75 MG tablet, Take 1 tablet (75 mg total) by mouth daily., Disp: 30 tablet, Rfl: 2   dapagliflozin propanediol (FARXIGA) 10 MG TABS tablet, Take 1 tablet by mouth daily before breakfast., Disp: 90 tablet, Rfl: 0   escitalopram (LEXAPRO) 10 MG tablet, Take 1 tablet (10 mg total) by mouth daily., Disp: 30 tablet, Rfl: 1   fluticasone (FLONASE) 50 MCG/ACT nasal spray, Place 2 sprays into both nostrils daily., Disp: 16 g, Rfl: 2   furosemide (LASIX) 40 MG tablet, Take 1 tablet (40 mg total) by mouth daily., Disp: 90 tablet, Rfl: 3   ranolazine (RANEXA) 500 MG 12 hr tablet, Take 1 tablet by mouth 2 times daily., Disp: 180 tablet, Rfl: 0   sacubitril-valsartan (ENTRESTO)  97-103 MG, Take 1 tablet by mouth 2 (two) times daily., Disp: 60 tablet, Rfl: 3   umeclidinium-vilanterol (ANORO ELLIPTA) 62.5-25 MCG/ACT AEPB, Inhale 1 puff into the lungs daily., Disp: 30 each, Rfl: 3  Past Surgical History:  Procedure Laterality Date   LUMBAR LAMINECTOMY N/A 01/03/2014   Procedure: RIGHT EXTRAFORAMINAL APPROACH TO 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;  Surgeon: Jessy Oto, MD;  Location: [facility];  Service: Orthopedics;  Laterality: N/A;   NOSE SURGERY     40 yrs ago   RIGHT/LEFT HEART CATH AND CORONARY ANGIOGRAPHY N/A 08/05/2021   Procedure: RIGHT/LEFT HEART CATH AND CORONARY ANGIOGRAPHY;  Surgeon: Adrian Prows, MD;  Location: [facility];  Service: Cardiovascular;  Laterality: N/A;       Latest Ref Rng & Units 10/05/2021   10:01 AM 08/24/2021    8:09 AM 08/06/2021   10:13 AM  CMP  Glucose 70 - 99 mg/dL 101  134  111   BUN 8 - 27 mg/dL 16  19  24    Creatinine 0.76 - 1.27 mg/dL 1.07  0.98  1.14   Sodium 134 - 144 mmol/L 141  138  138   Potassium 3.5 - 5.2 mmol/L 4.7  4.2  4.5   Chloride 96 - 106 mmol/L 107  100  106   CO2 20 - 29 mmol/L 23  23  22   Calcium 8.6 - 10.2 mg/dL 9.1  9.2  8.7       Latest Ref Rng & Units 08/06/2021   10:13 AM 08/05/2021    8:14 AM 08/05/2021    8:10 AM  CBC  WBC 4.0 - 10.5 K/uL 9.8     Hemoglobin 13.0 - 17.0 g/dL 50.9  32.6  71.2   Hematocrit 39.0 - 52.0 % 45.8  40.0  40.0   Platelets 150 - 400 K/uL 305         Latest Ref Rng & Units 08/05/2021    4:47 AM 08/04/2021    7:00 PM  LIPIDS  Total cholesterol 0 - 200 mg/dL 458    HDL cholesterol >40 mg/dL 43    LDL (calc) 0 - 99 mg/dL 86    LDL (direct) 0 - 99 mg/dL  09.9   T.Chol/HDL Ratio RATIO 3.1    VLDL cholesterol 0 - 40 mg/dL 4    Triglycerides <833 mg/dL 22        Latest Ref Rng & Units 08/04/2021    6:56 PM  Hemoglobin A1C  Hemoglobin-A1c 4.8 - 5.6 % 5.6

## 2021-12-16 ENCOUNTER — Encounter: Payer: 59 | Admitting: Student

## 2021-12-20 ENCOUNTER — Other Ambulatory Visit (HOSPITAL_COMMUNITY): Payer: Self-pay

## 2021-12-20 ENCOUNTER — Other Ambulatory Visit: Payer: Self-pay | Admitting: Student

## 2021-12-20 MED ORDER — ATORVASTATIN CALCIUM 80 MG PO TABS
80.0000 mg | ORAL_TABLET | Freq: Every day | ORAL | 2 refills | Status: DC
  Filled 2021-12-20: qty 30, 30d supply, fill #0
  Filled 2022-01-26: qty 30, 30d supply, fill #1
  Filled 2022-02-07 – 2022-02-09 (×2): qty 30, 30d supply, fill #2

## 2021-12-20 MED ORDER — CARVEDILOL 25 MG PO TABS
25.0000 mg | ORAL_TABLET | Freq: Two times a day (BID) | ORAL | 2 refills | Status: DC
  Filled 2021-12-20 – 2021-12-22 (×2): qty 60, 30d supply, fill #0
  Filled 2022-01-26: qty 60, 30d supply, fill #1
  Filled 2022-04-26: qty 60, 30d supply, fill #2

## 2021-12-22 ENCOUNTER — Other Ambulatory Visit (HOSPITAL_COMMUNITY): Payer: Self-pay

## 2021-12-27 ENCOUNTER — Ambulatory Visit: Payer: Self-pay | Admitting: Cardiology

## 2022-01-26 ENCOUNTER — Other Ambulatory Visit (HOSPITAL_COMMUNITY): Payer: Self-pay

## 2022-01-26 ENCOUNTER — Other Ambulatory Visit: Payer: Self-pay | Admitting: Cardiology

## 2022-01-26 DIAGNOSIS — I25119 Atherosclerotic heart disease of native coronary artery with unspecified angina pectoris: Secondary | ICD-10-CM

## 2022-01-26 DIAGNOSIS — I5042 Chronic combined systolic (congestive) and diastolic (congestive) heart failure: Secondary | ICD-10-CM

## 2022-01-26 MED ORDER — RANOLAZINE ER 500 MG PO TB12
500.0000 mg | ORAL_TABLET | Freq: Two times a day (BID) | ORAL | 0 refills | Status: DC
  Filled 2022-01-26: qty 180, 90d supply, fill #0

## 2022-01-26 MED ORDER — DAPAGLIFLOZIN PROPANEDIOL 10 MG PO TABS
10.0000 mg | ORAL_TABLET | Freq: Every day | ORAL | 0 refills | Status: DC
  Filled 2022-01-26: qty 90, 90d supply, fill #0

## 2022-01-27 ENCOUNTER — Other Ambulatory Visit (HOSPITAL_COMMUNITY): Payer: Self-pay

## 2022-02-03 ENCOUNTER — Ambulatory Visit: Payer: Self-pay | Admitting: Cardiology

## 2022-02-07 ENCOUNTER — Other Ambulatory Visit (HOSPITAL_COMMUNITY): Payer: Self-pay

## 2022-02-07 ENCOUNTER — Other Ambulatory Visit: Payer: Self-pay | Admitting: Student

## 2022-02-08 ENCOUNTER — Other Ambulatory Visit (HOSPITAL_COMMUNITY): Payer: Self-pay

## 2022-02-08 MED ORDER — ASPIRIN 81 MG PO TBEC
81.0000 mg | DELAYED_RELEASE_TABLET | Freq: Every day | ORAL | 5 refills | Status: DC
  Filled 2022-02-08: qty 30, 30d supply, fill #0

## 2022-02-08 NOTE — Telephone Encounter (Signed)
Chart reviewed. Aspirin refilled.

## 2022-02-09 ENCOUNTER — Other Ambulatory Visit (HOSPITAL_COMMUNITY): Payer: Self-pay

## 2022-02-14 ENCOUNTER — Other Ambulatory Visit: Payer: Self-pay | Admitting: Student

## 2022-02-15 ENCOUNTER — Other Ambulatory Visit (HOSPITAL_COMMUNITY): Payer: Self-pay

## 2022-02-15 MED ORDER — FLUTICASONE PROPIONATE 50 MCG/ACT NA SUSP
2.0000 | Freq: Every day | NASAL | 2 refills | Status: DC
  Filled 2022-02-15: qty 16, 30d supply, fill #0
  Filled 2022-12-20: qty 16, 30d supply, fill #1
  Filled 2022-12-21: qty 16, 30d supply, fill #0

## 2022-02-21 ENCOUNTER — Other Ambulatory Visit (HOSPITAL_COMMUNITY): Payer: Self-pay

## 2022-02-26 ENCOUNTER — Other Ambulatory Visit (HOSPITAL_COMMUNITY): Payer: Self-pay

## 2022-03-09 ENCOUNTER — Other Ambulatory Visit (HOSPITAL_COMMUNITY): Payer: Self-pay

## 2022-03-09 ENCOUNTER — Other Ambulatory Visit: Payer: Self-pay

## 2022-03-09 DIAGNOSIS — I5022 Chronic systolic (congestive) heart failure: Secondary | ICD-10-CM

## 2022-03-10 ENCOUNTER — Other Ambulatory Visit (HOSPITAL_COMMUNITY): Payer: Self-pay

## 2022-03-10 MED ORDER — ENTRESTO 97-103 MG PO TABS
1.0000 | ORAL_TABLET | Freq: Two times a day (BID) | ORAL | 3 refills | Status: DC
  Filled 2022-03-10 – 2022-04-26 (×2): qty 180, 90d supply, fill #0

## 2022-03-10 NOTE — Telephone Encounter (Signed)
Next appt scheduled 03/15/22 with PCP. 

## 2022-03-15 ENCOUNTER — Encounter: Payer: Self-pay | Admitting: Student

## 2022-03-22 ENCOUNTER — Other Ambulatory Visit (HOSPITAL_COMMUNITY): Payer: Self-pay

## 2022-04-26 ENCOUNTER — Other Ambulatory Visit: Payer: Self-pay | Admitting: Internal Medicine

## 2022-04-26 ENCOUNTER — Other Ambulatory Visit: Payer: Self-pay | Admitting: Cardiology

## 2022-04-26 ENCOUNTER — Other Ambulatory Visit: Payer: Self-pay

## 2022-04-26 ENCOUNTER — Other Ambulatory Visit (HOSPITAL_COMMUNITY): Payer: Self-pay

## 2022-04-26 DIAGNOSIS — Z720 Tobacco use: Secondary | ICD-10-CM

## 2022-04-26 DIAGNOSIS — I5042 Chronic combined systolic (congestive) and diastolic (congestive) heart failure: Secondary | ICD-10-CM

## 2022-04-26 DIAGNOSIS — I25119 Atherosclerotic heart disease of native coronary artery with unspecified angina pectoris: Secondary | ICD-10-CM

## 2022-04-26 DIAGNOSIS — J439 Emphysema, unspecified: Secondary | ICD-10-CM

## 2022-04-26 MED ORDER — ATORVASTATIN CALCIUM 80 MG PO TABS
80.0000 mg | ORAL_TABLET | Freq: Every day | ORAL | 2 refills | Status: DC
  Filled 2022-04-26: qty 30, 30d supply, fill #0

## 2022-04-26 MED ORDER — RANOLAZINE ER 500 MG PO TB12
500.0000 mg | ORAL_TABLET | Freq: Two times a day (BID) | ORAL | 0 refills | Status: DC
  Filled 2022-04-26 – 2022-05-19 (×2): qty 180, 90d supply, fill #0

## 2022-04-26 MED ORDER — ANORO ELLIPTA 62.5-25 MCG/ACT IN AEPB
1.0000 | INHALATION_SPRAY | Freq: Every day | RESPIRATORY_TRACT | 3 refills | Status: DC
  Filled 2022-04-26: qty 60, 30d supply, fill #0

## 2022-04-26 MED ORDER — DAPAGLIFLOZIN PROPANEDIOL 10 MG PO TABS
10.0000 mg | ORAL_TABLET | Freq: Every day | ORAL | 0 refills | Status: DC
  Filled 2022-04-26: qty 90, 90d supply, fill #0

## 2022-04-26 MED ORDER — CLOPIDOGREL BISULFATE 75 MG PO TABS
75.0000 mg | ORAL_TABLET | Freq: Every day | ORAL | 2 refills | Status: DC
  Filled 2022-04-26: qty 30, 30d supply, fill #0

## 2022-04-27 ENCOUNTER — Other Ambulatory Visit (HOSPITAL_COMMUNITY): Payer: Self-pay

## 2022-04-28 ENCOUNTER — Other Ambulatory Visit (HOSPITAL_COMMUNITY): Payer: Self-pay

## 2022-05-04 ENCOUNTER — Other Ambulatory Visit (HOSPITAL_COMMUNITY): Payer: Self-pay

## 2022-05-06 ENCOUNTER — Other Ambulatory Visit (HOSPITAL_COMMUNITY): Payer: Self-pay

## 2022-05-19 ENCOUNTER — Other Ambulatory Visit (HOSPITAL_COMMUNITY): Payer: Self-pay

## 2022-06-04 ENCOUNTER — Other Ambulatory Visit (HOSPITAL_COMMUNITY): Payer: Self-pay

## 2022-06-13 ENCOUNTER — Other Ambulatory Visit (HOSPITAL_COMMUNITY): Payer: Self-pay

## 2022-07-05 IMAGING — CR DG CHEST 2V
2 series · 2 of 2 positions shown · non-contrast
Comparison: Chest x-ray January 11, 2021.

CLINICAL DATA: Shortness of breath.

EXAM:
CHEST - 2 VIEW

[chest pa]
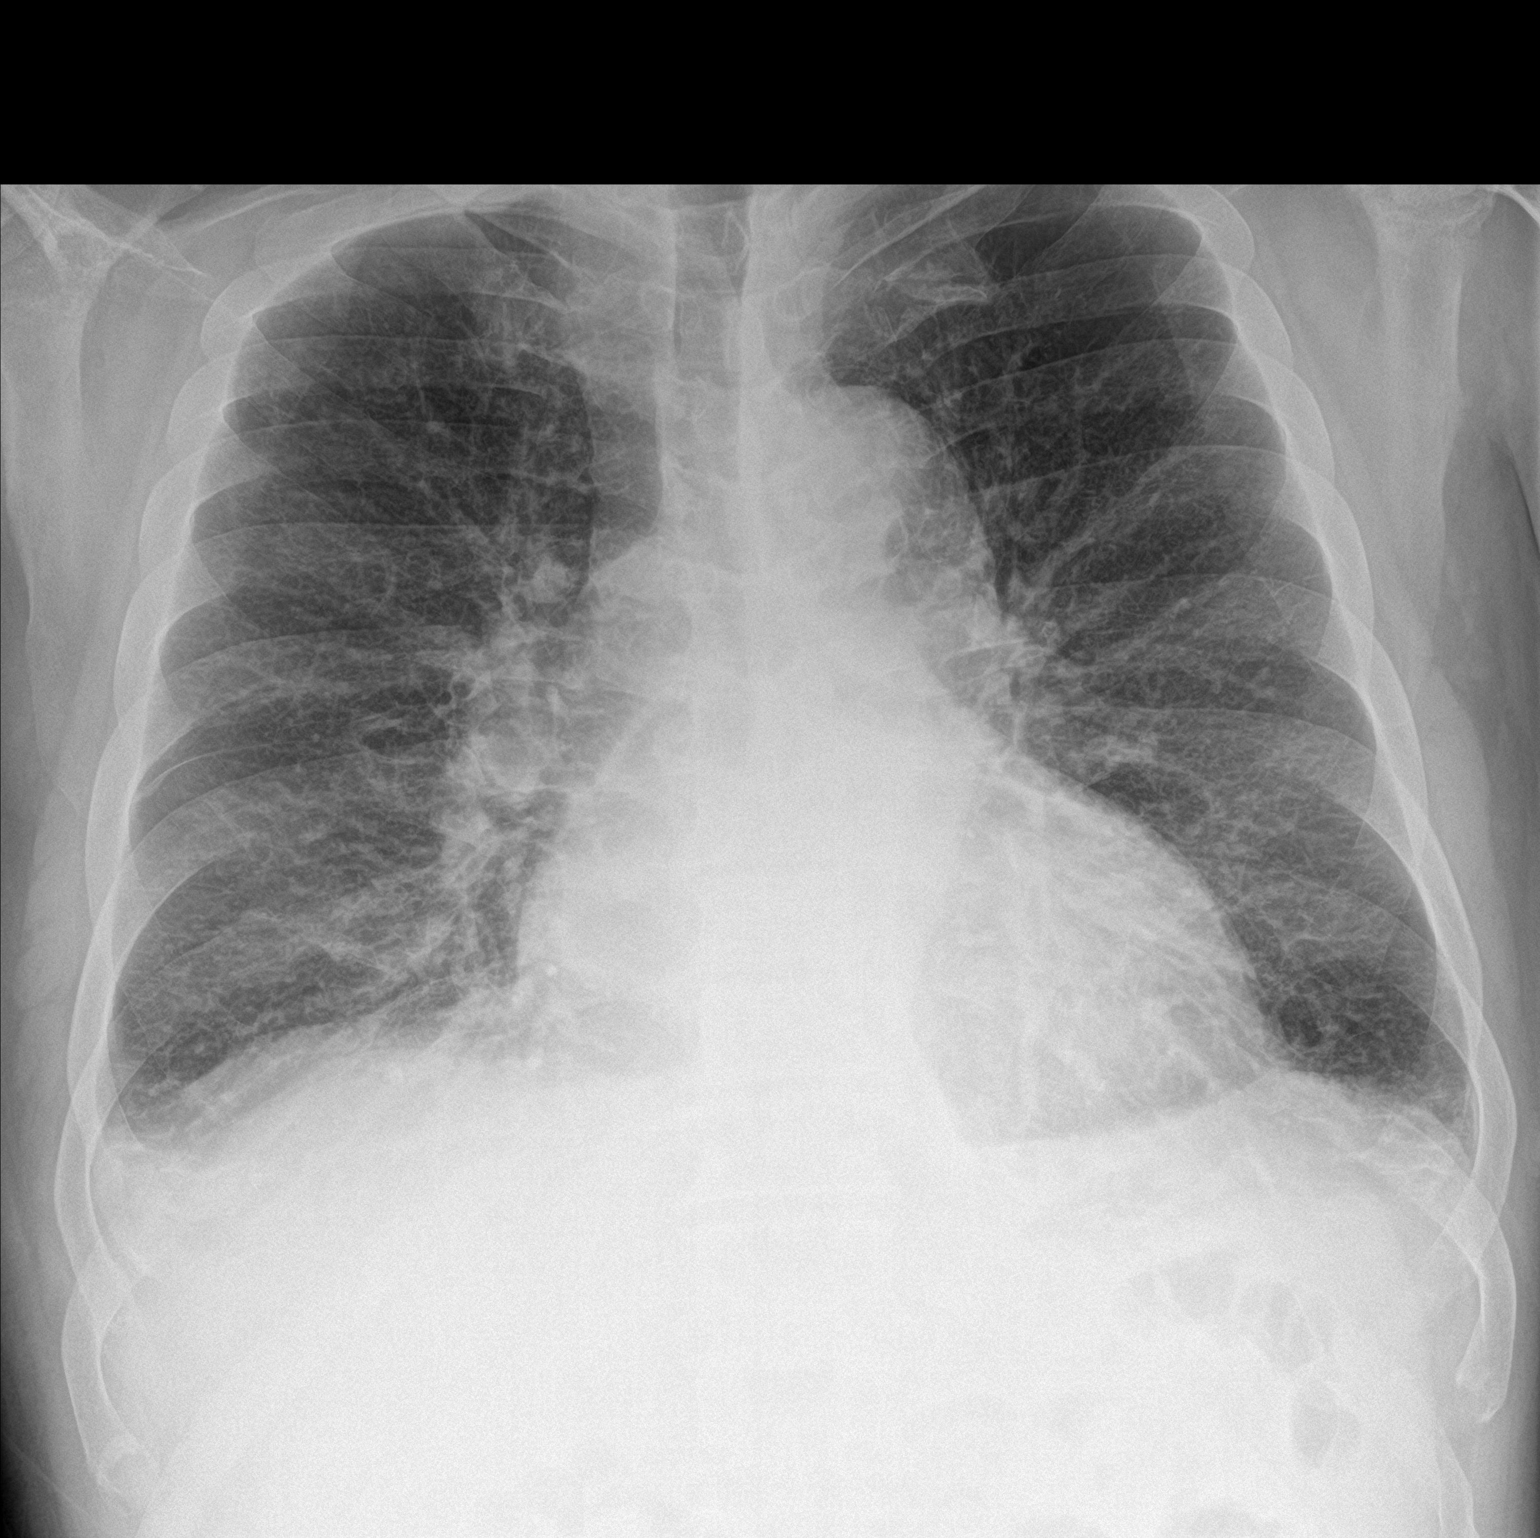

[chest lat]
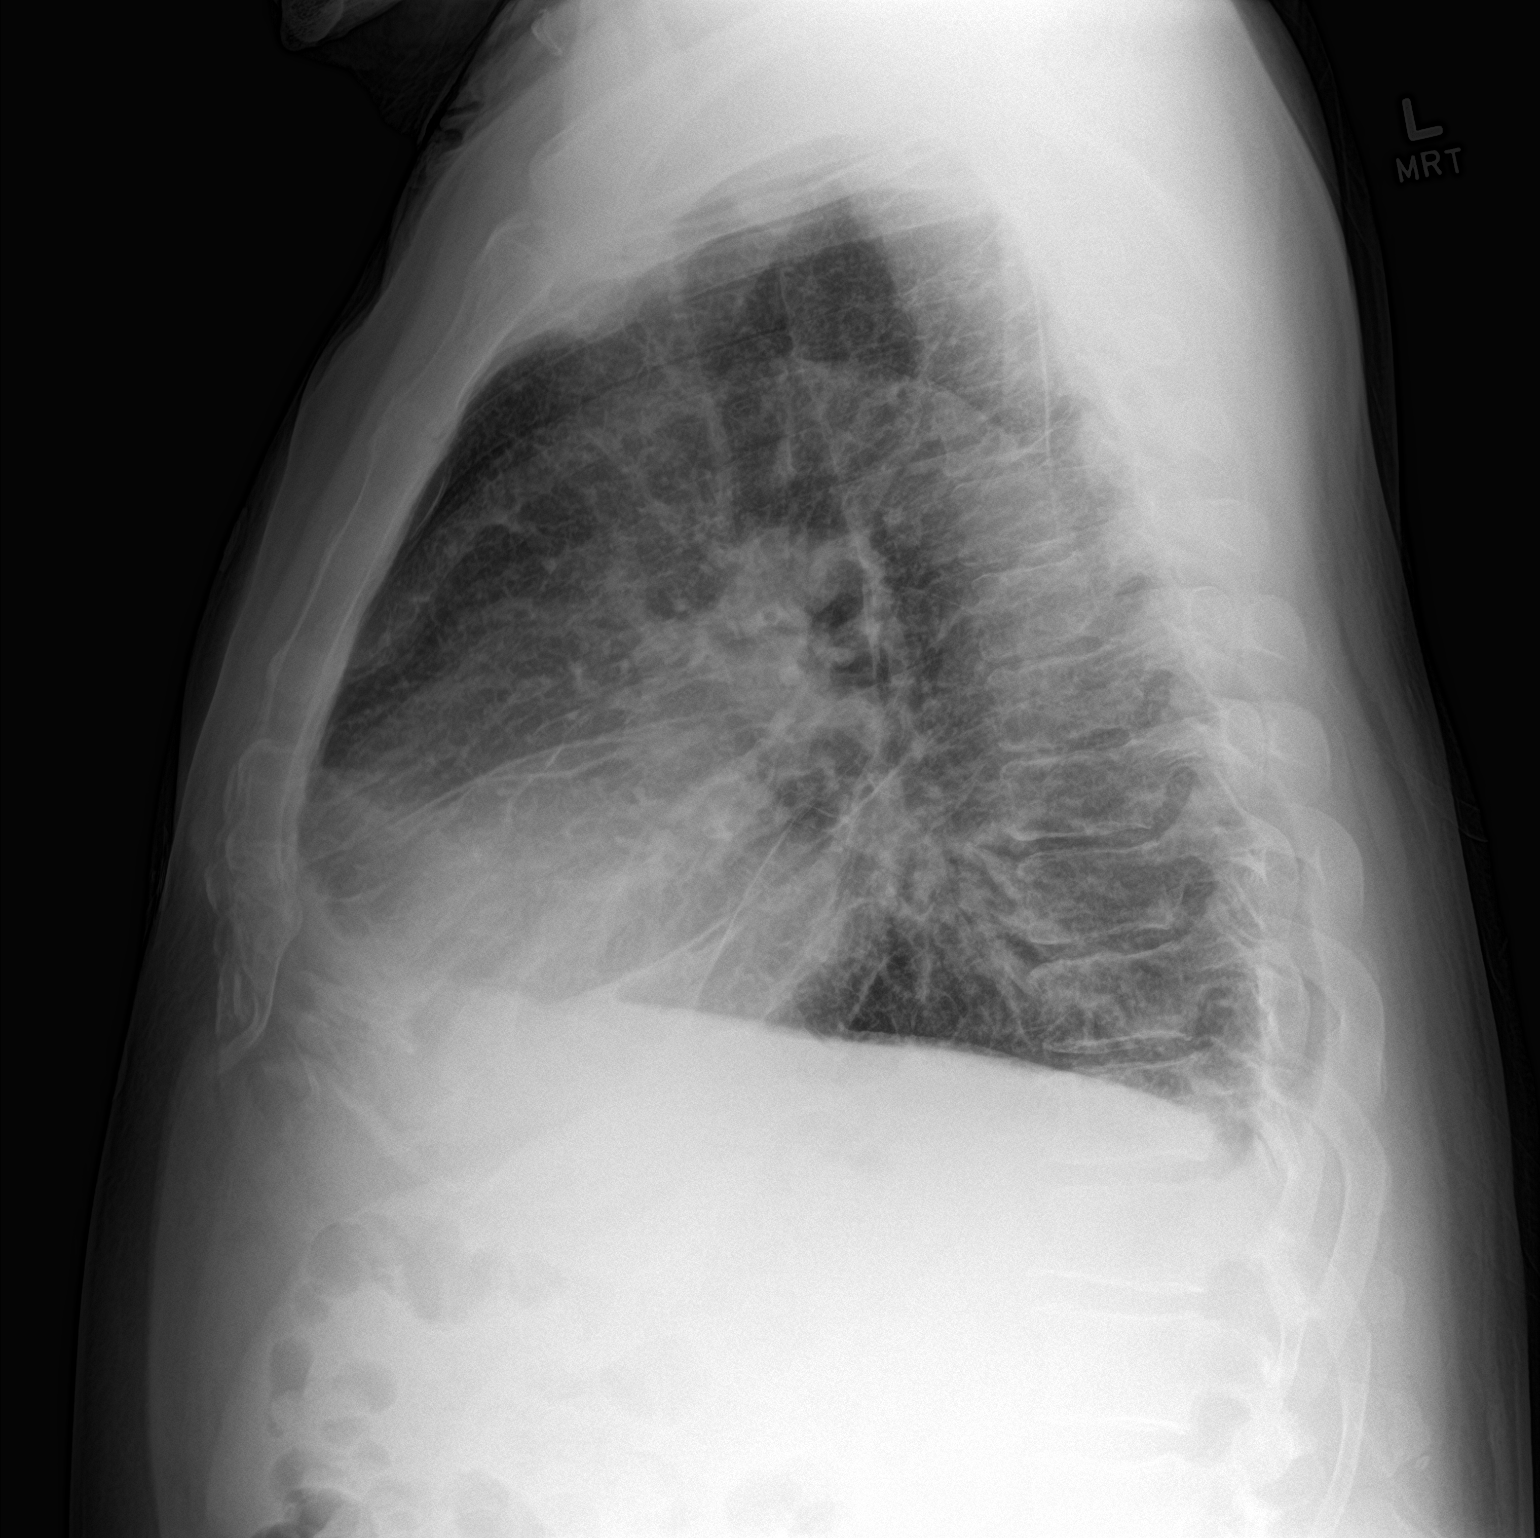

[2 of 2 positions shown; findings below may reference images not displayed]

FINDINGS: Small right pleural effusion. No visible pneumothorax. Mild diffuse
interstitial opacities. Mild enlargement the cardiac silhouette. No
acute fracture
IMPRESSION: Small right pleural effusion, mild cardiomegaly and mild diffuse
central prominence, suggestive of CHF with mild interstitial edema.

## 2022-07-06 IMAGING — US US AORTA
1 series · 14 of 25 positions shown · non-contrast
Comparison: None Available.

CLINICAL DATA: Abdominal aortic atherosclerotic disease.

EXAM:
ULTRASOUND OF ABDOMINAL AORTA
TECHNIQUE: Ultrasound examination of the abdominal aorta and proximal common
iliac arteries was performed to evaluate for aneurysm. Additional
color and Doppler images of the distal aorta were obtained to
document patency.

[Series 1: us aorta · 28 acquisitions, 14 frames shown]
[im 1/28]
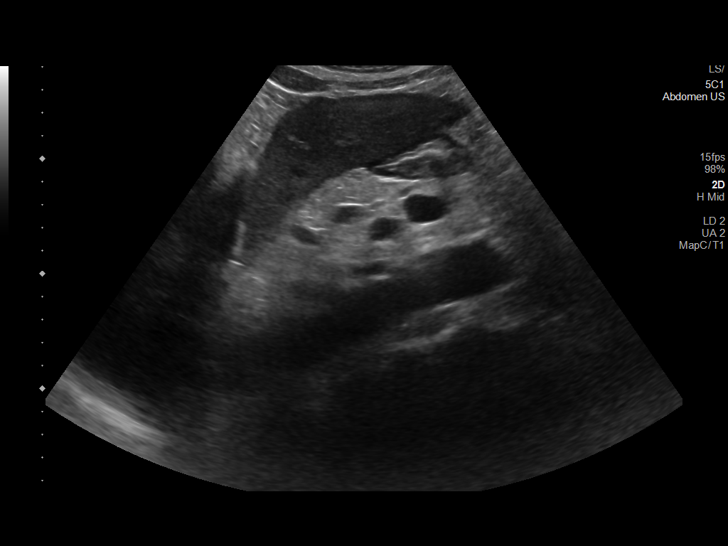
[im 3/28]
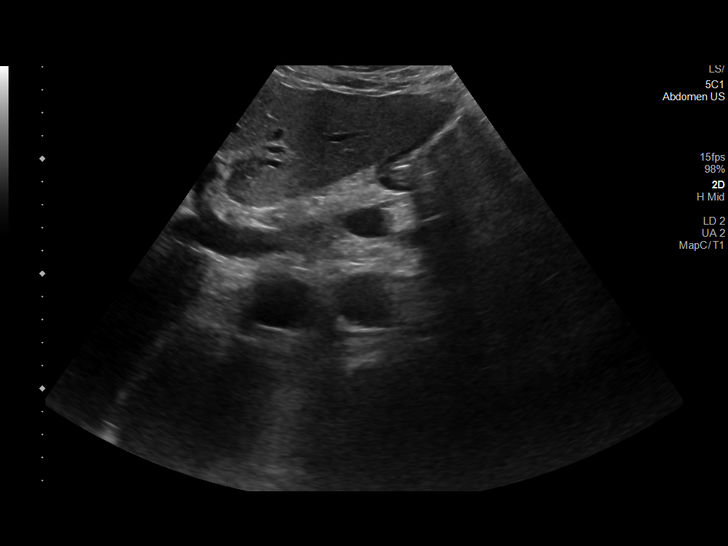
[im 5/28]
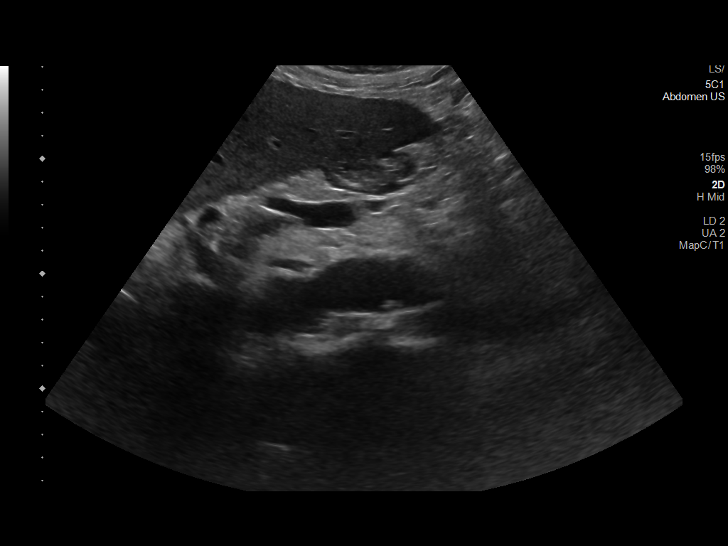
[im 7/28]
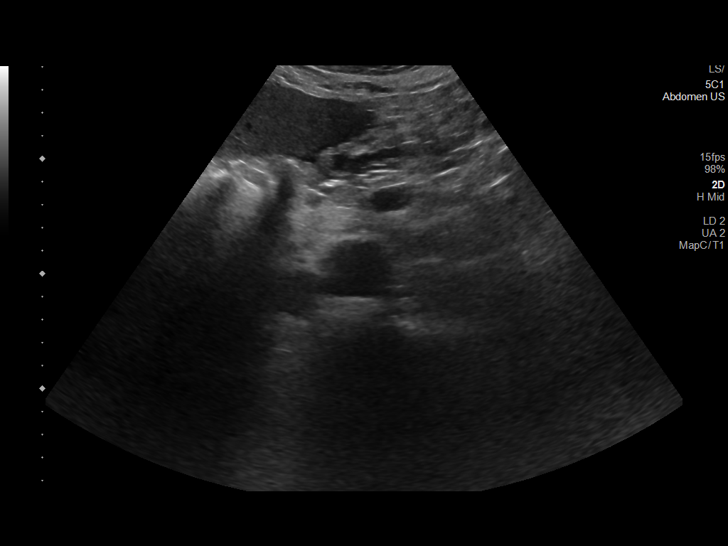
[im 10/28]
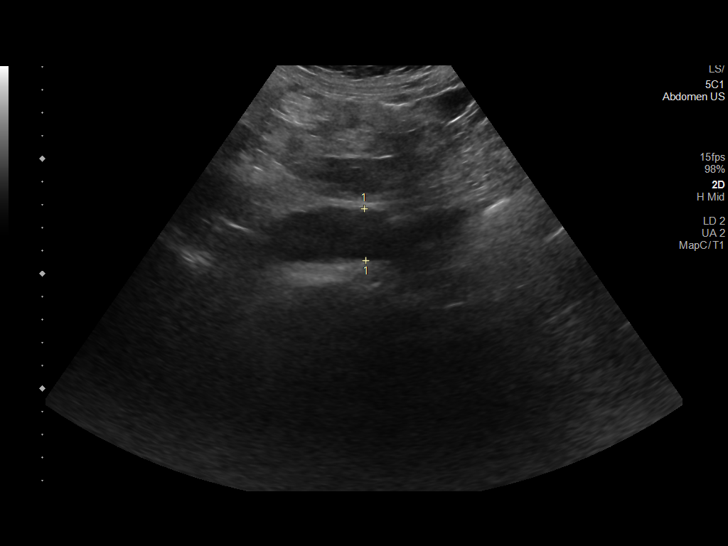
[im 11/28]
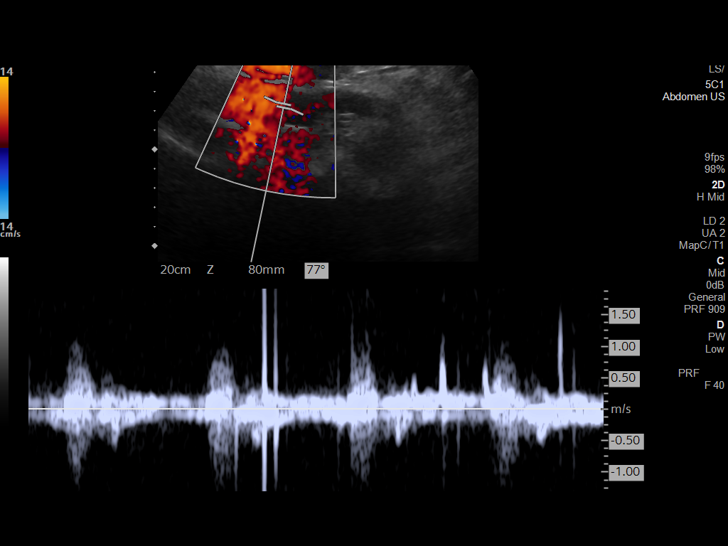
[im 13/28]
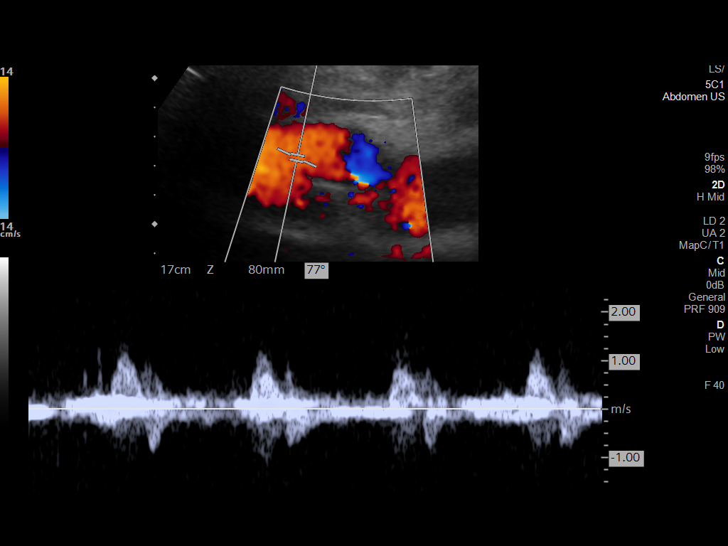
[im 15/28]
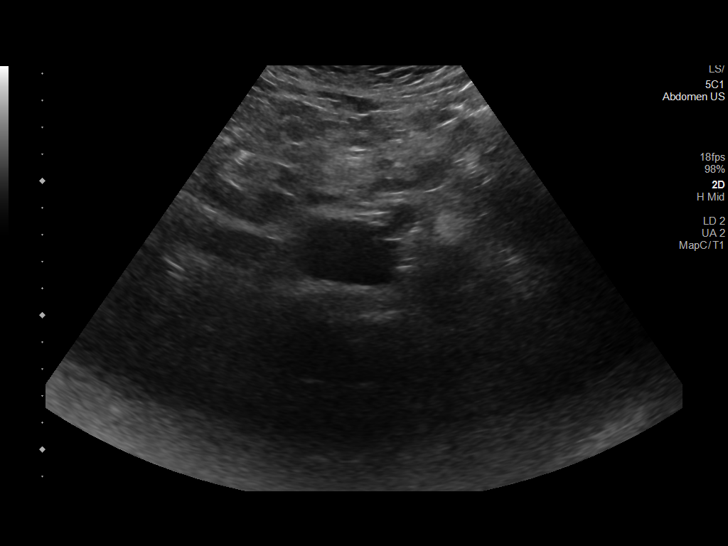
[im 17/28]
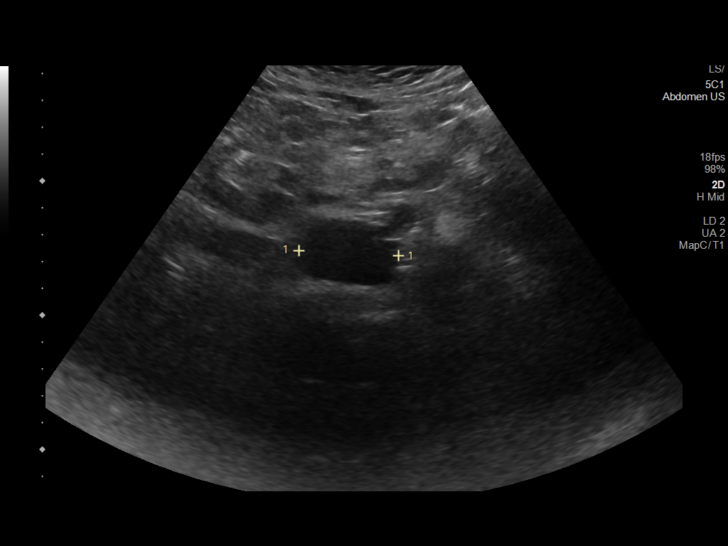
[im 19/28]
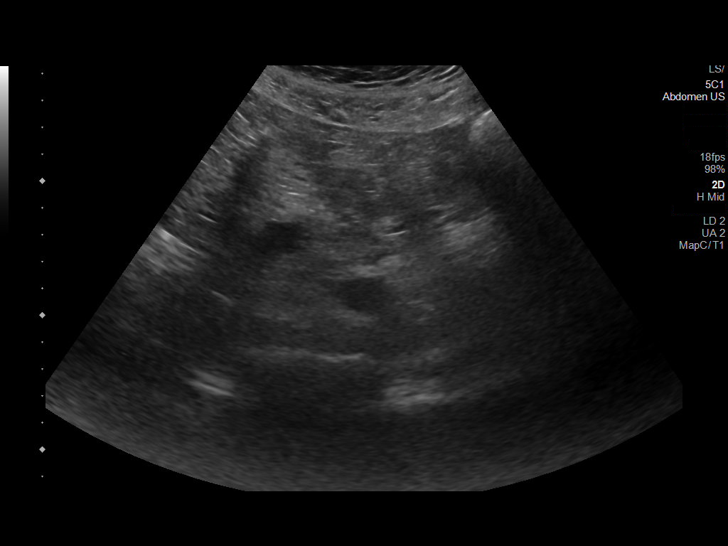
[im 21/28]
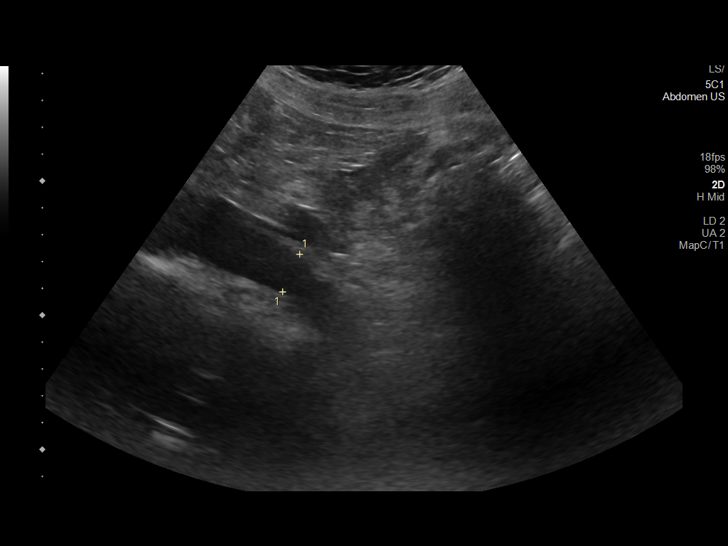
[im 23/28]
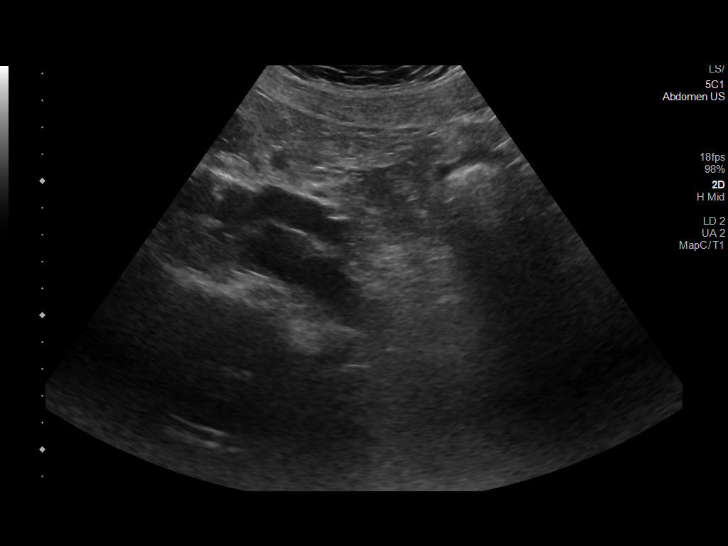
[im 25/28]
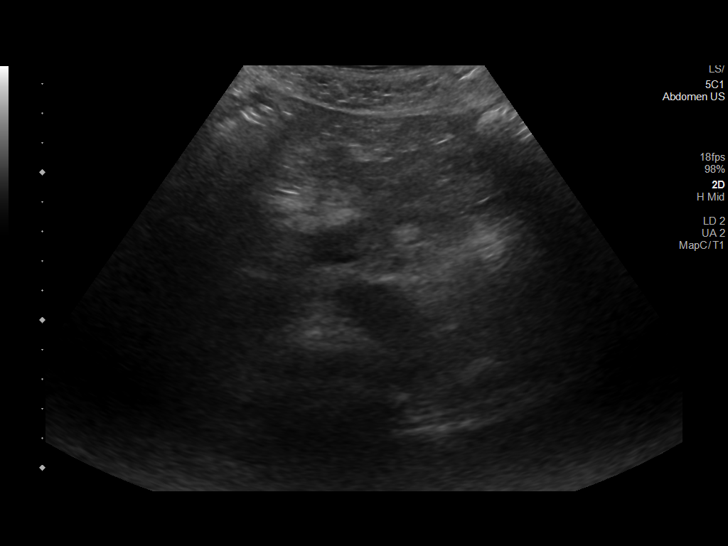
[im 28/28]
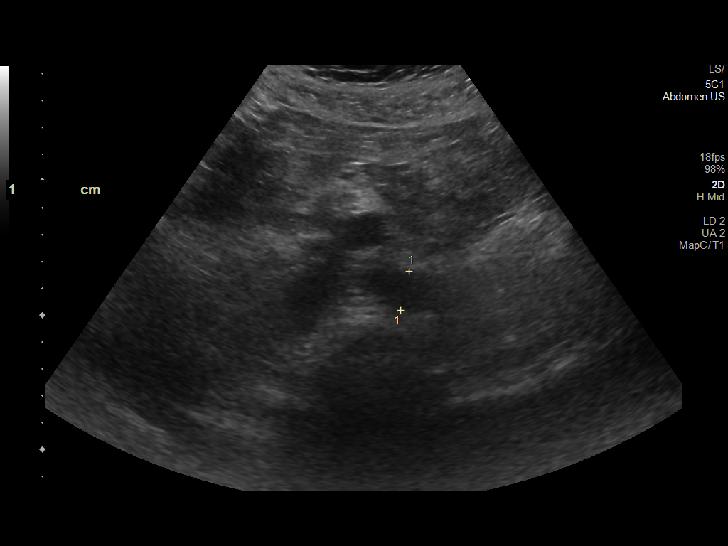

[14 of 25 positions shown; findings below may reference images not displayed]

FINDINGS: Abdominal aortic measurements as follows:

Proximal:  2.4 cm

Mid:  2.6 cm

Distal:  3.7 cm
Patent: Yes, peak systolic velocity is 123 cm/s

Right common iliac artery: 1.5 cm

Left common iliac artery: 1.6 cm
IMPRESSION: Infrarenal abdominal aortic aneurysm 3.7 cm. Recommend follow-up
ultrasound every 2 years. This recommendation follows ACR consensus
guidelines: White Paper of the ACR Incidental Findings Committee II

## 2022-07-18 ENCOUNTER — Encounter: Payer: Self-pay | Admitting: Student

## 2022-10-18 ENCOUNTER — Encounter (HOSPITAL_COMMUNITY): Payer: Self-pay

## 2022-10-18 ENCOUNTER — Emergency Department (HOSPITAL_COMMUNITY): Payer: Medicaid Other

## 2022-10-18 ENCOUNTER — Inpatient Hospital Stay (HOSPITAL_COMMUNITY): Payer: Medicaid Other

## 2022-10-18 ENCOUNTER — Other Ambulatory Visit: Payer: Self-pay

## 2022-10-18 ENCOUNTER — Inpatient Hospital Stay (HOSPITAL_COMMUNITY)
Admission: EM | Admit: 2022-10-18 | Discharge: 2022-10-23 | DRG: 286 | Disposition: A | Discharge status: Home / Self Care | Payer: Medicaid Other | Attending: Internal Medicine | Admitting: Internal Medicine

## 2022-10-18 DIAGNOSIS — I4891 Unspecified atrial fibrillation: Secondary | ICD-10-CM | POA: Diagnosis not present

## 2022-10-18 DIAGNOSIS — Z5986 Financial insecurity: Secondary | ICD-10-CM

## 2022-10-18 DIAGNOSIS — F1010 Alcohol abuse, uncomplicated: Secondary | ICD-10-CM | POA: Diagnosis present

## 2022-10-18 DIAGNOSIS — Z6830 Body mass index (BMI) 30.0-30.9, adult: Secondary | ICD-10-CM

## 2022-10-18 DIAGNOSIS — Z818 Family history of other mental and behavioral disorders: Secondary | ICD-10-CM

## 2022-10-18 DIAGNOSIS — Z7951 Long term (current) use of inhaled steroids: Secondary | ICD-10-CM

## 2022-10-18 DIAGNOSIS — Z8249 Family history of ischemic heart disease and other diseases of the circulatory system: Secondary | ICD-10-CM

## 2022-10-18 DIAGNOSIS — I2722 Pulmonary hypertension due to left heart disease: Secondary | ICD-10-CM | POA: Diagnosis present

## 2022-10-18 DIAGNOSIS — I272 Pulmonary hypertension, unspecified: Secondary | ICD-10-CM

## 2022-10-18 DIAGNOSIS — I428 Other cardiomyopathies: Secondary | ICD-10-CM | POA: Diagnosis present

## 2022-10-18 DIAGNOSIS — I10 Essential (primary) hypertension: Secondary | ICD-10-CM | POA: Diagnosis present

## 2022-10-18 DIAGNOSIS — I5023 Acute on chronic systolic (congestive) heart failure: Secondary | ICD-10-CM | POA: Diagnosis present

## 2022-10-18 DIAGNOSIS — Z83438 Family history of other disorder of lipoprotein metabolism and other lipidemia: Secondary | ICD-10-CM

## 2022-10-18 DIAGNOSIS — Z823 Family history of stroke: Secondary | ICD-10-CM

## 2022-10-18 DIAGNOSIS — Z7902 Long term (current) use of antithrombotics/antiplatelets: Secondary | ICD-10-CM

## 2022-10-18 DIAGNOSIS — I5020 Unspecified systolic (congestive) heart failure: Secondary | ICD-10-CM

## 2022-10-18 DIAGNOSIS — Z91141 Patient's other noncompliance with medication regimen due to financial hardship: Secondary | ICD-10-CM | POA: Diagnosis not present

## 2022-10-18 DIAGNOSIS — I110 Hypertensive heart disease with heart failure: Principal | ICD-10-CM | POA: Diagnosis present

## 2022-10-18 DIAGNOSIS — I7143 Infrarenal abdominal aortic aneurysm, without rupture: Secondary | ICD-10-CM | POA: Diagnosis present

## 2022-10-18 DIAGNOSIS — R590 Localized enlarged lymph nodes: Secondary | ICD-10-CM | POA: Diagnosis not present

## 2022-10-18 DIAGNOSIS — I252 Old myocardial infarction: Secondary | ICD-10-CM

## 2022-10-18 DIAGNOSIS — Z8279 Family history of other congenital malformations, deformations and chromosomal abnormalities: Secondary | ICD-10-CM

## 2022-10-18 DIAGNOSIS — I2510 Atherosclerotic heart disease of native coronary artery without angina pectoris: Secondary | ICD-10-CM | POA: Diagnosis present

## 2022-10-18 DIAGNOSIS — E669 Obesity, unspecified: Secondary | ICD-10-CM | POA: Diagnosis present

## 2022-10-18 DIAGNOSIS — E876 Hypokalemia: Secondary | ICD-10-CM | POA: Diagnosis not present

## 2022-10-18 DIAGNOSIS — Z7982 Long term (current) use of aspirin: Secondary | ICD-10-CM

## 2022-10-18 DIAGNOSIS — Z720 Tobacco use: Secondary | ICD-10-CM | POA: Diagnosis present

## 2022-10-18 DIAGNOSIS — Z833 Family history of diabetes mellitus: Secondary | ICD-10-CM

## 2022-10-18 DIAGNOSIS — Z597 Insufficient social insurance and welfare support: Secondary | ICD-10-CM

## 2022-10-18 DIAGNOSIS — I2489 Other forms of acute ischemic heart disease: Secondary | ICD-10-CM | POA: Diagnosis present

## 2022-10-18 DIAGNOSIS — J439 Emphysema, unspecified: Secondary | ICD-10-CM | POA: Diagnosis present

## 2022-10-18 DIAGNOSIS — Z91148 Patient's other noncompliance with medication regimen for other reason: Secondary | ICD-10-CM

## 2022-10-18 DIAGNOSIS — Z789 Other specified health status: Secondary | ICD-10-CM | POA: Diagnosis present

## 2022-10-18 DIAGNOSIS — I4819 Other persistent atrial fibrillation: Secondary | ICD-10-CM | POA: Diagnosis present

## 2022-10-18 DIAGNOSIS — I509 Heart failure, unspecified: Secondary | ICD-10-CM

## 2022-10-18 DIAGNOSIS — Z79899 Other long term (current) drug therapy: Secondary | ICD-10-CM | POA: Diagnosis not present

## 2022-10-18 DIAGNOSIS — Z800 Family history of malignant neoplasm of digestive organs: Secondary | ICD-10-CM

## 2022-10-18 DIAGNOSIS — Z825 Family history of asthma and other chronic lower respiratory diseases: Secondary | ICD-10-CM

## 2022-10-18 DIAGNOSIS — Z83719 Family history of colon polyps, unspecified: Secondary | ICD-10-CM

## 2022-10-18 DIAGNOSIS — Z808 Family history of malignant neoplasm of other organs or systems: Secondary | ICD-10-CM

## 2022-10-18 DIAGNOSIS — F17210 Nicotine dependence, cigarettes, uncomplicated: Secondary | ICD-10-CM | POA: Diagnosis present

## 2022-10-18 LAB — I-STAT CHEM 8, ED
BUN: 26 mg/dL — ABNORMAL HIGH (ref 8–23)
Calcium, Ion: 1.18 mmol/L (ref 1.15–1.40)
Chloride: 110 mmol/L (ref 98–111)
Creatinine, Ser: 1.4 mg/dL — ABNORMAL HIGH (ref 0.61–1.24)
Glucose, Bld: 107 mg/dL — ABNORMAL HIGH (ref 70–99)
HCT: 40 % (ref 39.0–52.0)
Hemoglobin: 13.6 g/dL (ref 13.0–17.0)
Potassium: 4 mmol/L (ref 3.5–5.1)
Sodium: 143 mmol/L (ref 135–145)
TCO2: 20 mmol/L — ABNORMAL LOW (ref 22–32)

## 2022-10-18 LAB — CBC
HCT: 41 % (ref 39.0–52.0)
Hemoglobin: 13.3 g/dL (ref 13.0–17.0)
MCH: 27 pg (ref 26.0–34.0)
MCHC: 32.4 g/dL (ref 30.0–36.0)
MCV: 83.2 fL (ref 80.0–100.0)
Platelets: 237 10*3/uL (ref 150–400)
RBC: 4.93 MIL/uL (ref 4.22–5.81)
RDW: 13.9 % (ref 11.5–15.5)
WBC: 11.3 10*3/uL — ABNORMAL HIGH (ref 4.0–10.5)
nRBC: 0 % (ref 0.0–0.2)

## 2022-10-18 LAB — HEPATIC FUNCTION PANEL
ALT: 22 U/L (ref 0–44)
AST: 17 U/L (ref 15–41)
Albumin: 3.7 g/dL (ref 3.5–5.0)
Alkaline Phosphatase: 83 U/L (ref 38–126)
Bilirubin, Direct: 0.2 mg/dL (ref 0.0–0.2)
Indirect Bilirubin: 0.6 mg/dL (ref 0.3–0.9)
Total Bilirubin: 0.8 mg/dL (ref 0.3–1.2)
Total Protein: 7.7 g/dL (ref 6.5–8.1)

## 2022-10-18 LAB — LIPID PANEL
Cholesterol: 145 mg/dL (ref 0–200)
HDL: 31 mg/dL — ABNORMAL LOW (ref >40)
LDL Cholesterol: 103 mg/dL — ABNORMAL HIGH (ref 0–99)
Total CHOL/HDL Ratio: 4.7 RATIO
Triglycerides: 54 mg/dL (ref <150)
VLDL: 11 mg/dL (ref 0–40)

## 2022-10-18 LAB — BASIC METABOLIC PANEL
Anion gap: 9 (ref 5–15)
BUN: 25 mg/dL — ABNORMAL HIGH (ref 8–23)
CO2: 18 mmol/L — ABNORMAL LOW (ref 22–32)
Calcium: 8.7 mg/dL — ABNORMAL LOW (ref 8.9–10.3)
Chloride: 111 mmol/L (ref 98–111)
Creatinine, Ser: 1.3 mg/dL — ABNORMAL HIGH (ref 0.61–1.24)
GFR, Estimated: >60 mL/min (ref >60)
Glucose, Bld: 112 mg/dL — ABNORMAL HIGH (ref 70–99)
Potassium: 3.9 mmol/L (ref 3.5–5.1)
Sodium: 138 mmol/L (ref 135–145)

## 2022-10-18 LAB — ECHOCARDIOGRAM COMPLETE
Height: 75 in
S' Lateral: 4.9 cm
Weight: 4179.92 oz

## 2022-10-18 LAB — TROPONIN I (HIGH SENSITIVITY)
Troponin I (High Sensitivity): 42 ng/L — ABNORMAL HIGH (ref <18)
Troponin I (High Sensitivity): 47 ng/L — ABNORMAL HIGH (ref <18)
Troponin I (High Sensitivity): 40 ng/L — ABNORMAL HIGH (ref <18)

## 2022-10-18 LAB — BRAIN NATRIURETIC PEPTIDE: B Natriuretic Peptide: 415.6 pg/mL — ABNORMAL HIGH (ref 0.0–100.0)

## 2022-10-18 LAB — HIV ANTIBODY (ROUTINE TESTING W REFLEX): HIV Screen 4th Generation wRfx: NONREACTIVE

## 2022-10-18 LAB — HEPARIN LEVEL (UNFRACTIONATED)
Heparin Unfractionated: 0.13 IU/mL — ABNORMAL LOW (ref 0.30–0.70)
Heparin Unfractionated: 0.38 IU/mL (ref 0.30–0.70)

## 2022-10-18 LAB — TSH: TSH: 1.425 u[IU]/mL (ref 0.350–4.500)

## 2022-10-18 MED ORDER — METOPROLOL TARTRATE 25 MG PO TABS
25.0000 mg | ORAL_TABLET | Freq: Four times a day (QID) | ORAL | Status: DC
  Administered 2022-10-18: 25 mg via ORAL
  Filled 2022-10-18: qty 1

## 2022-10-18 MED ORDER — ORAL CARE MOUTH RINSE: 15.0000 mL | OROMUCOSAL | Status: DC | PRN

## 2022-10-18 MED ORDER — ATORVASTATIN CALCIUM 80 MG PO TABS
80.0000 mg | ORAL_TABLET | Freq: Every day | ORAL | Status: DC
  Administered 2022-10-18 – 2022-10-23 (×6): 80 mg via ORAL
  Filled 2022-10-18: qty 2
  Filled 2022-10-18 (×5): qty 1

## 2022-10-18 MED ORDER — DILTIAZEM LOAD VIA INFUSION
10.0000 mg | Freq: Once | INTRAVENOUS | Status: AC
  Administered 2022-10-18: 10 mg via INTRAVENOUS
  Filled 2022-10-18: qty 10

## 2022-10-18 MED ORDER — HEPARIN (PORCINE) 25000 UT/250ML-% IV SOLN
2050.0000 [IU]/h | INTRAVENOUS | Status: DC
  Administered 2022-10-18 – 2022-10-20 (×5): 2050 [IU]/h via INTRAVENOUS
  Filled 2022-10-18 (×4): qty 250

## 2022-10-18 MED ORDER — DAPAGLIFLOZIN PROPANEDIOL 5 MG PO TABS
5.0000 mg | ORAL_TABLET | Freq: Every day | ORAL | Status: DC
  Administered 2022-10-18 – 2022-10-19 (×2): 5 mg via ORAL
  Filled 2022-10-18 (×3): qty 1

## 2022-10-18 MED ORDER — FUROSEMIDE 10 MG/ML IJ SOLN
INTRAMUSCULAR | Status: AC
  Filled 2022-10-18: qty 6

## 2022-10-18 MED ORDER — IOHEXOL 350 MG/ML SOLN
75.0000 mL | Freq: Once | INTRAVENOUS | Status: AC | PRN
  Administered 2022-10-18: 75 mL via INTRAVENOUS

## 2022-10-18 MED ORDER — HEPARIN (PORCINE) 25000 UT/250ML-% IV SOLN
1600.0000 [IU]/h | INTRAVENOUS | Status: DC
  Administered 2022-10-18: 1600 [IU]/h via INTRAVENOUS
  Filled 2022-10-18: qty 250

## 2022-10-18 MED ORDER — HEPARIN BOLUS VIA INFUSION
3300.0000 [IU] | Freq: Once | INTRAVENOUS | Status: AC
  Administered 2022-10-18: 3300 [IU] via INTRAVENOUS
  Filled 2022-10-18: qty 3300

## 2022-10-18 MED ORDER — ASPIRIN 81 MG PO TBEC
81.0000 mg | DELAYED_RELEASE_TABLET | Freq: Every day | ORAL | Status: DC
  Administered 2022-10-18 – 2022-10-19 (×2): 81 mg via ORAL
  Filled 2022-10-18 (×2): qty 1

## 2022-10-18 MED ORDER — ISOSORBIDE MONONITRATE ER 60 MG PO TB24
60.0000 mg | ORAL_TABLET | Freq: Every day | ORAL | Status: DC
  Administered 2022-10-18 – 2022-10-23 (×6): 60 mg via ORAL
  Filled 2022-10-18 (×2): qty 1
  Filled 2022-10-18: qty 2
  Filled 2022-10-18 (×3): qty 1

## 2022-10-18 MED ORDER — AMLODIPINE BESYLATE 5 MG PO TABS
5.0000 mg | ORAL_TABLET | Freq: Every day | ORAL | Status: DC
  Administered 2022-10-18 – 2022-10-21 (×4): 5 mg via ORAL
  Filled 2022-10-18 (×4): qty 1

## 2022-10-18 MED ORDER — SACUBITRIL-VALSARTAN 97-103 MG PO TABS
1.0000 | ORAL_TABLET | Freq: Two times a day (BID) | ORAL | Status: DC
  Administered 2022-10-18 – 2022-10-19 (×3): 1 via ORAL
  Filled 2022-10-18 (×5): qty 1

## 2022-10-18 MED ORDER — HYDROXYZINE HCL 25 MG PO TABS
25.0000 mg | ORAL_TABLET | Freq: Three times a day (TID) | ORAL | Status: DC | PRN
  Administered 2022-10-18 – 2022-10-22 (×4): 25 mg via ORAL
  Filled 2022-10-18 (×4): qty 1

## 2022-10-18 MED ORDER — FUROSEMIDE 10 MG/ML IJ SOLN
40.0000 mg | Freq: Two times a day (BID) | INTRAMUSCULAR | Status: DC
  Administered 2022-10-18 – 2022-10-19 (×3): 40 mg via INTRAVENOUS
  Filled 2022-10-18 (×3): qty 4

## 2022-10-18 MED ORDER — ACETAMINOPHEN 325 MG PO TABS
650.0000 mg | ORAL_TABLET | Freq: Four times a day (QID) | ORAL | Status: DC | PRN
  Administered 2022-10-18 – 2022-10-19 (×2): 650 mg via ORAL
  Filled 2022-10-18 (×3): qty 2

## 2022-10-18 MED ORDER — POLYETHYLENE GLYCOL 3350 17 G PO PACK: 17.0000 g | PACK | Freq: Every day | ORAL | Status: DC | PRN

## 2022-10-18 MED ORDER — ACETAMINOPHEN 650 MG RE SUPP: 650.0000 mg | Freq: Four times a day (QID) | RECTAL | Status: DC | PRN

## 2022-10-18 MED ORDER — DILTIAZEM HCL-DEXTROSE 125-5 MG/125ML-% IV SOLN (PREMIX)
5.0000 mg/h | INTRAVENOUS | Status: DC
  Administered 2022-10-18: 5 mg/h via INTRAVENOUS
  Filled 2022-10-18: qty 125

## 2022-10-18 MED ORDER — HEPARIN BOLUS VIA INFUSION
5000.0000 [IU] | Freq: Once | INTRAVENOUS | Status: AC
  Administered 2022-10-18: 5000 [IU] via INTRAVENOUS
  Filled 2022-10-18: qty 5000

## 2022-10-18 MED ORDER — FUROSEMIDE 10 MG/ML IJ SOLN
60.0000 mg | Freq: Once | INTRAMUSCULAR | Status: AC
  Administered 2022-10-18: 60 mg via INTRAVENOUS

## 2022-10-18 NOTE — ED Notes (Signed)
Hospitalist at bedside 

## 2022-10-18 NOTE — Progress Notes (Signed)
Pt is transferred from ED to 4E06. He is alert, fully oriented x 4, able to ambulate in room with one standby assisted. HR 80s-100 at rest and 130-140 at ambulating with some level of dyspnea with exertion. On 3 LPM of O2 NCL, BP stable 118/89 mmHg, SPO2 97%, temp 97.7 F.   Dr. Hassan Rowan and Dr. August Saucer were notified. Pt has a complaint of tolerable chest tightness, but no significant chest pain or acute distress at arrival. Pt is on Heparin gtt at 2050 units/hr. We will continue to monitor.   Filiberto Pinks, RN  .

## 2022-10-18 NOTE — ED Notes (Signed)
ED TO INPATIENT HANDOFF REPORT  ED Nurse Name and Phone #: [phone]  S Name/Age/Gender Michael Bell 62 y.o. male Room/Bed: 001C/001C  Code Status   Code Status: Full Code  Home/SNF/Other Home Patient oriented to: self, place, time, and situation Is this baseline? Yes   Triage Complete: Triage complete  Chief Complaint Atrial fibrillation with RVR (HCC) [I48.91]  Triage Note Pt arrived from home via POV c/o centralized chest pain radiating down left arm with SOB. Pt describes it as pressure on his chest. Pt is tachycardic and tachypnea in triage. Labored. Irregular heart beat   Allergies No Known Allergies  Level of Care/Admitting Diagnosis ED Disposition     ED Disposition  Admit   Condition  --   Comment  Hospital Area: [facility] [[id number]]  Level of Care: Progressive [102]  Admit to Progressive based on following criteria: CARDIOVASCULAR & THORACIC of moderate stability with acute coronary syndrome symptoms/low risk myocardial infarction/hypertensive urgency/arrhythmias/heart failure potentially compromising stability and stable post cardiovascular intervention patients.  May admit patient to [facility] or [facility] if equivalent level of care is available:: No  Covid Evaluation: Asymptomatic - no recent exposure (last 10 days) testing not required  Diagnosis: Atrial fibrillation with RVR [facility]) [[id number]]  Admitting Physician: Dickie La [[id number]]  Attending Physician: Dickie La [[id number]]  Certification:: I certify this patient will need inpatient services for at least 2 midnights  Estimated Length of Stay: 2          B Medical/Surgery History Past Medical History:  Diagnosis Date   Acute diastolic heart failure (HCC)    Arthritis    CHF (congestive heart failure) (HCC)    Headache    Hypertension    Hypertensive urgency    Past Surgical History:  Procedure Laterality Date   BACK SURGERY     LUMBAR LAMINECTOMY N/A 01/03/2014    Procedure: RIGHT EXTRAFORAMINAL APPROACH TO 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;  Surgeon: Kerrin Champagne, MD;  Location: [facility] OR;  Service: Orthopedics;  Laterality: N/A;   NOSE SURGERY     40 yrs ago   RIGHT/LEFT HEART CATH AND CORONARY ANGIOGRAPHY N/A 08/05/2021   Procedure: RIGHT/LEFT HEART CATH AND CORONARY ANGIOGRAPHY;  Surgeon: Yates Decamp, MD;  Location: [facility] INVASIVE CV LAB;  Service: Cardiovascular;  Laterality: N/A;     A IV Location/Drains/Wounds Patient Lines/Drains/Airways Status     Active Line/Drains/Airways     Name Placement date Placement time Site Days   Peripheral IV 10/18/22 20 G Left Antecubital 10/18/22  0127  Antecubital  less than 1   Peripheral IV 10/18/22 20 G Right Antecubital 10/18/22  0322  Antecubital  less than 1   Incision (Closed) 01/03/14 Back 01/03/14  1618  -- 3210            Intake/Output Last 24 hours  Intake/Output Summary (Last 24 hours) at 10/18/2022 1923 Last data filed at 10/18/2022 1917 Gross per 24 hour  Intake --  Output 3675 ml  Net -3675 ml    Labs/Imaging Results for orders placed or performed during the hospital encounter of 10/18/22 (from the past 48 hour(s))  Basic metabolic panel     Status: Abnormal   Collection Time: 10/18/22  1:16 AM  Result Value Ref Range   Sodium 138 135 - 145 mmol/L   Potassium 3.9 3.5 - 5.1 mmol/L   Chloride 111 98 - 111 mmol/L   CO2 18 (L) 22 - 32 mmol/L   Glucose,  Bld 112 (H) 70 - 99 mg/dL    Comment: Glucose reference range applies only to samples taken after fasting for at least 8 hours.   BUN 25 (H) 8 - 23 mg/dL   Creatinine, Ser 3.08 (H) 0.61 - 1.24 mg/dL   Calcium 8.7 (L) 8.9 - 10.3 mg/dL   GFR, Estimated >65 >78 mL/min    Comment: (NOTE) Calculated using the CKD-EPI Creatinine Equation (2021)    Anion gap 9 5 - 15    Comment: Performed at [facility], [address] [place], [place] [address]  CBC     Status: Abnormal   Collection Time: 10/18/22  1:16 AM   Result Value Ref Range   WBC 11.3 (H) 4.0 - 10.5 K/uL   RBC 4.93 4.22 - 5.81 MIL/uL   Hemoglobin 13.3 13.0 - 17.0 g/dL   HCT 95.2 84.1 - 32.4 %   MCV 83.2 80.0 - 100.0 fL   MCH 27.0 26.0 - 34.0 pg   MCHC 32.4 30.0 - 36.0 g/dL   RDW 40.1 02.7 - 25.3 %   Platelets 237 150 - 400 K/uL   nRBC 0.0 0.0 - 0.2 %    Comment: Performed at [facility], [address], [place], [place] [address]  Troponin I (High Sensitivity)     Status: Abnormal   Collection Time: 10/18/22  1:16 AM  Result Value Ref Range   Troponin I (High Sensitivity) 42 (H) <18 ng/L    Comment: (NOTE) Elevated high sensitivity troponin I (hsTnI) values and significant  changes across serial measurements may suggest ACS but many other  chronic and acute conditions are known to elevate hsTnI results.  Refer to the "Links" section for chest pain algorithms and additional  guidance. Performed at [facility], [address], [place], [place] [address]   TSH     Status: None   Collection Time: 10/18/22  1:16 AM  Result Value Ref Range   TSH 1.425 0.350 - 4.500 uIU/mL    Comment: Performed by a 3rd Generation assay with a functional sensitivity of <=0.01 uIU/mL. Performed at [facility], [address], [place], [place] [address]   Brain natriuretic peptide     Status: Abnormal   Collection Time: 10/18/22  1:29 AM  Result Value Ref Range   B Natriuretic Peptide 415.6 (H) 0.0 - 100.0 pg/mL    Comment: Performed at [facility], [address], [place], [place] [address]  I-stat chem 8, ED (not at [facility], [facility] or [facility])     Status: Abnormal   Collection Time: 10/18/22  1:39 AM  Result Value Ref Range   Sodium 143 135 - 145 mmol/L   Potassium 4.0 3.5 - 5.1 mmol/L   Chloride 110 98 - 111 mmol/L   BUN 26 (H) 8 - 23 mg/dL   Creatinine, Ser 3.32 (H) 0.61 - 1.24 mg/dL   Glucose, Bld 951 (H) 70 - 99 mg/dL    Comment: Glucose reference range applies only to samples taken after fasting for at least 8 hours.    Calcium, Ion 1.18 1.15 - 1.40 mmol/L   TCO2 20 (L) 22 - 32 mmol/L   Hemoglobin 13.6 13.0 - 17.0 g/dL   HCT 88.4 16.6 - 06.3 %  Troponin I (High Sensitivity)     Status: Abnormal   Collection Time: 10/18/22  3:20 AM  Result Value Ref Range   Troponin I (High Sensitivity) 47 (H) <18 ng/L  Comment: (NOTE) Elevated high sensitivity troponin I (hsTnI) values and significant  changes across serial measurements may suggest ACS but many other  chronic and acute conditions are known to elevate hsTnI results.  Refer to the "Links" section for chest pain algorithms and additional  guidance. Performed at [facility], [address] [place], [place] [address]   HIV Antibody (routine testing w rflx)     Status: None   Collection Time: 10/18/22  4:45 AM  Result Value Ref Range   HIV Screen 4th Generation wRfx Non Reactive Non Reactive    Comment: Performed at [facility] Lab, [address], [place], [place] [address]  Lipid panel     Status: Abnormal   Collection Time: 10/18/22  4:45 AM  Result Value Ref Range   Cholesterol 145 0 - 200 mg/dL   Triglycerides 54 <295 mg/dL   HDL 31 (L) >28 mg/dL   Total CHOL/HDL Ratio 4.7 RATIO   VLDL 11 0 - 40 mg/dL   LDL Cholesterol 413 (H) 0 - 99 mg/dL    Comment:        Total Cholesterol/HDL:CHD Risk Coronary Heart Disease Risk Table                     Men   Women  1/2 Average Risk   3.4   3.3  Average Risk       5.0   4.4  2 X Average Risk   9.6   7.1  3 X Average Risk  23.4   11.0        Use the calculated Patient Ratio above and the CHD Risk Table to determine the patient's CHD Risk.        ATP III CLASSIFICATION (LDL):  <100     mg/dL   Optimal  244-010  mg/dL   Near or Above                    Optimal  130-159  mg/dL   Borderline  272-536  mg/dL   High  >644     mg/dL   Very High Performed at [facility], [address] [place], [place] [address]   Hepatic function panel     Status: None   Collection Time:  10/18/22  4:45 AM  Result Value Ref Range   Total Protein 7.7 6.5 - 8.1 g/dL   Albumin 3.7 3.5 - 5.0 g/dL   AST 17 15 - 41 U/L   ALT 22 0 - 44 U/L   Alkaline Phosphatase 83 38 - 126 U/L   Total Bilirubin 0.8 0.3 - 1.2 mg/dL   Bilirubin, Direct 0.2 0.0 - 0.2 mg/dL   Indirect Bilirubin 0.6 0.3 - 0.9 mg/dL    Comment: Performed at [facility], [address], [place], [place] [address]  Heparin level (unfractionated)     Status: Abnormal   Collection Time: 10/18/22  9:48 AM  Result Value Ref Range   Heparin Unfractionated 0.13 (L) 0.30 - 0.70 IU/mL    Comment: (NOTE) The clinical reportable range upper limit is being lowered to >1.10 to align with the FDA approved guidance for the current laboratory assay.  If heparin results are below expected values, and patient dosage has  been confirmed, suggest follow up testing of antithrombin III levels. Performed at [facility], [address], [place], [place] [address]   Troponin I (High Sensitivity)  Status: Abnormal   Collection Time: 10/18/22  9:48 AM  Result Value Ref Range   Troponin I (High Sensitivity) 40 (H) <18 ng/L    Comment: (NOTE) Elevated high sensitivity troponin I (hsTnI) values and significant  changes across serial measurements may suggest ACS but many other  chronic and acute conditions are known to elevate hsTnI results.  Refer to the "Links" section for chest pain algorithms and additional  guidance. Performed at [facility], [address] [place], [place] [address]   Heparin level (unfractionated)     Status: None   Collection Time: 10/18/22  5:50 PM  Result Value Ref Range   Heparin Unfractionated 0.38 0.30 - 0.70 IU/mL    Comment: (NOTE) The clinical reportable range upper limit is being lowered to >1.10 to align with the FDA approved guidance for the current laboratory assay.  If heparin results are below expected values, and patient dosage has  been confirmed, suggest follow up  testing of antithrombin III levels. Performed at [facility], [address] [place], [place] [address]    ECHOCARDIOGRAM COMPLETE  Result Date: 10/18/2022    ECHOCARDIOGRAM REPORT   Patient Name:   Michael Bell Date of Exam: 10/18/2022 Medical Rec #:  [id number]    Height:       75.0 in Accession #:    [id number]   Weight:       261.2 lb Date of Birth:  1960-12-08    BSA:          2.458 m Patient Age:    62 years     BP:           161/82 mmHg Patient Gender: M            HR:           92 bpm. Exam Location:  Inpatient Procedure: Cardiac Doppler, Color Doppler and 2D Echo Indications:     A fib  History:         Patient has prior history of Echocardiogram examinations, most                  recent 08/05/2021. Risk Factors:Hypertension and Current Smoker.  Sonographer:     Delcie Roch RDCS Referring Phys:  [id number] GRACE LAU Diagnosing Phys: Thurmon Fair MD IMPRESSIONS  1. Left ventricular ejection fraction, by estimation, is 35 to 40%. The left ventricle has moderately decreased function. The left ventricle demonstrates global hypokinesis. The left ventricular internal cavity size was mildly dilated. There is mild eccentric left ventricular hypertrophy. Left ventricular diastolic function could not be evaluated.  2. Right ventricular systolic function is normal. The right ventricular size is mildly enlarged. Tricuspid regurgitation signal is inadequate for assessing PA pressure.  3. Left atrial size was severely dilated.  4. Right atrial size was severely dilated.  5. The mitral valve is normal in structure. No evidence of mitral valve regurgitation.  6. The aortic valve is tricuspid. There is mild calcification of the aortic valve. Aortic valve regurgitation is not visualized. Aortic valve sclerosis is present, with no evidence of aortic valve stenosis.  7. The inferior vena cava is dilated in size with >50% respiratory variability, suggesting right atrial pressure of 8 mmHg. Comparison(s): No  significant change from prior study. FINDINGS  Left Ventricle: Left ventricular ejection fraction, by estimation, is 35 to 40%. The left ventricle has moderately decreased function. The left ventricle demonstrates global hypokinesis. The left ventricular internal cavity size was  mildly dilated. There is mild eccentric left ventricular hypertrophy. Left ventricular diastolic function could not be evaluated due to nondiagnostic images. Left ventricular diastolic function could not be evaluated. Right Ventricle: The right ventricular size is mildly enlarged. No increase in right ventricular wall thickness. Right ventricular systolic function is normal. Tricuspid regurgitation signal is inadequate for assessing PA pressure. Left Atrium: Left atrial size was severely dilated. Right Atrium: Right atrial size was severely dilated. Pericardium: There is no evidence of pericardial effusion. Mitral Valve: The mitral valve is normal in structure. No evidence of mitral valve regurgitation. Tricuspid Valve: The tricuspid valve is normal in structure. Tricuspid valve regurgitation is not demonstrated. Aortic Valve: The aortic valve is tricuspid. There is mild calcification of the aortic valve. Aortic valve regurgitation is not visualized. Aortic valve sclerosis is present, with no evidence of aortic valve stenosis. Pulmonic Valve: The pulmonic valve was normal in structure. Pulmonic valve regurgitation is not visualized. No evidence of pulmonic stenosis. Aorta: The aortic root and ascending aorta are structurally normal, with no evidence of dilitation. Venous: The inferior vena cava is dilated in size with greater than 50% respiratory variability, suggesting right atrial pressure of 8 mmHg. IAS/Shunts: No atrial level shunt detected by color flow Doppler.  LEFT VENTRICLE PLAX 2D LVIDd:         6.25 cm LVIDs:         4.90 cm LV PW:         1.35 cm LV IVS:        1.25 cm LVOT diam:     2.40 cm LV SV:         80 LV SV Index:   32  LVOT Area:     4.52 cm  RIGHT VENTRICLE          IVC RV Basal diam:  4.10 cm  IVC diam: 2.70 cm TAPSE (M-mode): 1.7 cm LEFT ATRIUM              Index        RIGHT ATRIUM           Index LA diam:        5.80 cm  2.36 cm/m   RA Area:     31.50 cm LA Vol (A2C):   133.0 ml 54.10 ml/m  RA Volume:   114.00 ml 46.38 ml/m LA Vol (A4C):   118.0 ml 48.00 ml/m LA Biplane Vol: 126.0 ml 51.26 ml/m  AORTIC VALVE LVOT Vmax:   101.00 cm/s LVOT Vmean:  61.200 cm/s LVOT VTI:    0.176 m  AORTA Ao Root diam: 3.10 cm Ao Asc diam:  3.80 cm  SHUNTS Systemic VTI:  0.18 m Systemic Diam: 2.40 cm Rachelle Hora Croitoru MD Electronically signed by Thurmon Fair MD Signature Date/Time: 10/18/2022/3:42:24 PM    Final (Updated)    CT Angio Chest PE W and/or Wo Contrast  Result Date: 10/18/2022 CLINICAL DATA:  Pulmonary embolism (PE) suspected, high prob. Chest pain EXAM: CT ANGIOGRAPHY CHEST WITH CONTRAST TECHNIQUE: Multidetector CT imaging of the chest was performed using the standard protocol during bolus administration of intravenous contrast. Multiplanar CT image reconstructions and MIPs were obtained to evaluate the vascular anatomy. RADIATION DOSE REDUCTION: This exam was performed according to the departmental dose-optimization program which includes automated exposure control, adjustment of the mA and/or kV according to patient size and/or use of iterative reconstruction technique. CONTRAST:  75mL OMNIPAQUE IOHEXOL 350 MG/ML SOLN COMPARISON:  01/11/2021 FINDINGS: Cardiovascular: No filling defects in the pulmonary  arteries to suggest pulmonary emboli. Cardiomegaly. Scattered coronary artery and aortic calcifications. No evidence of aortic aneurysm. Mediastinum/Nodes: Enlarging right paratracheal lymph node with a short axis diameter of 3 cm compared to 1.8 cm previously. New subcarinal adenopathy with a short axis diameter of 1.7 cm. AP window and prevascular lymph nodes borderline or mildly enlarged. Mildly enlarged right hilar lymph  node with a short axis diameter of 1.6 cm, new since prior study. Borderline left hilar lymph node with a short axis diameter of 1.2 cm. Trachea and esophagus are unremarkable. Thyroid unremarkable. Lungs/Pleura: Centrilobular and paraseptal emphysema. Small bilateral pleural effusions. Compressive atelectasis in the lower lobes. Mild vascular congestion. Upper Abdomen: No acute findings in the upper abdomen. Musculoskeletal: Chest wall soft tissues are unremarkable. No acute bony abnormality. Review of the MIP images confirms the above findings. IMPRESSION: No evidence of pulmonary embolus. Enlarging mediastinal and bilateral hilar lymph nodes. Cannot exclude metastasis or lymphoma. Consider further evaluation with PET CT. Small bilateral pleural effusions.  Bibasilar atelectasis. Cardiomegaly, vascular congestion. Coronary artery disease. Aortic Atherosclerosis (ICD10-I70.0). Electronically Signed   By: Charlett Nose M.D.   On: 10/18/2022 02:23    Pending Labs Unresulted Labs (From admission, onward)     Start     Ordered   10/19/22 0500  Heparin level (unfractionated)  Tomorrow morning,   R        10/18/22 1142   10/19/22 0500  CBC with Differential/Platelet  Add-on,   AD        10/18/22 1143   10/19/22 0500  Basic metabolic panel  Tomorrow morning,   R        10/18/22 1325   10/19/22 0000  Heparin level (unfractionated)  Once-Timed,   TIMED        10/18/22 1910   10/18/22 1112  Differential  Add-on,   AD        10/18/22 1111            Vitals/Pain Today's Vitals   10/18/22 1730 10/18/22 1740 10/18/22 1845 10/18/22 1913  BP:  136/87 (!) 140/115   Pulse: (!) 52 (!) 106 (!) 57   Resp: (!) 24 (!) 24 (!) 23   Temp:  97.7 F (36.5 C)    TempSrc:      SpO2: (!) 88% 92% 95%   Weight:      Height:      PainSc:    3     Isolation Precautions No active isolations  Medications Medications  acetaminophen (TYLENOL) tablet 650 mg (650 mg Oral Given 10/18/22 1739)    Or  acetaminophen  (TYLENOL) suppository 650 mg ( Rectal See Alternative 10/18/22 1739)  polyethylene glycol (MIRALAX / GLYCOLAX) packet 17 g (has no administration in time range)  furosemide (LASIX) injection 40 mg (40 mg Intravenous Given 10/18/22 1747)  atorvastatin (LIPITOR) tablet 80 mg (80 mg Oral Given 10/18/22 0940)  aspirin EC tablet 81 mg (81 mg Oral Given 10/18/22 0941)  sacubitril-valsartan (ENTRESTO) 97-103 mg per tablet (1 tablet Oral Given 10/18/22 0941)  isosorbide mononitrate (IMDUR) 24 hr tablet 60 mg (60 mg Oral Given 10/18/22 1102)  amLODipine (NORVASC) tablet 5 mg (5 mg Oral Given 10/18/22 1102)  dapagliflozin propanediol (FARXIGA) tablet 5 mg (5 mg Oral Given 10/18/22 1103)  heparin ADULT infusion 100 units/mL (25000 units/231mL) (2,050 Units/hr Intravenous New Bag/Given 10/18/22 1355)  hydrOXYzine (ATARAX) tablet 25 mg (25 mg Oral Given 10/18/22 1738)  diltiazem (CARDIZEM) 1 mg/mL load via infusion 10 mg (10 mg  Intravenous Bolus from Bag 10/18/22 0132)  iohexol (OMNIPAQUE) 350 MG/ML injection 75 mL (75 mLs Intravenous Contrast Given 10/18/22 0214)  furosemide (LASIX) injection 60 mg ( Intravenous Canceled Entry 10/18/22 0322)  heparin bolus via infusion 5,000 Units (5,000 Units Intravenous Bolus from Bag 10/18/22 0409)  heparin bolus via infusion 3,300 Units (3,300 Units Intravenous Bolus from Bag 10/18/22 1140)    Mobility walks     Focused Assessments Cardiac Assessment Handoff:  Cardiac Rhythm: Atrial fibrillation Lab Results  Component Value Date   TROPONINI <0.30 10/07/2013   No results found for: "DDIMER" Does the Patient currently have chest pain? No    R Recommendations: See Admitting Provider Note  Report given to:   Additional Notes:  Pt A/Ox4, pt was on cardizem dc then on oral metoprolol and cards was supposed to decide if he was going to go on amiodarone however no new orders. Pt is on 92-94% 3L [place]. He is on heparin at 20.5 and will continue with this rate per pharmacist.   Pt uses a urinal at bedside and output has been documented.

## 2022-10-18 NOTE — Hospital Course (Addendum)
[ ]  PCP: Smoking cessation couseling  [ ]  Cardiology: Follow-up ultrasound for reported infrarenal abdominal aortic aneurysm 3.7 cm on U/S on 07/2021. Cardioversion for persistent Afib. Office < 1 week to 10 days after discharge    [ ]  Pulmonology: Had bilateral hilar LAD on initial CT chest. Recommended PET scan  Scheduled fu with Dr. Delton Coombes outpatient in 4-6 weeks, likely requires EBUS with biopsy. Outpatient PFTs and sleep study    Mr. Michael Bell is a 62 yo M with pertinent PMHx of NSTEMI, HFrEF (EF 40-45%) in 2023, HTN, and tobacco and alcohol use who presented with chest pain and SOB found to have new onset Afib with RVR and acute on chronic heart failure exacerbation. He was previously taking his home medications but since January 2024 has not been able to due to financial hardships. He endorsed smoking 2 ppd and drinking up to 6 beers per day but denied any alcohol use in the last week and withdrawal symptoms. Initial heart rates were elevated to the 140s and he initially started on dilitazem drip. Serial EKGs showed conversion to NSR but he later remitted into atrial fibrillation. He was then started on an amiodarone infusion ***. A heart catheterization was performed on 7/25 that showed distal occlusion at LCX consistent with prior cath on 07/2021 and RCA with diffuse 80-90% mid-vessel disease. He was found to elevated pulmonary pressures consistent with pulmonary hypertension along with a low cardiac index***.     In regards to his acute on chronic HF, his home GDMT was gradually resumed with IV lasix 40mg , Entresto 97-103mg , and Metoprolol succinate 25mg  ***. Sherryll Burger was temporarily held during the course of admission after he had an elevated creatinine then restarted at a lower dose ***    His home medications of atorvastatin 80mg  daily and aspirin 81mg  daily were resumed. Plavix was held during admission due to high risk of bleeding after being started on hep gtt. ***    #Afib w/ RVR, improving   CHADSVAC score: 3 (HTN, NSTEMI, HFrEF). Possible etiologies include substance use history (ETOH, tobacco), uncontrolled hypertension, and concomitant uncontrolled HF. Given rates are controlled, will defer TEE cardioversion -Cardiology following, appreciate recs as discussed below:  -Wean off IV amiodarone infusion and start PO amiodarone 200mg  BID -Discontinue hep gtt  -Start Eliquis 5mg  BID  -Continue metoprolol succinate 25mg  daily    #Acute on Chronic Heart Failure Exacerbation (EF 35-40%) #CHF NYHA class III-IV #Pulmonary hypertension  Likely 2/2 to medication non-adherence over the last 6 months due to financial hardship, uncontrolled HTN, and new onset Atrial fib with RVR. Prior ECHO done 08/05/2021 showed EF 40-45% with mildly reduced LV wall function. Repeat ECHO showed reducing EF 35-40% with elevated RA pressures and biatrial dilatation (7/23). LHC/RHC on 7/25 showed distal occlusion at LCX c/w prior cath on 07/2021 and RCA with diffuse 80-90% mid-vessel disease. Pulmonary hypertension noted with CO 4.9 and CI 2.0, pressures: PA 37/77mmHg, mPAP , PCWP    -See cardiology recs below: -Cont IV Lasix 40mg  daily -Continue GDMT as follows: -Hold Entresto 97-103mg  BID iso rising Cr. Consider restarting at lower dose 7/26 pending blood pressures  -Increase Farxiga to 10mg  daily  -Cont metoprolol succinate 25mg  daily  -Cont Imdur 60mg  daily -Strict I/Os, daily weights -Mg level -PT eval    #Elevated tropnonin  H/o CAD #C/f Angina LDL 103, has not taken home medications. Troponins: 42 --> 47 --> 40. Initial EKG showed Afib with some ST segment and T wave abnormalities c/f ischemia.  Cath on 08/05/21 showed PDA occlusion and ulceration and up to 90% stenosis of the RCA which was medically managed. Suspect elevated troponins 2/2 non-adherence with medications and acute HF exacerbation. LHC/RHC performed on 7/25, see above for further details.   -Cont atorvastatin 80 mg  daily -Cont bASA -Cont amlodipine 5mg  daily    #Enlarging hilar LAD  CT angio chest PE showed no evidence of an embolus but rather an enlarging mediastinal and bilateral hilar lymph nodes. Cannot exclude metastatic disease or lymphoma. Patient is lifelong tobacco user and not up to date on any of his cancer screenings with extensive family history of cancer. No B symptoms. Previously required 3L [place] but now on RA with normal sats. Consulted pulmonary for further management  -Pulmonary following, appreciate recs, recommended PET scan outpatient; scheduled fu with Dr. Delton Coombes outpatient in 4-6 weeks, likely requires EBUS with biopsy  -Recommended to start Anoro Ellipita and PRN Duonebs  -Will need outpatient PFTs and sleep study    #Hypokalemia, resolved  Potassium WNL -Daily BMP    #Tobacco use disorder Not currently on outpatient therapy. Continues to smoke. Discuss therapies and started on nicotine patch    -Encourage smoking cessation -Continue nicotine patch 21mg     #Uncontrolled HTN  Has been unable to take home medications due to financial difficulties. -Continue antihypertensives as stated above     #H/o AAA Reported infrarenal abdominal aortic aneurysm 3.7 cm on U/S on 07/2021. Recommend follow-up ultrasound every 2 years -Outpatient follow-up    #Social determinants of health Patient is uninsured, working with case management/social work on this, as no insurance will be a barrier to outpatient tests/follow up/

## 2022-10-18 NOTE — Progress Notes (Signed)
PT Cancellation Note  Patient Details Name: Michael Bell MRN: [id number] DOB: 12/30/1960   Cancelled Treatment:    Reason Eval/Treat Not Completed: Medical issues which prohibited therapy - HR in the 120s at rest, RN request PT hold until tomorrow. Will check back.  Marye Round, PT DPT Acute [facility] Services Secure Chat Preferred  Office [phone]    Truddie Coco 10/18/2022, 4:15 PM

## 2022-10-18 NOTE — H&P (Addendum)
Date: 10/18/2022               Patient Name:  Michael Bell MRN: [id number]  DOB: 1960-07-01 Age / Sex: 62 y.o., male   PCP: Marrianne Mood, MD         Medical Service: Internal Medicine Teaching Service         Attending Physician: Dr. Dickie La, MD      First Contact: Sharlyne Pacas MS IV  Pager [phone]    Second Contact: Dr. Morene Crocker, MD Pager [phone]         After Hours (After 5p/  First Contact Pager: [phone]  weekends / holidays): Second Contact Pager: [phone]   SUBJECTIVE   Chief Complaint: Worsening SOB  History of Present Illness:   Michael Bell is a 62 y.o. with a PMH significant for NSTEMI (occluded PDA) 1.5 years ago medically management recommended, CHF, HA, HTN, and Hypertensive urgency. Comes in today with SOB that started 2 days ago, feels like an elephant is on his chest and sometimes will feel sharp, was started on medicine when he got the MI but has not been able to afford it this past year. He says he signed up for Social Security and pending his insurance. Reports a subjective fever and productive cough. Cannot describe his sputum. Can't talk without catching his breath and can't lay flat for the last 4-5 days, has been vomiting for the same amount of time. Endorses chest palpitations and his heart racing about 4 days ago. Over 1.5 week feels like stomach is "full as a tick" and last year during exacerbation he held fluid in his abdomen as well. Not up to date on cancer screenings.    ED Course:  At ED his HR was in Afib with RVR, given dilt and heparin drip. Lasix for diuresis. Remained SOB but started having output 2/2 lasix, HR in the 100s during the time of this interview. Pertinent labs:   BNP 415.6 Troponin 42  Cr 1.3 BUN 26   TCO2 20  CO2 18 AG WNL   Glu 112 WBC 11.3  CTA negative for PE, CXR was concerning for bilateral hilar LAD could not exclude mets or lymphoma. Small bilateral pleural effusions, bibasilar atelectasis,  cardiomegaly, vascular congestion, CAD, Aortic atherosclerosis.   Admitted to our team for further management and workup.   Meds:  No outpatient medications have been marked as taking for the 10/18/22 encounter [facility] Encounter).    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 Independent in adls, iadls. Used to work, hasn't in the last 1-2 weeks due to symptoms (shob). Works in [employer]. Drinks beer 6 pack daily most days but hasn't had any in last 1-2 weeks. Smokes tobacco 1.5 ppd x 45y. Smokes marijuana, very little, no other illicit substances. Uses marijuana for pain.   Family History:  Family History  Problem Relation Age of Onset   Ovarian cancer Mother 50  primary peritoneal cancer   Cancer Mother    Diabetes Mother    Heart disease Mother    Hyperlipidemia Mother    Hypertension Mother    Stroke Mother    Mental illness Mother    Colon polyps Father        40 on last colonoscopy   Heart disease Father    Hyperlipidemia Father    Hypertension Father    Stroke Father    Cancer Sister    Colon cancer Sister 56       cancer of the sigmoid colon   Heart disease Brother    Hypertension Brother    Heart disease Brother    Hyperlipidemia Brother    Hypertension Brother    Down syndrome Paternal Aunt    Cancer Paternal Uncle        cancer of the spine   COPD Maternal Grandmother    Stomach cancer Paternal Grandmother      Allergies: Allergies as of 10/18/2022   (No Known Allergies)   Review of Systems: A complete ROS was negative except as per HPI.   OBJECTIVE:   Physical Exam: Blood pressure (!) 174/135, pulse 87, temperature 97.8 F (36.6 C), temperature source Oral, resp. rate 16, height 6\' 3"  (1.905 m), weight 118.5 kg, SpO2 98%.  Constitutional: Ina cute distress and ill appearing.  HENT: normocephalic atraumatic, mucous membranes moist Eyes: conjunctiva non-erythematous Neck: supple Cardiovascular: regular rate and rhythm, no m/r/g, mild bilateral edema L>R, DP pulses present. Pulmonary/Chest: Accessory muscle use, tachypneic, with retractions. Bilateral lower lobe and right middle rales, wheezing on right middle lobe.  Abdominal: Distended, negative fluid shift, no hepatomegaly. Non-tender. MSK: normal bulk and tone Neurological: alert & oriented x 3 Skin: warm and dry Psych: Anxious  Labs: CBC    Component Value Date/Time   WBC 11.3 (H) 10/18/2022 0116   RBC 4.93 10/18/2022 0116   HGB 13.6 10/18/2022 0139   HCT 40.0 10/18/2022 0139   PLT 237 10/18/2022 0116   MCV 83.2 10/18/2022 0116   MCH 27.0 10/18/2022 0116   MCHC 32.4 10/18/2022 0116   RDW 13.9 10/18/2022 0116   LYMPHSABS 1.3 08/04/2021 1135   MONOABS 0.6 08/04/2021 1135   EOSABS 0.0 08/04/2021 1135   BASOSABS 0.1 08/04/2021 1135     CMP     Component Value Date/Time   NA 143 10/18/2022 0139   NA 141 10/05/2021 1001   K 4.0 10/18/2022 0139   CL 110 10/18/2022 0139   CO2 18 (L) 10/18/2022 0116   GLUCOSE 107 (H) 10/18/2022 0139   BUN 26 (H) 10/18/2022 0139   BUN 16 10/05/2021 1001   CREATININE 1.40 (H) 10/18/2022 0139   CREATININE 0.88 07/19/2013 1647   CALCIUM 8.7 (L) 10/18/2022 0116   PROT 7.7 10/18/2022 0445   ALBUMIN 3.7 10/18/2022 0445   AST 17 10/18/2022 0445   ALT 22 10/18/2022 0445   ALKPHOS 83 10/18/2022 0445   BILITOT 0.8 10/18/2022 0445   GFRNONAA >60 10/18/2022 0116   GFRNONAA >89 07/19/2013  1647   GFRAA >60 08/09/2019 1805   GFRAA >89 07/19/2013 1647    Imaging:  CT Angio Chest PE W and/or Wo Contrast  IMPRESSION: No evidence of pulmonary embolus. Enlarging mediastinal and bilateral hilar lymph nodes. Cannot exclude metastasis or lymphoma. Consider further evaluation with PET CT. Small bilateral pleural effusions.  Bibasilar atelectasis. Cardiomegaly, vascular congestion. Coronary artery disease. Aortic Atherosclerosis (ICD10-I70.0). Electronically Signed   By: Charlett Nose M.D.  On: 10/18/2022 02:23     EKG: Afib with RVR    ASSESSMENT & PLAN:   Assessment & Plan by Problem: Principal Problem:   Atrial fibrillation with RVR (HCC)  SAVVA BEAMER is a 62 y.o. person living with a history of NSTEMI, HTN and HFrEF  who presented with chest pain and shortness of breath admitted with a fib with RVR and acute HFrEF exacerbation.  #Acute Heart Failure Exacerbation  #CHF Class [place] III-IV Secondary to medication noncompliance over the last year due to financial hardship. BNP elevated to 415.6. Was previously prescribed entresto 49-51mg , furosemide 40mg , and spironolactone 25mg  tablet. Las echo done 08/05/2021 showed a EF 40-45% with mildly reduced LV wall function, and trivial mitral valve regurgitation. Heart cath at that time showed no significant pulmonary hypertension, PDA occlusion and ulceration and up to 90% stenosis of the RCA (08/05/2021) He has been limited in exertional abilities to the point of being unable to work, orthopnea, feeling distended like he is carrying excess fluid. Does not seem decompensated and has good peripheral perfusion. -Lasix IV 40 mg BID -Strict I/Os, daily weights -Echocardiogram -Would try and restart GDMT prior to discharge once afib more stable and able to assess what he can afford as outpatient  #Afib with avr  Rate elevated to 140s, with general improvement on diltiazem drip. Duration of atrial fibrillation unclear, but he does say over last  4-5 days he has felt palpitations so perhaps paroxysmal. Not clear why he has new onset atrial fibrillation; no signs of ischemia thus far in work-up. I suspect this contributed to heart failure exacerbation.  -Continue diltiazem drip for now, transition to PO once rate controlled -Heparin infusion -Echocardiogram as above  #Troponinemia #CAD  Hx MI. LDL 103, not currently on any medications OP. Troponin elevation is mild, 42>47. No evidence of NSTEMI on EKG. Suspect this is in the setting of demand from acute arrhythmia but given history of MI and new arrhythmia, would have low threshold to consult cardiology. -Repeat EKG -Echocardiogram as above -Restart atorvastatin 80 mg daily, aspirin 81 mg daily (prior OP doses)  #Enlarging hilar LAD  Cannot exclude metastatic disease or lymphoma, with recommendation for further work up with PET scan. Patient is lifelong tobacco user and not up to date on any of his cancer screenings with extensive family history of cancer. No B symptoms. Cannot give specifics on sputum but does not describe hemoptysis. He has been informed of these findings. -Outpatient work-up   #Emphysema  #Tobacco use disorder Not currently on outpatient therapy. No concern for acute exacerbation at this time. Continues to smoke. -Discuss affordable outpatient therapies -Encourage smoking cessation   Diet: Heart Healthy VTE: Heparin IVF: None, Code: Full  Prior to Admission Living Arrangement: Home, with spouse.  Anticipated Discharge Location: Home Barriers to Discharge: medical workup  Dispo: Admit patient to Inpatient with expected length of stay greater than 2 midnights.  Signed: [facility]  Internal Medicine Resident, PGY-1 [facility]  Pager: [phone]  10/18/2022, 6:18 AM

## 2022-10-18 NOTE — ED Notes (Signed)
This writer was called to the room by transport.  Pt c/o "sharp and stabbing" L lower rib cage/LUQ pain.  Pain lasted less than 2 minutes, but his HR increased to mid-140s and Pt sts it "took his breath."  Hospitalist team made aware.

## 2022-10-18 NOTE — ED Notes (Signed)
In CT at this time

## 2022-10-18 NOTE — ED Notes (Signed)
Pt provided w/ a personal fan.

## 2022-10-18 NOTE — ED Triage Notes (Signed)
Pt arrived from home via POV c/o centralized chest pain radiating down left arm with SOB. Pt describes it as pressure on his chest. Pt is tachycardic and tachypnea in triage. Labored. Irregular heart beat

## 2022-10-18 NOTE — ED Notes (Signed)
Patient went to vascular

## 2022-10-18 NOTE — Consult Note (Signed)
CARDIOLOGY CONSULT NOTE  Patient ID: SAUL FABIANO MRN: [id number] DOB/AGE: 1960/07/23 62 y.o.  Admit date: 10/18/2022 Referring Physician: [facility] Reason for Consultation:  Afib  HPI:   62 y.o. 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  He lost his insurance, and has not taken any medication since January 2024.  He has been having shortness of breath with minimal activity, and chest pain worse with activity, improved with nitroglycerin.  He also reports subjective fever, chills, has also had cough.  Unfortunately, he continues to smoke 2 packs of cigarettes a day, and also drinks 6 beers every day for last 4 years.  Workup in the ER so far showed BP up to 180s/130s, ruled out PE on CTA, showed vascular congestion and small pleural effusions. Trop and BNP mildly elevated.  Past Medical History:  Diagnosis Date   Acute diastolic heart failure (HCC)    Arthritis    CHF (congestive heart failure) (HCC)    Headache    Hypertension    Hypertensive urgency      Past Surgical History:  Procedure Laterality Date   LUMBAR LAMINECTOMY N/A 01/03/2014   Procedure: RIGHT EXTRAFORAMINAL APPROACH TO 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;  Surgeon: Kerrin Champagne, MD;  Location: [facility] OR;  Service: Orthopedics;  Laterality: N/A;   NOSE SURGERY     40 yrs ago   RIGHT/LEFT HEART CATH AND CORONARY ANGIOGRAPHY N/A 08/05/2021   Procedure: RIGHT/LEFT HEART CATH AND CORONARY ANGIOGRAPHY;  Surgeon: Yates Decamp, MD;  Location: [facility] INVASIVE CV LAB;  Service: Cardiovascular;  Laterality: N/A;      Family History  Problem Relation Age of Onset   Ovarian cancer Mother 51        primary peritoneal cancer   Cancer Mother    Diabetes Mother    Heart disease Mother    Hyperlipidemia Mother    Hypertension Mother    Stroke Mother    Mental illness Mother    Colon polyps Father        60 on last colonoscopy   Heart disease Father    Hyperlipidemia Father    Hypertension Father    Stroke Father    Cancer Sister    Colon cancer Sister 63       cancer of the sigmoid colon   Heart disease Brother    Hypertension Brother    Heart disease Brother    Hyperlipidemia Brother    Hypertension Brother    Down syndrome Paternal Aunt    Cancer Paternal Uncle        cancer of the spine   COPD Maternal Grandmother    Stomach cancer Paternal Grandmother      Social History: Social History   Socioeconomic History   Marital status: Married    Spouse name: Not on file   Number of children: Not on file   Years of education: Not on file   Highest education level: Not on file  Occupational History   Not on file  Tobacco Use   Smoking status: Every Day    Current packs/day: 1.50    Average packs/day: 1.5 packs/day for 0.6 years (0.8 ttl  pk-yrs)    Types: Cigarettes    Start date: 30    Last attempt to quit: 2023   Smokeless tobacco: Never  Vaping Use   Vaping status: Never Used  Substance and Sexual Activity   Alcohol use: Not Currently   Drug use: Not Currently    Types: Marijuana    Comment: 12/26/13   Sexual activity: Not on file  Other Topics Concern   Not on file  Social History Narrative   ** Merged History Encounter **       Social Determinants of Health   Financial Resource Strain: Not on file  Food Insecurity: Not on file  Transportation Needs: Not on file  Physical Activity: Not on file  Stress: Not on file  Social Connections: Not on file  Intimate Partner Violence: Not on file     (Not in a hospital admission)   Review of Systems  Cardiovascular:  Positive for chest pain and dyspnea on exertion. Negative for leg swelling,  palpitations and syncope.     Vitals:   10/18/22 0715 10/18/22 0940  BP: (!) 181/149   Pulse: (!) 48   Resp: (!) 30   Temp:  97.6 F (36.4 C)  SpO2: 97%     Physical Exam: Physical Exam Vitals and nursing note reviewed.  Constitutional:      General: He is not in acute distress. Neck:     Vascular: No JVD.  Cardiovascular:     Rate and Rhythm: Normal rate. Rhythm irregular.     Heart sounds: Normal heart sounds. No murmur heard. Pulmonary:     Effort: Pulmonary effort is normal.     Breath sounds: Examination of the right-lower field reveals rales. Examination of the left-lower field reveals rales. Rales present. No wheezing.  Musculoskeletal:     Right lower leg: Edema (1+) present.     Left lower leg: Edema (1+) present.        Imaging/tests reviewed and independently interpreted: Lab Results: CBC, BMP, trop HS  Cardiac Studies:  Telemetry 10/18/2022: Afib with controlled ventricular rate  EKG 10/18/2022: Atrial fibrillation with rapid ventricular response Minimal voltage criteria for LVH, may be normal variant ( Cornell product ) ST & T wave abnormality, consider lateral ischemia Confirmed by Nicanor Alcon, April ([id number]) on 10/18/2022 1:17:36  CTA chest 10/18/2022: No evidence of pulmonary embolus. Enlarging mediastinal and bilateral hilar lymph nodes. Cannot exclude metastasis or lymphoma. Consider further evaluation with PET CT. Small bilateral pleural effusions.  Bibasilar atelectasis. Cardiomegaly, vascular congestion. Coronary artery disease. Aortic Atherosclerosis (ICD10-I70.0).  Echocardiogram 08/05/2021:  1. Left ventricular ejection fraction, by estimation, is 40 to 45%. The left ventricle has mildly decreased function. The left ventricle demonstrates regional wall motion abnormalities (see scoring diagram/findings for description). The left ventricular   internal cavity size was dilated. There is mild left ventricular hypertrophy. Left ventricular  diastolic parameters are consistent with Grade II diastolic dysfunction (pseudonormalization). Elevated left atrial pressure.   2. Right ventricular systolic function is normal. The right ventricular size is mildly enlarged. Mildly increased right ventricular wall thickness.   3. Left atrial size was moderately dilated.   4. Right atrial size was moderately dilated.   5. The mitral valve is grossly normal. Trivial mitral valve regurgitation. No evidence of mitral stenosis.   6. The aortic valve is tricuspid. Aortic valve regurgitation is not visualized. Aortic valve sclerosis is present, with no evidence of aortic valve stenosis.   7. The inferior vena cava is dilated in size  with <50% respiratory variability, suggesting right atrial pressure of 15 mmHg.   Right and left heart catheterization 08/05/2021: RA 7/6, mean 4 mmHg RV 30/4, EDP 13 mmHg PA 32/19, mean 23 mmHg, PA saturation 74%. PW 17/17, mean 14 mmHg.  Aortic saturation 96%. QP/QS 1.00. CO 7.89, CI 3.37 by Fick. LV 126/69, EDP 16 mmHg.  Ao 130/84, mean 106 male, acute.  There was no pressure gradient across aortic valve.  Mildly elevated EDP. LV: Appears mildly dilated.  LVEF appears to be at lower limit of normal around 55%. RCA: Nondominant.  However gives a large RV branch is sent acute marginal branch, the proximal RCA has 80% stenosis followed by distal RCA about 90% long stenosis. LM: Large vessel, no disease. LAD: Large vessel, gives origin to small diagonals.  No significant disease. RI: Large vessel, no significant disease. Lateral RI: Large vessel, no significant disease. CX: Large and dominant.  Gives origin to small obtuse marginals.  Distal circumflex continues as PDA and PL branch.  The distal PDA branch is occluded and appears ulcerated.   Suspect EKG abnormalities related to distal left PDA occlusion.   Right coronary artery stenosis is inconsequential and essentially supplies the right ventricle and should not cause  any significant issues.  We will start him on Plavix in view of ACS with what appears to be an ulcerated occlusion of the left PDA.    Assessment & Recommendations:  62 y.o. 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 Aggressive diuresis, blood pressure control.  Agree with IV lasix 40 mg bid for now. Monitor I/O and adjust diuretic regimen as needed. Started inpatient on Entresto 97-103 mg bid, In light of his high blood pressure, okay to continue.  Hold beta blocker for now (currently on PO metoprolol tartrate 25 mg qid). Will start metoprolol succinate probably tomorrow 7/24 after diuresing.  Add Farxiga 10 mg daily (Will need patient assistance). Check echocardiogram.  CAD: Known CAD with PDA occlusion causing NSTEMI in 07/2021. No other consequential obstructive CAD noted at that time. His current chest pain is concerning for angina, but also comes with uncontrolled hypertension and medication noncompliance.  Troponin elevation not consistent with ACS, likely due to heart failure. Treat angina as stable angina for now with medical therapy. Could consider outpatient ischemic workup, if angina not improved on medical therapy. Added Imdur 60 mg daily, amlodipine 5 mg daily.  Will add beta blocker tomorrow (7/24).   PAF: Rate controlled. CHA2DS2VASc score 3, annual stroke risk 3.2%. Currently on IV heparin. By tomorrow morning, if no indication that he will need any inpatient invasive management, could switch to DOAC-Eliquis 5 mg bid.  Hilar lymphadenopathy: Noted on CT chest. He does endorse constitutional symptoms of subjective fever to me. If no infectious cause suspected, consider workup for lymphoma. Defer to  primary team.   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 Will need outpatient follow up.  Tobacco, alcohol abuse: Consider nicotine patch. (2 PPD) May need CIWA protocol (6 beers/day). Defer to primary team.  Discussed interpretation of tests and management recommendations with the primary team     Elder Negus, MD Pager: [phone] Office: [phone]

## 2022-10-18 NOTE — Progress Notes (Addendum)
Subjective: Michael Bell is a 62 yo M with pertinent PMHx of NSTEMI, HFrEF (40-45) in 2023, HTN, and tobacco and alcohol use who presented with chest pain and SOB found to have new onset Afib with RVR and acute heart failure exacerbation  He continues to endorse diffuse chest pain described as a heaviness on his chest rated as a 6/10 as well as dyspnea at rest and an enlarged abdomen. Prior to admission he experienced N/V and palpitations. He endorses smoking 2 ppd and drinking up to 6 beers per day. He denies alcohol use in the last week and withdrawal symptoms. He has not taken any of his home GDMT or DAPT medications since January due to financial concerns. He is currently working on Magazine features editor.   Objective: Vital signs in last 24 hours: Vitals:   10/18/22 0940 10/18/22 0945 10/18/22 1115 10/18/22 1140  BP:  (!) 185/117 (!) 159/87 (!) 151/104  Pulse:  (!) 42 (!) 41 97  Resp:  (!) 30 (!) 25 (!) 27  Temp: 97.6 F (36.4 C)     TempSrc: Oral     SpO2:  93% 95% 93%  Weight:      Height:       Weight change:   Intake/Output Summary (Last 24 hours) at 10/18/2022 1147 Last data filed at 10/18/2022 0740 Gross per 24 hour  Intake --  Output 3075 ml  Net -3075 ml   Physical exam:   BP (!) 151/104   Pulse 97   Temp 97.6 F (36.4 C) (Oral)   Resp (!) 27   Ht 6\' 3"  (1.905 m)   Wt 118.5 kg   SpO2 93%   BMI 32.65 kg/m  General appearance: alert, cooperative, and no distress Head: Normocephalic, without obvious abnormality, atraumatic Eyes: conjunctivae/corneas clear. EOM's intact.  Lungs:  crackles present at bases bilaterally Heart: irregularly irregular rhythm, no m/r/g Abdomen:  soft, distended abdomen. Non-tender on palpation Extremities:  1+ non-pitting edema bilaterally, warm to touch  Lab Results:    Latest Ref Rng & Units 10/18/2022    1:39 AM 10/18/2022    1:16 AM 08/06/2021   10:13 AM  CBC  WBC 4.0 - 10.5 K/uL  11.3  9.8   Hemoglobin 13.0 - 17.0 g/dL 16.1  09.6   04.5   Hematocrit 39.0 - 52.0 % 40.0  41.0  45.8   Platelets 150 - 400 K/uL  237  305       Latest Ref Rng & Units 10/18/2022    4:45 AM 10/18/2022    1:39 AM 10/18/2022    1:16 AM  CMP  Glucose 70 - 99 mg/dL  409  811   BUN 8 - 23 mg/dL  26  25   Creatinine 9.14 - 1.24 mg/dL  7.82  9.56   Sodium 213 - 145 mmol/L  143  138   Potassium 3.5 - 5.1 mmol/L  4.0  3.9   Chloride 98 - 111 mmol/L  110  111   CO2 22 - 32 mmol/L   18   Calcium 8.9 - 10.3 mg/dL   8.7   Total Protein 6.5 - 8.1 g/dL 7.7     Total Bilirubin 0.3 - 1.2 mg/dL 0.8     Alkaline Phos 38 - 126 U/L 83     AST 15 - 41 U/L 17     ALT 0 - 44 U/L 22      No results for input(s): "GLUCAP" in the last 72 hours. Micro Results:  No results found for this or any previous visit (from the past 240 hour(s)). Studies/Results: CT Angio Chest PE W and/or Wo Contrast  Result Date: 10/18/2022 CLINICAL DATA:  Pulmonary embolism (PE) suspected, high prob. Chest pain EXAM: CT ANGIOGRAPHY CHEST WITH CONTRAST TECHNIQUE: Multidetector CT imaging of the chest was performed using the standard protocol during bolus administration of intravenous contrast. Multiplanar CT image reconstructions and MIPs were obtained to evaluate the vascular anatomy. RADIATION DOSE REDUCTION: This exam was performed according to the departmental dose-optimization program which includes automated exposure control, adjustment of the mA and/or kV according to patient size and/or use of iterative reconstruction technique. CONTRAST:  75mL OMNIPAQUE IOHEXOL 350 MG/ML SOLN COMPARISON:  01/11/2021 FINDINGS: Cardiovascular: No filling defects in the pulmonary arteries to suggest pulmonary emboli. Cardiomegaly. Scattered coronary artery and aortic calcifications. No evidence of aortic aneurysm. Mediastinum/Nodes: Enlarging right paratracheal lymph node with a short axis diameter of 3 cm compared to 1.8 cm previously. New subcarinal adenopathy with a short axis diameter of 1.7 cm. AP  window and prevascular lymph nodes borderline or mildly enlarged. Mildly enlarged right hilar lymph node with a short axis diameter of 1.6 cm, new since prior study. Borderline left hilar lymph node with a short axis diameter of 1.2 cm. Trachea and esophagus are unremarkable. Thyroid unremarkable. Lungs/Pleura: Centrilobular and paraseptal emphysema. Small bilateral pleural effusions. Compressive atelectasis in the lower lobes. Mild vascular congestion. Upper Abdomen: No acute findings in the upper abdomen. Musculoskeletal: Chest wall soft tissues are unremarkable. No acute bony abnormality. Review of the MIP images confirms the above findings. IMPRESSION: No evidence of pulmonary embolus. Enlarging mediastinal and bilateral hilar lymph nodes. Cannot exclude metastasis or lymphoma. Consider further evaluation with PET CT. Small bilateral pleural effusions.  Bibasilar atelectasis. Cardiomegaly, vascular congestion. Coronary artery disease. Aortic Atherosclerosis (ICD10-I70.0). Electronically Signed   By: Charlett Nose M.D.   On: 10/18/2022 02:23   Medications: I have reviewed the patient's current medications. Scheduled Meds:  amLODipine  5 mg Oral Daily   aspirin EC  81 mg Oral Daily   atorvastatin  80 mg Oral Daily   dapagliflozin propanediol  5 mg Oral Daily   furosemide  40 mg Intravenous Q12H   isosorbide mononitrate  60 mg Oral Daily   sacubitril-valsartan  1 tablet Oral BID   Continuous Infusions:  heparin 2,050 Units/hr (10/18/22 1144)   PRN Meds:.acetaminophen **OR** acetaminophen, polyethylene glycol Assessment/Plan: Principal Problem:   Atrial fibrillation with RVR (HCC) Active Problems:   HTN, goal below 130/80   Tobacco user   Acute on chronic systolic heart failure (HCC)   Alcohol use  Michael Bell is a 62 yo M with pertinent PMHx of NSTEMI, HFrEF (40-45) in 2023, HTN, and tobacco and alcohol use who presented with chest pain and SOB found to have new onset Afib with RVR and acute  heart failure exacerbation  #Afib w/ RVR Initial rates elevated to 140s, with general improvement on diltiazem drip. Duration of atrial fibrillation unclear, but he does say over last 4-5 days he has felt palpitations so perhaps paroxysmal. CHADSVAC score: 3 (HTN, NSTEMI, HFrEF). TSH nl. Possible etiologies include substance use history (ETOH, tobacco), hypertension, and concomitant uncontrolled HF   -Discontinued diltiazem drip, given prior reduced EF (EF 40-45%), currently rate controlled  -ECHO -Cardiology following, appreciate recs as discussed below  -Hep gtt -If no need for invasive intervention, consider switch from heparin to Eliquis 5mg  BID  -Start amiodarone if needed to better control Afib  #Acute  on Chronic Heart Failure Exacerbation (EF 40-45%) #CHF NYHA class III-IV Secondary to medication non-adherence over the last 6 months due to financial hardship. BNP elevated to 415.6 on admission. Prior GDMT included entresto 97-103mg , furosemide 40mg , spironolactone 25mg  tablet, and carvedilol 25mg . Prior ECHO done 08/05/2021 showed EF 40-45% with mildly reduced LV wall function. Acute exacerbation likely secondary to new Afib with RVR. -IV Lasix bid -Continue GDMT as follows: -Start Entresto 97-103mg  BID -Start Farxiga 5mg  daily, per cards recs   -Hold further beta blocker for now (received PO metoprolol tartrate 25 mg x1). Consider metoprolol succinate starting 7/24 after diuresing, per cardiology   -Start Imdur 60mg  daily per cards -Strict I/Os, daily weights -Obtain ECHO -PT eval   #Elevated tropnonin  H/o CAD #C/f Angina LDL 103, has not taken home medications. Troponins: 42 --> 47 --> 40. Initial EKG showed Afib with some ST segment and T wave abnormalities c/f ischemia. Cath on 08/05/21 showed PDA occlusion and ulceration and up to 90% stenosis of the RCA which was medically managed. Suspect iso of non-adherence with medications and acute HF exacerbation.  -ECHO  -Cont  atorvastatin 80 mg daily -Cont bASA.    #Enlarging hilar LAD  CT angio chest PE showed no evidence of an embolus but rather an enlarging mediastinal and bilateral hilar lymph nodes. Cannot exclude metastatic disease or lymphoma, with recommendation for further work up with PET scan. Patient is lifelong tobacco user and not up to date on any of his cancer screenings with extensive family history of cancer. No B symptoms -Outpatient work-up -Continue to monitor oxygen status    #Emphysema  #Tobacco use disorder #Alcohol use  Not currently on outpatient therapy. Continues to smoke. Denies nicotine replacement therapies -Discuss affordable outpatient therapies -Encourage smoking cessation -Consider CIWA protocol     #Uncontrolled HTN  Has been unable to take home medications due to financial difficulties. -Start amlodipine 5mg  daily, per cards -Continue remaining antihypertensives as stated above    #H/o AAA Reported infrarenal abdominal aortic aneurysm 3.7 cm on U/S on 07/2021. Recommend follow-up ultrasound every 2 years -Outpatient follow-up   Diet: Heart Healthy VTE: Heparin IVF: None, Code: Full   Prior to Admission Living Arrangement: Home, with spouse.  Anticipated Discharge Location: Home Barriers to Discharge: medical workup   Dispo: Admit patient to Inpatient with expected length of stay greater than 2 midnights  This is a Psychologist, occupational Note.  The care of the patient was discussed with Dr. Ned Card and the assessment and plan formulated with their assistance.  Please see their attached note for official documentation of the daily encounter.   LOS: 0 days   Loyal Buba 10/18/2022, 11:47 AM  Attestation for Student Documentation:  I personally was present and performed or re-performed the history, physical exam and medical decision-making activities of this service and have verified that the service and findings are accurately documented in the student's  note.  Chauncey Mann, DO 10/18/2022, 3:30 PM

## 2022-10-18 NOTE — ED Notes (Signed)
Pt transported to ECHO. 

## 2022-10-18 NOTE — Progress Notes (Signed)
ANTICOAGULATION CONSULT NOTE - Initial Consult  Pharmacy Consult for heparin Indication: atrial fibrillation  No Known Allergies  Patient Measurements: Height: 6\' 3"  (190.5 cm) Weight: 118.5 kg (261 lb 3.9 oz) IBW/kg (Calculated) : 84.5 Heparin Dosing Weight: 109.5 kg  Vital Signs: Temp: 97.8 F (36.6 C) (07/23 0115) Temp Source: Oral (07/23 0115) BP: 178/128 (07/23 0217) Pulse Rate: 96 (07/23 0217)  Labs: Recent Labs    10/18/22 0116 10/18/22 0139  HGB 13.3 13.6  HCT 41.0 40.0  PLT 237  --   CREATININE 1.30* 1.40*  TROPONINIHS 42*  --     Estimated Creatinine Clearance: 75.9 mL/min (A) (by C-G formula based on SCr of 1.4 mg/dL (H)).   Medical History: Past Medical History:  Diagnosis Date   Acute diastolic heart failure (HCC)    Arthritis    CHF (congestive heart failure) (HCC)    Headache    Hypertension    Hypertensive urgency    Assessment: 57 YoM presented to the ED with chest pain and SOB with irregular heart beat. PMH of chronic HF with reduced EF, HTN, and anxiety-reports not taking meds for ~1 year. Pharmacy has been consulted to dose heparin for afib. No PTA anticoagulation and CBC stable.  Goal of Therapy:  Heparin level 0.3-0.7 units/ml Monitor platelets by anticoagulation protocol: Yes   Plan:  Give 5000 units bolus x 1 Start heparin infusion at 1600 units/hr Check anti-Xa level in 6 hours and daily while on heparin Continue to monitor H&H and platelets Follow up transition to DOAC  Arabella Merles, PharmD. Clinical Pharmacist 10/18/2022 3:09 AM

## 2022-10-18 NOTE — Progress Notes (Signed)
Heart Failure Navigator Progress Note  Assessed for Heart & Vascular TOC clinic readiness.  Patient does not meet criteria due to [facility] patient.   Navigator will sign off at this time.    Dawn Fields, BSN, RN Heart Failure Nurse Navigator Secure Chat Only   

## 2022-10-18 NOTE — Progress Notes (Signed)
ANTICOAGULATION CONSULT NOTE - Follow Up Consult  Pharmacy Consult for continuous infusion of unfractionated heparin per Pharmacy Protocol Indication: atrial fibrillation  No Known Allergies  Patient Measurements: Height: 6\' 3"  (190.5 cm) Weight: 118.5 kg (261 lb 3.9 oz) IBW/kg (Calculated) : 84.5 Heparin Dosing Weight: 109.5  Vital Signs: Temp: 97.6 F (36.4 C) (07/23 0940) Temp Source: Oral (07/23 0940) BP: 151/104 (07/23 1140) Pulse Rate: 97 (07/23 1140)  Labs: Recent Labs    10/18/22 0116 10/18/22 0139 10/18/22 0320 10/18/22 0948  HGB 13.3 13.6  --   --   HCT 41.0 40.0  --   --   PLT 237  --   --   --   HEPARINUNFRC  --   --   --  0.13*  CREATININE 1.30* 1.40*  --   --   TROPONINIHS 42*  --  47* 40*    Estimated Creatinine Clearance: 75.9 mL/min (A) (by C-G formula based on SCr of 1.4 mg/dL (H)).  Assessment: Appropriately time heparin level reflecting continuous infusion of unfractionated heparin without interruption per RN drawn at 0948 today is 0.13 u/mL on 1600 units of heparin per hour. Per protocol, this requires a re-bolus with 30 units of heparin per kilogram (dosing weight = 109.5 kilograms = 3285 unit bolus, will round up to 3300 unit bolus. Increase in continuous infusion unfractionated heparin requires a 3-4 unit/kg/hr increase in rate. Will increase by 109.5 kg x 4 units/kg/hr = 438 units/hr (rounded to 450 units/hr) for a total of 2050 units/hr. Will repeat heparin level at 1800 hours today. Goal 0.3 - 0.7 units/mL.  Goal of Therapy:  Heparin level 0.3-0.7 units/ml Monitor platelets by anticoagulation protocol: Yes   Plan:  Give 3300 units bolus x 1 Check anti-Xa level in 6 hours and daily while on heparin Continue to monitor H&H and platelets (order placed for 0500 hours 10/19/22)  Elicia Lamp, PharmD, CPP 10/18/2022,11:43 AM

## 2022-10-18 NOTE — ED Provider Notes (Signed)
[facility] AT [facility] Provider Note   CSN: [id number] Arrival date & time: 10/18/22  0107     History  Chief Complaint  Patient presents with   Chest Pain   Shortness of Breath    Michael Bell is a 62 y.o. male.  Patient with past medical history significant for chronic heart failure with reduced ejection fraction, emphysema, hypertension, anxiety presents to the emergency department complaining of 2 days of 10 out of 10 centralized chest pain, shortness of breath, weakness.  Patient states he has not taken any of his medications for approximately 1 year due to financial constraints.  He denies history of atrial fibrillation.  He states that his chest pain has alternated between a severe heaviness to a sharp pain.  He reports significant shortness of breath and lower extremity swelling.  The patient is a daily pack and a half smoker.  The patient also has a history of alcohol use but states he has had no alcohol for approximately 1 week.  He denies abdominal pain, nausea, vomiting at this time.  HPI     Home Medications Prior to Admission medications   Medication Sig Start Date End Date Taking? Authorizing Provider  albuterol (VENTOLIN HFA) 108 (90 Base) MCG/ACT inhaler Inhale 1-2 puffs into the lungs every 4 (four) hours as needed for wheezing or shortness of breath. 08/06/21   Steffanie Rainwater, MD  aspirin EC 81 MG tablet Take 1 tablet (81 mg total) by mouth daily. 02/08/22   Marrianne Mood, MD  atorvastatin (LIPITOR) 80 MG tablet Take 1 tablet (80 mg total) by mouth at bedtime. 04/26/22   Marrianne Mood, MD  carvedilol (COREG) 25 MG tablet Take 1 tablet (25 mg total) by mouth 2 (two) times daily with a meal. 12/20/21   Adron Bene, MD  clonazePAM (KLONOPIN) 0.5 MG tablet Take 1 tablet (0.5 mg total) by mouth 2 (two) times daily for 14 days. 10/05/21 10/19/21  Willette Cluster, MD  clopidogrel (PLAVIX) 75 MG tablet Take 1 tablet (75 mg total)  by mouth daily. 04/26/22   Marrianne Mood, MD  dapagliflozin propanediol (FARXIGA) 10 MG TABS tablet Take 1 tablet (10 mg total) by mouth daily before breakfast. 04/26/22   Tolia, Sunit, DO  escitalopram (LEXAPRO) 10 MG tablet Take 1 tablet (10 mg total) by mouth daily. 11/16/21 03/03/22  Steffanie Rainwater, MD  fluticasone (FLONASE) 50 MCG/ACT nasal spray Place 2 sprays into both nostrils daily. 02/15/22   Marrianne Mood, MD  furosemide (LASIX) 40 MG tablet Take 1 tablet (40 mg total) by mouth daily. 11/05/21   Steffanie Rainwater, MD  ranolazine (RANEXA) 500 MG 12 hr tablet Take 1 tablet (500 mg total) by mouth 2 (two) times daily. 04/26/22   Tolia, Sunit, DO  sacubitril-valsartan (ENTRESTO) 97-103 MG Take 1 tablet by mouth 2 (two) times daily. 03/10/22   Marrianne Mood, MD  umeclidinium-vilanterol (ANORO ELLIPTA) 62.5-25 MCG/ACT AEPB Inhale 1 puff into the lungs daily. 04/26/22   Marrianne Mood, MD      Allergies    Patient has no known allergies.    Review of Systems   Review of Systems  Physical Exam Updated Vital Signs BP (!) 174/135   Pulse 87   Temp 97.8 F (36.6 C) (Oral)   Resp 16   Ht 6\' 3"  (1.905 m)   Wt 118.5 kg   SpO2 98%   BMI 32.65 kg/m  Physical Exam Vitals and nursing note reviewed.  Constitutional:  General: He is not in acute distress.    Appearance: He is well-developed.  HENT:     Head: Normocephalic and atraumatic.  Eyes:     Conjunctiva/sclera: Conjunctivae normal.  Cardiovascular:     Rate and Rhythm: Tachycardia present. Rhythm irregular.  Pulmonary:     Effort: Pulmonary effort is normal. Tachypnea present.     Comments: Patient with mildly tachypneic, shallow breathing.  Lung are clear to auscultation Chest:     Chest wall: No tenderness.  Abdominal:     Palpations: Abdomen is soft.     Tenderness: There is no abdominal tenderness.  Musculoskeletal:        General: No swelling.     Cervical back: Neck supple.     Right lower leg:  No edema.     Left lower leg: No edema.     Comments: No significant edema noted to bilateral lower extremities  Skin:    General: Skin is warm and dry.     Capillary Refill: Capillary refill takes less than 2 seconds.  Neurological:     Mental Status: He is alert.  Psychiatric:        Mood and Affect: Mood normal.     ED Results / Procedures / Treatments   Labs (all labs ordered are listed, but only abnormal results are displayed) Labs Reviewed  BASIC METABOLIC PANEL - Abnormal; Notable for the following components:      Result Value   CO2 18 (*)    Glucose, Bld 112 (*)    BUN 25 (*)    Creatinine, Ser 1.30 (*)    Calcium 8.7 (*)    All other components within normal limits  CBC - Abnormal; Notable for the following components:   WBC 11.3 (*)    All other components within normal limits  BRAIN NATRIURETIC PEPTIDE - Abnormal; Notable for the following components:   B Natriuretic Peptide 415.6 (*)    All other components within normal limits  I-STAT CHEM 8, ED - Abnormal; Notable for the following components:   BUN 26 (*)    Creatinine, Ser 1.40 (*)    Glucose, Bld 107 (*)    TCO2 20 (*)    All other components within normal limits  TROPONIN I (HIGH SENSITIVITY) - Abnormal; Notable for the following components:   Troponin I (High Sensitivity) 42 (*)    All other components within normal limits  HEPARIN LEVEL (UNFRACTIONATED)  TROPONIN I (HIGH SENSITIVITY)    EKG EKG Interpretation Date/Time:  Tuesday October 18 2022 01:14:45 EDT Ventricular Rate:  143 PR Interval:    QRS Duration:  112 QT Interval:  342 QTC Calculation: 527 R Axis:   -6  Text Interpretation: Atrial fibrillation with rapid ventricular response Minimal voltage criteria for LVH, may be normal variant ( Cornell product ) ST & T wave abnormality, consider lateral ischemia Confirmed by Nicanor Alcon, April ([id number]) on 10/18/2022 1:17:36 AM  Radiology CT Angio Chest PE W and/or Wo Contrast  Result Date:  10/18/2022 CLINICAL DATA:  Pulmonary embolism (PE) suspected, high prob. Chest pain EXAM: CT ANGIOGRAPHY CHEST WITH CONTRAST TECHNIQUE: Multidetector CT imaging of the chest was performed using the standard protocol during bolus administration of intravenous contrast. Multiplanar CT image reconstructions and MIPs were obtained to evaluate the vascular anatomy. RADIATION DOSE REDUCTION: This exam was performed according to the departmental dose-optimization program which includes automated exposure control, adjustment of the mA and/or kV according to patient size and/or use of iterative reconstruction technique.  CONTRAST:  75mL OMNIPAQUE IOHEXOL 350 MG/ML SOLN COMPARISON:  01/11/2021 FINDINGS: Cardiovascular: No filling defects in the pulmonary arteries to suggest pulmonary emboli. Cardiomegaly. Scattered coronary artery and aortic calcifications. No evidence of aortic aneurysm. Mediastinum/Nodes: Enlarging right paratracheal lymph node with a short axis diameter of 3 cm compared to 1.8 cm previously. New subcarinal adenopathy with a short axis diameter of 1.7 cm. AP window and prevascular lymph nodes borderline or mildly enlarged. Mildly enlarged right hilar lymph node with a short axis diameter of 1.6 cm, new since prior study. Borderline left hilar lymph node with a short axis diameter of 1.2 cm. Trachea and esophagus are unremarkable. Thyroid unremarkable. Lungs/Pleura: Centrilobular and paraseptal emphysema. Small bilateral pleural effusions. Compressive atelectasis in the lower lobes. Mild vascular congestion. Upper Abdomen: No acute findings in the upper abdomen. Musculoskeletal: Chest wall soft tissues are unremarkable. No acute bony abnormality. Review of the MIP images confirms the above findings. IMPRESSION: No evidence of pulmonary embolus. Enlarging mediastinal and bilateral hilar lymph nodes. Cannot exclude metastasis or lymphoma. Consider further evaluation with PET CT. Small bilateral pleural  effusions.  Bibasilar atelectasis. Cardiomegaly, vascular congestion. Coronary artery disease. Aortic Atherosclerosis (ICD10-I70.0). Electronically Signed   By: Charlett Nose M.D.   On: 10/18/2022 02:23    Procedures .Critical Care  Performed by: Darrick Grinder, PA-C Authorized by: Darrick Grinder, PA-C   Critical care provider statement:    Critical care time (minutes):  30   Critical care was time spent personally by me on the following activities:  Development of treatment plan with patient or surrogate, discussions with consultants, evaluation of patient's response to treatment, examination of patient, ordering and review of laboratory studies, ordering and review of radiographic studies, ordering and performing treatments and interventions, pulse oximetry, re-evaluation of patient's condition and review of old charts     Medications Ordered in ED Medications  diltiazem (CARDIZEM) 1 mg/mL load via infusion 10 mg (10 mg Intravenous Bolus from Bag 10/18/22 0132)    And  diltiazem (CARDIZEM) 125 mg in dextrose 5% 125 mL (1 mg/mL) infusion (7.5 mg/hr Intravenous Rate/Dose Change 10/18/22 0313)  heparin bolus via infusion 5,000 Units (has no administration in time range)  heparin ADULT infusion 100 units/mL (25000 units/244mL) (has no administration in time range)  iohexol (OMNIPAQUE) 350 MG/ML injection 75 mL (75 mLs Intravenous Contrast Given 10/18/22 0214)  furosemide (LASIX) injection 60 mg ( Intravenous Canceled Entry 10/18/22 0322)    ED Course/ Medical Decision Making/ A&P                             Medical Decision Making Amount and/or Complexity of Data Reviewed Labs: ordered. Radiology: ordered.  Risk Prescription drug management. Decision regarding hospitalization.   This patient presents to the ED for concern of chest pain and shortness of breath, this involves an extensive number of treatment options, and is a complaint that carries with it a high risk of  complications and morbidity.  The differential diagnosis includes ACS, pulmonary embolism, dysrhythmia, heart failure exacerbation, others   Co morbidities that complicate the patient evaluation  Heart failure with reduced ejection fraction, noncompliant with medication   Lab Tests:  I Ordered, and personally interpreted labs.  The pertinent results include: BNP 415.6, troponin 42, creatinine 1.3, WBC 11.3   Imaging Studies ordered:  I ordered imaging studies including chest x-ray, CT angio chest PE study I independently visualized and interpreted imaging which showed  No evidence of pulmonary embolus.    Enlarging mediastinal and bilateral hilar lymph nodes. Cannot  exclude metastasis or lymphoma. Consider further evaluation with PET  CT.    Small bilateral pleural effusions.  Bibasilar atelectasis.    Cardiomegaly, vascular congestion.    Coronary artery disease.    Aortic Atherosclerosis    I agree with the radiologist interpretation   Cardiac Monitoring: / EKG:  The patient was maintained on a cardiac monitor.  I personally viewed and interpreted the cardiac monitored which showed an underlying rhythm of: A-fib RVR, rate 143   Consultations Obtained:  I requested consultation with the internal medicine teaching service,  and discussed lab and imaging findings as well as pertinent plan - they recommend: Plan on admission   Problem List / ED Course / Critical interventions / Medication management   I ordered medication including diltiazem bolus and infusion , heparin infusion for atrial fibrillation Reevaluation of the patient after these medicines showed that the patient improved I have reviewed the patients home medicines and have made adjustments as needed   Social Determinants of Health:  Patient states he is unable to afford medical care.  He states he did not take an ambulance tonight due to affordability issues and has been unable to take any medication  due to not being able to afford it for the past year.   Test / Admission - Considered:  Patient with apparent new onset A-fib RVR, rate controlled with diltiazem.  Patient appears to be mildly fluid overloaded administered Lasix for diuresis.  Patient states after medications he denies feel less chest pressure.  Patient with mildly elevated troponin, likely due to demand.  Patient with enlarging bilateral hilar and mediastinal lymph nodes concerning for possible malignancy.  I did discuss with patient the potential of malignancy and the likelihood of further workup.  Patient will need admission for further medical management and likely further workup of possible malignancy         Final Clinical Impression(s) / ED Diagnoses Final diagnoses:  Atrial fibrillation with rapid ventricular response (HCC)  Acute on chronic congestive heart failure, unspecified heart failure type [facility])    Rx / DC Orders ED Discharge Orders     None         Pamala Duffel 10/18/22 0352    Palumbo, April, MD 10/18/22 [phone]

## 2022-10-18 NOTE — Progress Notes (Signed)
ANTICOAGULATION CONSULT NOTE - Follow-up Note  Pharmacy Consult for Heparin Indication: atrial fibrillation  No Known Allergies  Patient Measurements: Height: 6\' 3"  (190.5 cm) Weight: 118.5 kg (261 lb 3.9 oz) IBW/kg (Calculated) : 84.5 Heparin Dosing Weight: 109.5 kg  Vital Signs: Temp: 97.7 F (36.5 C) (07/23 1740) Temp Source: Oral (07/23 1350) BP: 136/87 (07/23 1740) Pulse Rate: 106 (07/23 1740)  Labs: Recent Labs    10/18/22 0116 10/18/22 0139 10/18/22 0320 10/18/22 0948 10/18/22 1750  HGB 13.3 13.6  --   --   --   HCT 41.0 40.0  --   --   --   PLT 237  --   --   --   --   HEPARINUNFRC  --   --   --  0.13* 0.38  CREATININE 1.30* 1.40*  --   --   --   TROPONINIHS 42*  --  47* 40*  --     Estimated Creatinine Clearance: 75.9 mL/min (A) (by C-G formula based on SCr of 1.4 mg/dL (H)).   Medical History: Past Medical History:  Diagnosis Date   Acute diastolic heart failure (HCC)    Arthritis    CHF (congestive heart failure) (HCC)    Headache    Hypertension    Hypertensive urgency     Medications:  (Not in a hospital admission)  Scheduled:   amLODipine  5 mg Oral Daily   aspirin EC  81 mg Oral Daily   atorvastatin  80 mg Oral Daily   dapagliflozin propanediol  5 mg Oral Daily   furosemide  40 mg Intravenous Q12H   isosorbide mononitrate  60 mg Oral Daily   sacubitril-valsartan  1 tablet Oral BID   Infusions:   heparin 2,050 Units/hr (10/18/22 1355)   PRN: acetaminophen **OR** acetaminophen, hydrOXYzine, polyethylene glycol  Assessment: 62 yom with a history of H/O NSTEMI, CHF, HA, HTN, and Hypertensive urgency. Heparin per pharmacy consult placed for atrial fibrillation.  Patient was not on anticoagulation prior to arrival. Started on 1600 units/hr IV heparin following 5000 unit IV heparin bolus. Resulting heparin level was 0.13 which is sub-therapeutic.  Patient re-bolused and increased to 2050 units/hr. Re-check this evening is 0.38 which is  therapeutic.  No issues with infusion or bleeding per RN.  Hgb 13.6; plt 237  Goal of Therapy:  Heparin level 0.3-0.7 units/ml Monitor platelets by anticoagulation protocol: Yes   Plan:  Continue heparin infusion at 2050 units/hr Check anti-Xa level at 0000 and daily while on heparin Continue to monitor H&H and platelets  Delmar Landau, PharmD, BCPS 10/18/2022 7:03 PM ED Clinical Pharmacist -  [phone]

## 2022-10-18 NOTE — ED Notes (Signed)
Pt found to have turned oxygen up to 4L [place].  Pt noted to be more SOB than earlier.  Also, Pt's sister pointed out a sore on L lower back area.  Sister sts "it looks like his shingles are flaring up again." Will make Hospitalist team aware.

## 2022-10-19 LAB — CBC WITH DIFFERENTIAL/PLATELET
Abs Immature Granulocytes: 0.03 10*3/uL (ref 0.00–0.07)
Basophils Absolute: 0.1 10*3/uL (ref 0.0–0.1)
Basophils Relative: 1 %
Eosinophils Absolute: 0.3 10*3/uL (ref 0.0–0.5)
Eosinophils Relative: 3 %
HCT: 42.2 % (ref 39.0–52.0)
Hemoglobin: 13.7 g/dL (ref 13.0–17.0)
Immature Granulocytes: 0 %
Lymphocytes Relative: 36 %
Lymphs Abs: 3.9 10*3/uL (ref 0.7–4.0)
MCH: 27.3 pg (ref 26.0–34.0)
MCHC: 32.5 g/dL (ref 30.0–36.0)
MCV: 84.1 fL (ref 80.0–100.0)
Monocytes Absolute: 1 10*3/uL (ref 0.1–1.0)
Monocytes Relative: 9 %
Neutro Abs: 5.6 10*3/uL (ref 1.7–7.7)
Neutrophils Relative %: 51 %
Platelets: 260 10*3/uL (ref 150–400)
RBC: 5.02 MIL/uL (ref 4.22–5.81)
RDW: 13.9 % (ref 11.5–15.5)
WBC: 10.9 10*3/uL — ABNORMAL HIGH (ref 4.0–10.5)
nRBC: 0 % (ref 0.0–0.2)

## 2022-10-19 LAB — BASIC METABOLIC PANEL
Anion gap: 12 (ref 5–15)
BUN: 27 mg/dL — ABNORMAL HIGH (ref 8–23)
CO2: 23 mmol/L (ref 22–32)
Calcium: 8.5 mg/dL — ABNORMAL LOW (ref 8.9–10.3)
Chloride: 104 mmol/L (ref 98–111)
Creatinine, Ser: 1.47 mg/dL — ABNORMAL HIGH (ref 0.61–1.24)
GFR, Estimated: 54 mL/min — ABNORMAL LOW (ref >60)
Glucose, Bld: 115 mg/dL — ABNORMAL HIGH (ref 70–99)
Potassium: 3 mmol/L — ABNORMAL LOW (ref 3.5–5.1)
Sodium: 139 mmol/L (ref 135–145)

## 2022-10-19 LAB — HEPARIN LEVEL (UNFRACTIONATED): Heparin Unfractionated: 0.4 IU/mL (ref 0.30–0.70)

## 2022-10-19 LAB — MAGNESIUM: Magnesium: 1.8 mg/dL (ref 1.7–2.4)

## 2022-10-19 LAB — TROPONIN I (HIGH SENSITIVITY): Troponin I (High Sensitivity): 40 ng/L — ABNORMAL HIGH (ref <18)

## 2022-10-19 MED ORDER — AMIODARONE HCL IN DEXTROSE 360-4.14 MG/200ML-% IV SOLN
60.0000 mg/h | INTRAVENOUS | Status: AC
  Administered 2022-10-19: 60 mg/h via INTRAVENOUS
  Filled 2022-10-19: qty 200

## 2022-10-19 MED ORDER — POTASSIUM CHLORIDE CRYS ER 20 MEQ PO TBCR
60.0000 meq | EXTENDED_RELEASE_TABLET | Freq: Once | ORAL | Status: AC
  Administered 2022-10-19: 60 meq via ORAL
  Filled 2022-10-19: qty 3

## 2022-10-19 MED ORDER — AMIODARONE HCL IN DEXTROSE 360-4.14 MG/200ML-% IV SOLN
30.0000 mg/h | INTRAVENOUS | Status: AC
  Administered 2022-10-19 – 2022-10-20 (×2): 30 mg/h via INTRAVENOUS
  Filled 2022-10-19 (×3): qty 200

## 2022-10-19 MED ORDER — METOPROLOL SUCCINATE ER 25 MG PO TB24
25.0000 mg | ORAL_TABLET | Freq: Every day | ORAL | Status: DC
  Administered 2022-10-19 – 2022-10-21 (×3): 25 mg via ORAL
  Filled 2022-10-19 (×3): qty 1

## 2022-10-19 MED ORDER — AMIODARONE LOAD VIA INFUSION
150.0000 mg | Freq: Once | INTRAVENOUS | Status: AC
  Administered 2022-10-19: 150 mg via INTRAVENOUS
  Filled 2022-10-19: qty 83.34

## 2022-10-19 MED ORDER — MAGNESIUM SULFATE 2 GM/50ML IV SOLN
2.0000 g | Freq: Once | INTRAVENOUS | Status: AC
  Administered 2022-10-19: 2 g via INTRAVENOUS
  Filled 2022-10-19: qty 50

## 2022-10-19 MED ORDER — NICOTINE 21 MG/24HR TD PT24
21.0000 mg | MEDICATED_PATCH | Freq: Every day | TRANSDERMAL | Status: DC
  Administered 2022-10-19 – 2022-10-21 (×3): 21 mg via TRANSDERMAL
  Filled 2022-10-19 (×4): qty 1

## 2022-10-19 MED ORDER — FUROSEMIDE 10 MG/ML IJ SOLN
40.0000 mg | Freq: Every day | INTRAMUSCULAR | Status: DC
  Administered 2022-10-20 – 2022-10-21 (×2): 40 mg via INTRAVENOUS
  Filled 2022-10-19 (×2): qty 4

## 2022-10-19 MED ORDER — AMIODARONE LOAD VIA INFUSION
150.0000 mg | Freq: Once | INTRAVENOUS | Status: DC
  Filled 2022-10-19: qty 83.34

## 2022-10-19 MED ORDER — SACUBITRIL-VALSARTAN 97-103 MG PO TABS
1.0000 | ORAL_TABLET | Freq: Two times a day (BID) | ORAL | Status: DC
  Administered 2022-10-20: 1 via ORAL
  Filled 2022-10-19 (×2): qty 1

## 2022-10-19 MED ORDER — POTASSIUM CHLORIDE 10 MEQ/100ML IV SOLN
10.0000 meq | INTRAVENOUS | Status: AC
  Administered 2022-10-19 (×4): 10 meq via INTRAVENOUS
  Filled 2022-10-19 (×4): qty 100

## 2022-10-19 NOTE — Evaluation (Signed)
Physical Therapy Evaluation Patient Details Name: Michael Bell MRN: [id number] DOB: 01-Mar-1961 Today's Date: 10/19/2022  History of Present Illness  62 yo male presents to [facility] on 7/23 wit hSOB, chest pain. Workup for acute HF exacerbation and afib RVR. PMHx of NSTEMI, HFrEF (40-45) in 2023, HTN, and tobacco and alcohol use.  Clinical Impression  Pt admitted with/for SOB and chest pain.  Cardiology w/u continues.  Pt currently limited functionally due to the problems listed below.  (see problems list.)  Pt will benefit from PT to maximize function and safety to be able to get home safely with available assist.         Assistance Recommended at Discharge PRN  If plan is discharge home, recommend the following:  Can travel by private vehicle  Assistance with cooking/housework;A little help with bathing/dressing/bathroom;Help with stairs or ramp for entrance        Equipment Recommendations None recommended by PT  Recommendations for Other Services       Functional Status Assessment Patient has had a recent decline in their functional status and demonstrates the ability to make significant improvements in function in a reasonable and predictable amount of time.     Precautions / Restrictions Precautions Precautions: Other (comment) (low fall risk) Restrictions Weight Bearing Restrictions: No      Mobility  Bed Mobility Overal bed mobility: Modified Independent                  Transfers Overall transfer level: Needs assistance   Transfers: Sit to/from Stand Sit to Stand: Modified independent (Device/Increase time)                Ambulation/Gait Ambulation/Gait assistance: Supervision Gait Distance (Feet): 50 Feet (x2 with need for sitting rest due to SOB/distress) Assistive device: None, IV Pole Gait Pattern/deviations: Step-through pattern   Gait velocity interpretation: <1.8 ft/sec, indicate of risk for recurrent falls   General Gait Details:  shuffled though generally steady gait, rest needed due to SOB, though Sats maintained >93/94%, HR labile  from 80's to high 130's in afib.  Stairs            Wheelchair Mobility     Tilt Bed    Modified Rankin (Stroke Patients Only)       Balance Overall balance assessment: Modified Independent                                           Pertinent Vitals/Pain Pain Assessment Pain Assessment: No/denies pain    Home Living Family/patient expects to be discharged to:: Private residence Living Arrangements: Spouse/significant other Available Help at Discharge: Family;Available PRN/intermittently Type of Home: House Home Access: Level entry       Home Layout: One level Home Equipment: Cane - single point      Prior Function Prior Level of Function : Independent/Modified Independent               ADLs Comments: pt reported able to drive and run and errand with places to sit.     Hand Dominance        Extremity/Trunk Assessment   Upper Extremity Assessment Upper Extremity Assessment: Overall WFL for tasks assessed    Lower Extremity Assessment Lower Extremity Assessment: Overall WFL for tasks assessed    Cervical / Trunk Assessment Cervical / Trunk Assessment: Normal  Communication   Communication: No difficulties  Cognition Arousal/Alertness: Awake/alert Behavior During Therapy: WFL for tasks assessed/performed, Anxious Overall Cognitive Status: Within Functional Limits for tasks assessed                                 General Comments: pt did report losing his train of thought during session, anxious, a little jittery        General Comments General comments (skin integrity, edema, etc.): Sats adequeate on RA overall, Pt in afib and labile with gait. to max witnessed HR at 139 bpm    Exercises     Assessment/Plan    PT Assessment Patient needs continued PT services  PT Problem List Decreased activity  tolerance;Decreased mobility;Cardiopulmonary status limiting activity       PT Treatment Interventions DME instruction;Gait training;Functional mobility training;Therapeutic activities;Patient/family education    PT Goals (Current goals can be found in the Care Plan section)  Acute Rehab PT Goals Patient Stated Goal: home, after cardiology plan PT Goal Formulation: With patient Time For Goal Achievement: 10/26/22 Potential to Achieve Goals: Good    Frequency Min 3X/week     Co-evaluation               AM-PAC PT "6 Clicks" Mobility  Outcome Measure Help needed turning from your back to your side while in a flat bed without using bedrails?: None Help needed moving from lying on your back to sitting on the side of a flat bed without using bedrails?: None Help needed moving to and from a bed to a chair (including a wheelchair)?: None Help needed standing up from a chair using your arms (e.g., wheelchair or bedside chair)?: None Help needed to walk in hospital room?: None Help needed climbing 3-5 steps with a railing? : A Little 6 Click Score: 23    End of Session   Activity Tolerance: Patient limited by fatigue Patient left: in bed;with call bell/phone within reach Nurse Communication: Mobility status PT Visit Diagnosis: Difficulty in walking, not elsewhere classified (R26.2);Other abnormalities of gait and mobility (R26.89)    Time: 1440-1502 PT Time Calculation (min) (ACUTE ONLY): 22 min   Charges:   PT Evaluation $PT Eval Moderate Complexity: 1 Mod   PT General Charges $$ ACUTE PT VISIT: 1 Visit         10/19/2022  Michael Bell., PT Acute Rehabilitation Services [phone]  (office)  Michael Bell 10/19/2022, 3:11 PM

## 2022-10-19 NOTE — Progress Notes (Addendum)
Subjective: Mr. Rounds is a 62 yo M with pertinent PMHx of NSTEMI, HFrEF (40-45) in 2023, HTN, and tobacco and alcohol use who presented with chest pain and SOB found to have new onset Afib with RVR and acute heart failure exacerbation  Continues to endorse chest tightness that feels like weight on top of his chest. He reports occasional SOB when laying down flat or exerting himself. On tele, he continues to remain in atrial fib. He has responded well to IV diuresis with UOP > 4L. HRs continue to fluctuate from 40s at midnight to as high 140s during the day.      Objective: Vital signs in last 24 hours: Vitals:   10/19/22 0652 10/19/22 0700 10/19/22 0807 10/19/22 1218  BP:   116/86 100/82  Pulse: (!) 149 (S) (!) 152 (!) 121 (!) 50  Resp: (!) 24 20    Temp:    98.3 F (36.8 C)  TempSrc:    Oral  SpO2: 94% 98%  96%  Weight:      Height:       Weight change: -6.915 kg  Intake/Output Summary (Last 24 hours) at 10/19/2022 1253 Last data filed at 10/19/2022 1139 Gross per 24 hour  Intake 640.04 ml  Output 2650 ml  Net -2009.96 ml   Physical exam:   BP 100/82 (BP Location: Right Arm)   Pulse (!) 50   Temp 98.3 F (36.8 C) (Oral)   Resp 20   Ht 6\' 3"  (1.905 m)   Wt 111.9 kg   SpO2 96%   BMI 30.84 kg/m  General appearance: alert, cooperative, and no distress Head: Normocephalic, without obvious abnormality, atraumatic Eyes: conjunctivae/corneas clear. EOM's intact.  Lungs:  crackles present at bases bilaterally Heart: irregularly irregular rhythm, tachycardia, no m/r/g Abdomen:  soft, distended abdomen. Non-tender on palpation Extremities: trace non-pitting edema bilaterally, warm to touch  Lab Results:    Latest Ref Rng & Units 10/19/2022   12:07 AM 10/18/2022    1:39 AM 10/18/2022    1:16 AM  CBC  WBC 4.0 - 10.5 K/uL 10.9   11.3   Hemoglobin 13.0 - 17.0 g/dL 16.1  09.6  04.5   Hematocrit 39.0 - 52.0 % 42.2  40.0  41.0   Platelets 150 - 400 K/uL 260   237       Latest  Ref Rng & Units 10/19/2022   12:07 AM 10/18/2022    4:45 AM 10/18/2022    1:39 AM  CMP  Glucose 70 - 99 mg/dL 409   811   BUN 8 - 23 mg/dL 27   26   Creatinine 9.14 - 1.24 mg/dL 7.82   9.56   Sodium 213 - 145 mmol/L 139   143   Potassium 3.5 - 5.1 mmol/L 3.0   4.0   Chloride 98 - 111 mmol/L 104   110   CO2 22 - 32 mmol/L 23     Calcium 8.9 - 10.3 mg/dL 8.5     Total Protein 6.5 - 8.1 g/dL  7.7    Total Bilirubin 0.3 - 1.2 mg/dL  0.8    Alkaline Phos 38 - 126 U/L  83    AST 15 - 41 U/L  17    ALT 0 - 44 U/L  22     No results for input(s): "GLUCAP" in the last 72 hours. Micro Results: No results found for this or any previous visit (from the past 240 hour(s)). Studies/Results: ECHOCARDIOGRAM COMPLETE  Result Date:  10/18/2022    ECHOCARDIOGRAM REPORT   Patient Name:   Michael Bell Date of Exam: 10/18/2022 Medical Rec #:  [id number]    Height:       75.0 in Accession #:    [id number]   Weight:       261.2 lb Date of Birth:  Sep 07, 1960    BSA:          2.458 m Patient Age:    62 years     BP:           161/82 mmHg Patient Gender: M            HR:           92 bpm. Exam Location:  Inpatient Procedure: Cardiac Doppler, Color Doppler and 2D Echo Indications:     A fib  History:         Patient has prior history of Echocardiogram examinations, most                  recent 08/05/2021. Risk Factors:Hypertension and Current Smoker.  Sonographer:     Delcie Roch RDCS Referring Phys:  [id number] GRACE LAU Diagnosing Phys: Thurmon Fair MD IMPRESSIONS  1. Left ventricular ejection fraction, by estimation, is 35 to 40%. The left ventricle has moderately decreased function. The left ventricle demonstrates global hypokinesis. The left ventricular internal cavity size was mildly dilated. There is mild eccentric left ventricular hypertrophy. Left ventricular diastolic function could not be evaluated.  2. Right ventricular systolic function is normal. The right ventricular size is mildly enlarged. Tricuspid  regurgitation signal is inadequate for assessing PA pressure.  3. Left atrial size was severely dilated.  4. Right atrial size was severely dilated.  5. The mitral valve is normal in structure. No evidence of mitral valve regurgitation.  6. The aortic valve is tricuspid. There is mild calcification of the aortic valve. Aortic valve regurgitation is not visualized. Aortic valve sclerosis is present, with no evidence of aortic valve stenosis.  7. The inferior vena cava is dilated in size with >50% respiratory variability, suggesting right atrial pressure of 8 mmHg. Comparison(s): No significant change from prior study. FINDINGS  Left Ventricle: Left ventricular ejection fraction, by estimation, is 35 to 40%. The left ventricle has moderately decreased function. The left ventricle demonstrates global hypokinesis. The left ventricular internal cavity size was mildly dilated. There is mild eccentric left ventricular hypertrophy. Left ventricular diastolic function could not be evaluated due to nondiagnostic images. Left ventricular diastolic function could not be evaluated. Right Ventricle: The right ventricular size is mildly enlarged. No increase in right ventricular wall thickness. Right ventricular systolic function is normal. Tricuspid regurgitation signal is inadequate for assessing PA pressure. Left Atrium: Left atrial size was severely dilated. Right Atrium: Right atrial size was severely dilated. Pericardium: There is no evidence of pericardial effusion. Mitral Valve: The mitral valve is normal in structure. No evidence of mitral valve regurgitation. Tricuspid Valve: The tricuspid valve is normal in structure. Tricuspid valve regurgitation is not demonstrated. Aortic Valve: The aortic valve is tricuspid. There is mild calcification of the aortic valve. Aortic valve regurgitation is not visualized. Aortic valve sclerosis is present, with no evidence of aortic valve stenosis. Pulmonic Valve: The pulmonic valve was  normal in structure. Pulmonic valve regurgitation is not visualized. No evidence of pulmonic stenosis. Aorta: The aortic root and ascending aorta are structurally normal, with no evidence of dilitation. Venous: The inferior vena cava is dilated in size with greater  than 50% respiratory variability, suggesting right atrial pressure of 8 mmHg. IAS/Shunts: No atrial level shunt detected by color flow Doppler.  LEFT VENTRICLE PLAX 2D LVIDd:         6.25 cm LVIDs:         4.90 cm LV PW:         1.35 cm LV IVS:        1.25 cm LVOT diam:     2.40 cm LV SV:         80 LV SV Index:   32 LVOT Area:     4.52 cm  RIGHT VENTRICLE          IVC RV Basal diam:  4.10 cm  IVC diam: 2.70 cm TAPSE (M-mode): 1.7 cm LEFT ATRIUM              Index        RIGHT ATRIUM           Index LA diam:        5.80 cm  2.36 cm/m   RA Area:     31.50 cm LA Vol (A2C):   133.0 ml 54.10 ml/m  RA Volume:   114.00 ml 46.38 ml/m LA Vol (A4C):   118.0 ml 48.00 ml/m LA Biplane Vol: 126.0 ml 51.26 ml/m  AORTIC VALVE LVOT Vmax:   101.00 cm/s LVOT Vmean:  61.200 cm/s LVOT VTI:    0.176 m  AORTA Ao Root diam: 3.10 cm Ao Asc diam:  3.80 cm  SHUNTS Systemic VTI:  0.18 m Systemic Diam: 2.40 cm Rachelle Hora Croitoru MD Electronically signed by Thurmon Fair MD Signature Date/Time: 10/18/2022/3:42:24 PM    Final (Updated)    CT Angio Chest PE W and/or Wo Contrast  Result Date: 10/18/2022 CLINICAL DATA:  Pulmonary embolism (PE) suspected, high prob. Chest pain EXAM: CT ANGIOGRAPHY CHEST WITH CONTRAST TECHNIQUE: Multidetector CT imaging of the chest was performed using the standard protocol during bolus administration of intravenous contrast. Multiplanar CT image reconstructions and MIPs were obtained to evaluate the vascular anatomy. RADIATION DOSE REDUCTION: This exam was performed according to the departmental dose-optimization program which includes automated exposure control, adjustment of the mA and/or kV according to patient size and/or use of iterative  reconstruction technique. CONTRAST:  75mL OMNIPAQUE IOHEXOL 350 MG/ML SOLN COMPARISON:  01/11/2021 FINDINGS: Cardiovascular: No filling defects in the pulmonary arteries to suggest pulmonary emboli. Cardiomegaly. Scattered coronary artery and aortic calcifications. No evidence of aortic aneurysm. Mediastinum/Nodes: Enlarging right paratracheal lymph node with a short axis diameter of 3 cm compared to 1.8 cm previously. New subcarinal adenopathy with a short axis diameter of 1.7 cm. AP window and prevascular lymph nodes borderline or mildly enlarged. Mildly enlarged right hilar lymph node with a short axis diameter of 1.6 cm, new since prior study. Borderline left hilar lymph node with a short axis diameter of 1.2 cm. Trachea and esophagus are unremarkable. Thyroid unremarkable. Lungs/Pleura: Centrilobular and paraseptal emphysema. Small bilateral pleural effusions. Compressive atelectasis in the lower lobes. Mild vascular congestion. Upper Abdomen: No acute findings in the upper abdomen. Musculoskeletal: Chest wall soft tissues are unremarkable. No acute bony abnormality. Review of the MIP images confirms the above findings. IMPRESSION: No evidence of pulmonary embolus. Enlarging mediastinal and bilateral hilar lymph nodes. Cannot exclude metastasis or lymphoma. Consider further evaluation with PET CT. Small bilateral pleural effusions.  Bibasilar atelectasis. Cardiomegaly, vascular congestion. Coronary artery disease. Aortic Atherosclerosis (ICD10-I70.0). Electronically Signed   By: Charlett Nose M.D.  On: 10/18/2022 02:23   Medications: I have reviewed the patient's current medications. Scheduled Meds:  amiodarone  150 mg Intravenous Once   amLODipine  5 mg Oral Daily   aspirin EC  81 mg Oral Daily   atorvastatin  80 mg Oral Daily   dapagliflozin propanediol  5 mg Oral Daily   furosemide  40 mg Intravenous Q12H   isosorbide mononitrate  60 mg Oral Daily   metoprolol succinate  25 mg Oral Daily    sacubitril-valsartan  1 tablet Oral BID   Continuous Infusions:  amiodarone     Followed by   amiodarone     heparin 2,050 Units/hr (10/19/22 0327)   PRN Meds:.acetaminophen **OR** acetaminophen, hydrOXYzine, mouth rinse, polyethylene glycol Assessment/Plan: Principal Problem:   Atrial fibrillation with RVR (HCC) Active Problems:   HTN, goal below 130/80   Tobacco user   Acute on chronic systolic heart failure (HCC)   Alcohol use   Coronary artery disease involving native coronary artery of native heart   Acute on chronic congestive heart failure [facility])  Mr. Michael Bell is a 62 yo M with pertinent PMHx of NSTEMI, HFrEF (40-45) in 2023, HTN, and tobacco and alcohol use who presented with chest pain and SOB found to have new onset Afib with RVR and acute heart failure exacerbation  #Afib w/ RVR CHADSVAC score: 3 (HTN, NSTEMI, HFrEF). Possible etiologies include substance use history (ETOH, tobacco), hypertension, and concomitant uncontrolled HF. HRs continue to fluctuate from 40s at midnight to as high 140s during the day.     -Cardiology following, appreciate recs as discussed below:  -Start IV amiodarone infusion with 150mg  amiodarone load followed by 60mg  maintenance for 6 hours then titrated to 30mg  continuous  -Continue hep gtt, plan for heart catheterization on 7/25 -Switch from heparin to Eliquis 5mg  BID following heart cath -NPO at midnight   #Acute on Chronic Heart Failure Exacerbation (EF 35-40%) #CHF NYHA class III-IV Secondary to medication non-adherence over the last 6 months due to financial hardship, uncontrolled HTN, and new onset Atrial fib with RVR. BNP elevated to 415.6 on admission. Prior ECHO done 08/05/2021 showed EF 40-45% with mildly reduced LV wall function. Repeat ECHO showed reducing EF 35-40% with elevated RA pressures and biatrial dilatation (7/23).    -See cardiology recs below: -Reduce IV Lasix 40mg  from BID to daily  -Continue GDMT as follows: -Hold Entresto  97-103mg  BID iso rising Cr, resume following heart cath   -Cont Farxiga 5mg  daily -Start metoprolol succinate 25mg  daily  -Cont Imdur 60mg  daily per cards -Strict I/Os, daily weights -Mg level -PT eval   #Elevated tropnonin  H/o CAD #C/f Angina LDL 103, has not taken home medications. Troponins: 42 --> 47 --> 40. Initial EKG showed Afib with some ST segment and T wave abnormalities c/f ischemia. Cath on 08/05/21 showed PDA occlusion and ulceration and up to 90% stenosis of the RCA which was medically managed. Continues to endorse chest heaviness. Suspect elevated troponins 2/2 non-adherence with medications and acute HF exacerbation.  -Cont atorvastatin 80 mg daily -Cont bASA -Cont amlodipine 5mg  daily    #Enlarging hilar LAD  CT angio chest PE showed no evidence of an embolus but rather an enlarging mediastinal and bilateral hilar lymph nodes. Cannot exclude metastatic disease or lymphoma, with recommendation for further work up with PET scan. Patient is lifelong tobacco user and not up to date on any of his cancer screenings with extensive family history of cancer. No B symptoms -Outpatient work-up -Wean O2  as tolerated, currently sats high 90s with 3L   #Hypokalemia K 3.0. Receiving oral potassium chloride and 10 mEq IV potassium chloride every hour for 4 doses  -Daily BMP    #Emphysema  #Tobacco use disorder #Alcohol use  Not currently on outpatient therapy. Continues to smoke. Denies nicotine replacement therapies -Discuss affordable outpatient therapies -Encourage smoking cessation   #Uncontrolled HTN  Has been unable to take home medications due to financial difficulties. -Continue antihypertensives as stated above    #H/o AAA Reported infrarenal abdominal aortic aneurysm 3.7 cm on U/S on 07/2021. Recommend follow-up ultrasound every 2 years -Outpatient follow-up   Diet: Heart Healthy VTE: Heparin IVF: None, Code: Full   Prior to Admission Living Arrangement:  Home, with spouse.  Anticipated Discharge Location: Home Barriers to Discharge: medical workup   Dispo: Admit patient to Inpatient with expected length of stay greater than 2 midnights  This is a Psychologist, occupational Note.  The care of the patient was discussed with Dr. Sol Blazing and the assessment and plan formulated with their assistance.  Please see their attached note for official documentation of the daily encounter.   LOS: 1 day   Loyal Buba 10/19/2022, 12:53 PM

## 2022-10-19 NOTE — Progress Notes (Signed)
ANTICOAGULATION CONSULT NOTE - Follow-up Note  Pharmacy Consult for Heparin Indication: atrial fibrillation  No Known Allergies  Patient Measurements: Height: 6\' 3"  (190.5 cm) Weight: 111.6 kg (246 lb) IBW/kg (Calculated) : 84.5 Heparin Dosing Weight: 109.5 kg  Vital Signs: Temp: 98.8 F (37.1 C) (07/23 2311) Temp Source: Oral (07/23 2311) BP: 136/93 (07/23 2311) Pulse Rate: 125 (07/23 2311)  Labs: Recent Labs    10/18/22 0116 10/18/22 0139 10/18/22 0320 10/18/22 0948 10/18/22 1750 10/19/22 0007  HGB 13.3 13.6  --   --   --  13.7  HCT 41.0 40.0  --   --   --  42.2  PLT 237  --   --   --   --  260  HEPARINUNFRC  --   --   --  0.13* 0.38 0.40  CREATININE 1.30* 1.40*  --   --   --   --   TROPONINIHS 42*  --  47* 40*  --   --     Estimated Creatinine Clearance: 73.7 mL/min (A) (by C-G formula based on SCr of 1.4 mg/dL (H)).   Medical History: Past Medical History:  Diagnosis Date   Acute diastolic heart failure (HCC)    Arthritis    CHF (congestive heart failure) (HCC)    Headache    Hypertension    Hypertensive urgency     Medications:  Medications Prior to Admission  Medication Sig Dispense Refill Last Dose   albuterol (VENTOLIN HFA) 108 (90 Base) MCG/ACT inhaler Inhale 1-2 puffs into the lungs every 4 (four) hours as needed for wheezing or shortness of breath. (Patient not taking: Reported on 10/18/2022) 8.5 g 2 Not Taking   aspirin EC 81 MG tablet Take 1 tablet (81 mg total) by mouth daily. (Patient not taking: Reported on 10/18/2022) 30 tablet 5 Not Taking   atorvastatin (LIPITOR) 80 MG tablet Take 1 tablet (80 mg total) by mouth at bedtime. (Patient not taking: Reported on 10/18/2022) 30 tablet 2 Not Taking   carvedilol (COREG) 25 MG tablet Take 1 tablet (25 mg total) by mouth 2 (two) times daily with a meal. (Patient not taking: Reported on 10/18/2022) 60 tablet 2 Not Taking   clonazePAM (KLONOPIN) 0.5 MG tablet Take 1 tablet (0.5 mg total) by mouth 2 (two)  times daily for 14 days. (Patient not taking: Reported on 10/18/2022) 28 tablet 0 Not Taking   clopidogrel (PLAVIX) 75 MG tablet Take 1 tablet (75 mg total) by mouth daily. (Patient not taking: Reported on 10/18/2022) 30 tablet 2 Not Taking   dapagliflozin propanediol (FARXIGA) 10 MG TABS tablet Take 1 tablet (10 mg total) by mouth daily before breakfast. (Patient not taking: Reported on 10/18/2022) 90 tablet 0 Not Taking   escitalopram (LEXAPRO) 10 MG tablet Take 1 tablet (10 mg total) by mouth daily. (Patient not taking: Reported on 10/18/2022) 30 tablet 1 Not Taking   fluticasone (FLONASE) 50 MCG/ACT nasal spray Place 2 sprays into both nostrils daily. (Patient not taking: Reported on 10/18/2022) 16 g 2 Not Taking   furosemide (LASIX) 40 MG tablet Take 1 tablet (40 mg total) by mouth daily. (Patient not taking: Reported on 10/18/2022) 90 tablet 3 Not Taking   ranolazine (RANEXA) 500 MG 12 hr tablet Take 1 tablet (500 mg total) by mouth 2 (two) times daily. (Patient not taking: Reported on 10/18/2022) 180 tablet 0 Not Taking   sacubitril-valsartan (ENTRESTO) 97-103 MG Take 1 tablet by mouth 2 (two) times daily. (Patient not taking: Reported on  10/18/2022) 180 tablet 3 Not Taking   umeclidinium-vilanterol (ANORO ELLIPTA) 62.5-25 MCG/ACT AEPB Inhale 1 puff into the lungs daily. (Patient not taking: Reported on 10/18/2022) 60 each 3 Not Taking   Scheduled:   amLODipine  5 mg Oral Daily   aspirin EC  81 mg Oral Daily   atorvastatin  80 mg Oral Daily   dapagliflozin propanediol  5 mg Oral Daily   furosemide  40 mg Intravenous Q12H   isosorbide mononitrate  60 mg Oral Daily   sacubitril-valsartan  1 tablet Oral BID   Infusions:   heparin 2,050 Units/hr (10/18/22 2100)   PRN: acetaminophen **OR** acetaminophen, hydrOXYzine, mouth rinse, polyethylene glycol  Assessment: 62 yom with a history of H/O NSTEMI, CHF, HA, HTN, and Hypertensive urgency. Heparin per pharmacy consult placed for atrial  fibrillation.  Patient was not on anticoagulation prior to arrival. Started on 1600 units/hr IV heparin following 5000 unit IV heparin bolus. Resulting heparin level was 0.13 which is sub-therapeutic.  Patient re-bolused and increased to 2050 units/hr. Re-check this evening is 0.38 which is therapeutic.  No issues with infusion or bleeding per RN.  Hgb 13.6; plt 237  7/24 AM: heparin level therapeutic at 0.4. no signs/symptoms of bleed reported or issues with heparin infusion. Will continue current rate  Goal of Therapy:  Heparin level 0.3-0.7 units/ml Monitor platelets by anticoagulation protocol: Yes   Plan:  Continue heparin infusion at 2050 units/hr Check anti-Xa level daily while on heparin Continue to monitor H&H and platelets  Arabella Merles, PharmD. Clinical Pharmacist 10/19/2022 12:56 AM

## 2022-10-19 NOTE — H&P (View-Only) (Signed)
Subjective:  Breathing still labored  Diuresed well Afib rates 40s-150s   Current Facility-Administered Medications:    acetaminophen (TYLENOL) tablet 650 mg, 650 mg, Oral, Q6H PRN, 650 mg at 10/18/22 1739 **OR** acetaminophen (TYLENOL) suppository 650 mg, 650 mg, Rectal, Q6H PRN, Champ Mungo, DO   amLODipine (NORVASC) tablet 5 mg, 5 mg, Oral, Daily, Shareeka Yim J, MD, 5 mg at 10/19/22 9604   aspirin EC tablet 81 mg, 81 mg, Oral, Daily, Champ Mungo, DO, 81 mg at 10/19/22 0807   atorvastatin (LIPITOR) tablet 80 mg, 80 mg, Oral, Daily, Champ Mungo, DO, 80 mg at 10/19/22 0807   dapagliflozin propanediol (FARXIGA) tablet 5 mg, 5 mg, Oral, Daily, Rilen Shukla J, MD, 5 mg at 10/18/22 1103   furosemide (LASIX) injection 40 mg, 40 mg, Intravenous, Q12H, Champ Mungo, DO, 40 mg at 10/19/22 0528   heparin ADULT infusion 100 units/mL (25000 units/245mL), 2,050 Units/hr, Intravenous, Continuous, Dickie La, MD, Last Rate: 20.5 mL/hr at 10/19/22 0327, 2,050 Units/hr at 10/19/22 0327   hydrOXYzine (ATARAX) tablet 25 mg, 25 mg, Oral, TID PRN, Atway, Rayann N, DO, 25 mg at 10/18/22 1738   isosorbide mononitrate (IMDUR) 24 hr tablet 60 mg, 60 mg, Oral, Daily, Silvina Hackleman J, MD, 60 mg at 10/19/22 5409   magnesium sulfate IVPB 2 g 50 mL, 2 g, Intravenous, Once, Gwenevere Abbot, MD, Last Rate: 50 mL/hr at 10/19/22 0803, 2 g at 10/19/22 0803   Oral care mouth rinse, 15 mL, Mouth Rinse, PRN, Dickie La, MD   polyethylene glycol (MIRALAX / GLYCOLAX) packet 17 g, 17 g, Oral, Daily PRN, Champ Mungo, DO   potassium chloride 10 mEq in 100 mL IVPB, 10 mEq, Intravenous, Q1 Hr x 4, Gwenevere Abbot, MD   sacubitril-valsartan (ENTRESTO) 97-103 mg per tablet, 1 tablet, Oral, BID, Annett Fabian, MD, 1 tablet at 10/19/22 8119   Objective:  Vital Signs in the last 24 hours: Temp:  [97.6 F (36.4 C)-98.8 F (37.1 C)] 98.2 F (36.8 C) (07/24 1478) Pulse Rate:  [35-159] 121 (07/24 0807) Resp:  [11-36] 20  (07/24 0700) BP: (106-185)/(78-134) 116/86 (07/24 0807) SpO2:  [88 %-100 %] 98 % (07/24 0700) Weight:  [111.6 kg-111.9 kg] 111.9 kg (07/24 0328)  Intake/Output from previous day: 07/23 0701 - 07/24 0700 In: 616 [P.O.:300; I.V.:316] Out: 4325 [Urine:4325]  Physical Exam Vitals and nursing note reviewed.  Constitutional:      General: He is not in acute distress. Neck:     Vascular: No JVD.  Cardiovascular:     Rate and Rhythm: Tachycardia present. Rhythm irregular.     Heart sounds: Normal heart sounds. No murmur heard. Pulmonary:     Effort: Pulmonary effort is normal.     Breath sounds: Examination of the right-lower field reveals decreased breath sounds. Examination of the left-lower field reveals decreased breath sounds. Decreased breath sounds present. No wheezing or rales.  Musculoskeletal:     Right lower leg: No edema.     Left lower leg: No edema.      Imaging/tests reviewed and independently interpreted:   CTA chest 10/18/2022: No evidence of pulmonary embolus. Enlarging mediastinal and bilateral hilar lymph nodes. Cannot exclude metastasis or lymphoma. Consider further evaluation with PET CT. Small bilateral pleural effusions.  Bibasilar atelectasis. Cardiomegaly, vascular congestion. Coronary artery disease. Aortic Atherosclerosis (ICD10-I70.0).  Cardiac Studies:  Telemetry 10/19/2022: Afib, rates ranging from 140s during activity, to 40s during sleep  EKG 10/18/2022: Atrial fibrillation with rapid ventricular response Minimal voltage criteria for LVH,  may be normal variant ( Cornell product ) ST & T wave abnormality, consider lateral ischemia Confirmed by Nicanor Alcon, April ([id number]) on 10/18/2022 1:17:36    Echocardiogram 10/18/2022:  1. Left ventricular ejection fraction, by estimation, is 35 to 40%. The  left ventricle has moderately decreased function. The left ventricle  demonstrates global hypokinesis. The left ventricular internal cavity size  was  mildly dilated. There is mild eccentric left ventricular hypertrophy.  Left ventricular diastolic function could not be evaluated.   2. Right ventricular systolic function is normal. The right ventricular  size is mildly enlarged. Tricuspid regurgitation signal is inadequate for  assessing PA pressure.   3. Left atrial size was severely dilated.   4. Right atrial size was severely dilated.   5. The mitral valve is normal in structure. No evidence of mitral valve  regurgitation.   6. The aortic valve is tricuspid. There is mild calcification of the  aortic valve. Aortic valve regurgitation is not visualized. Aortic valve  sclerosis is present, with no evidence of aortic valve stenosis.   7. The inferior vena cava is dilated in size with >50% respiratory  variability, suggesting right atrial pressure of 8 mmHg.   Comparison(s): No significant change from prior study.     Assessment & Recommendations:  62 y.o. 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 Acute on chronic HFrEF in the setting of uncontrolled hypertension, medication noncompliance, underlying CAD. Aggressive diuresis, blood pressure control.  Net -ve 4.5 L. Reduce IV lasix to 40 mg daily. Continue strict I/O. Hold Entresto today with Cr rising and potential plan for heart catheterization, likely tomorrow 7/25. This can be resumed after cath. Start metoprolol succinate 25 mg daily. Continue Farxiga 5 mg daily,  CAD: Known CAD with PDA occlusion causing NSTEMI in 07/2021. No other consequential obstructive CAD noted at that time. His current chest pain is concerning for angina, but also comes with uncontrolled hypertension and medication noncompliance.  Troponin elevation not  consistent with ACS, likely due to heart failure. Treat angina as stable angina for now with medical therapy. With further drop in EF and angina with rapid RVR episodes, we could consider inpatient left and right heart catheterization. He is not able to lay flat for extended period of time due to orthopnea yet. Also, Cr has gone up today. Hold Entresto, reduce lasix to 40 mg daily and reassess for possible heart catheterization on 7/25. Continue Imdur 60 mg daily, amlodipine 5 mg daily.  Added metoprolol succinate 25 mg daily (7/24).    Afib: Going by severe biatrial dilatation, this is likely longstanding persistent, only diagnosed now. Rates ranging from 150s with minimal activity to 40s during sleep.  Start IV amiodarone today, along with metoprolol succinate 25 mg daily (7/24/). CHA2DS2VASc score 3, annual stroke risk 3.2%. Continue IV heparin for now. Change to DOAC-Eliquis 5 mg bid after heart catheterization.   Hilar lymphadenopathy: Noted on CT chest. He does endorse constitutional symptoms of subjective fever to me. If no infectious cause suspected, consider workup for lymphoma. Defer to primary team.    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 Will need outpatient follow up.   Tobacco, alcohol abuse: Consider nicotine patch. (2 PPD) May need CIWA protocol (6 beers/day). Defer to primary team.   Discussed interpretation  of tests and management recommendations with the primary team   Discussed interpretation of tests and management recommendations with the primary team   Elder Negus, MD Pager: [phone] Office: [phone]

## 2022-10-19 NOTE — Progress Notes (Signed)
We had been in communication with Dr. Hassan Rowan and her team about Pt's HR stays above 120-149 , Atrial fib with RVR which triggered by ambulation on his bed., BP 109/94, with a complaint of chest tightness.  MD suggested to continue to monitor. No new order received if HR lower than 150.  Last night while Pt was sleeping, sometimes his EKG was unsustained slow Afib, HR 34-45. MD aware. We will monitor.   Michael Pinks, RN

## 2022-10-19 NOTE — Progress Notes (Signed)
Subjective:  Breathing still labored  Diuresed well Afib rates 40s-150s   Current Facility-Administered Medications:    acetaminophen (TYLENOL) tablet 650 mg, 650 mg, Oral, Q6H PRN, 650 mg at 10/18/22 1739 **OR** acetaminophen (TYLENOL) suppository 650 mg, 650 mg, Rectal, Q6H PRN, Champ Mungo, DO   amLODipine (NORVASC) tablet 5 mg, 5 mg, Oral, Daily, Lalitha Ilyas J, MD, 5 mg at 10/19/22 9604   aspirin EC tablet 81 mg, 81 mg, Oral, Daily, Champ Mungo, DO, 81 mg at 10/19/22 0807   atorvastatin (LIPITOR) tablet 80 mg, 80 mg, Oral, Daily, Champ Mungo, DO, 80 mg at 10/19/22 0807   dapagliflozin propanediol (FARXIGA) tablet 5 mg, 5 mg, Oral, Daily, Garold Sheeler J, MD, 5 mg at 10/18/22 1103   furosemide (LASIX) injection 40 mg, 40 mg, Intravenous, Q12H, Champ Mungo, DO, 40 mg at 10/19/22 0528   heparin ADULT infusion 100 units/mL (25000 units/245mL), 2,050 Units/hr, Intravenous, Continuous, Dickie La, MD, Last Rate: 20.5 mL/hr at 10/19/22 0327, 2,050 Units/hr at 10/19/22 0327   hydrOXYzine (ATARAX) tablet 25 mg, 25 mg, Oral, TID PRN, Atway, Rayann N, DO, 25 mg at 10/18/22 1738   isosorbide mononitrate (IMDUR) 24 hr tablet 60 mg, 60 mg, Oral, Daily,  Williamsen J, MD, 60 mg at 10/19/22 5409   magnesium sulfate IVPB 2 g 50 mL, 2 g, Intravenous, Once, Gwenevere Abbot, MD, Last Rate: 50 mL/hr at 10/19/22 0803, 2 g at 10/19/22 0803   Oral care mouth rinse, 15 mL, Mouth Rinse, PRN, Dickie La, MD   polyethylene glycol (MIRALAX / GLYCOLAX) packet 17 g, 17 g, Oral, Daily PRN, Champ Mungo, DO   potassium chloride 10 mEq in 100 mL IVPB, 10 mEq, Intravenous, Q1 Hr x 4, Gwenevere Abbot, MD   sacubitril-valsartan (ENTRESTO) 97-103 mg per tablet, 1 tablet, Oral, BID, Annett Fabian, MD, 1 tablet at 10/19/22 8119   Objective:  Vital Signs in the last 24 hours: Temp:  [97.6 F (36.4 C)-98.8 F (37.1 C)] 98.2 F (36.8 C) (07/24 1478) Pulse Rate:  [35-159] 121 (07/24 0807) Resp:  [11-36] 20  (07/24 0700) BP: (106-185)/(78-134) 116/86 (07/24 0807) SpO2:  [88 %-100 %] 98 % (07/24 0700) Weight:  [111.6 kg-111.9 kg] 111.9 kg (07/24 0328)  Intake/Output from previous day: 07/23 0701 - 07/24 0700 In: 616 [P.O.:300; I.V.:316] Out: 4325 [Urine:4325]  Physical Exam Vitals and nursing note reviewed.  Constitutional:      General: He is not in acute distress. Neck:     Vascular: No JVD.  Cardiovascular:     Rate and Rhythm: Tachycardia present. Rhythm irregular.     Heart sounds: Normal heart sounds. No murmur heard. Pulmonary:     Effort: Pulmonary effort is normal.     Breath sounds: Examination of the right-lower field reveals decreased breath sounds. Examination of the left-lower field reveals decreased breath sounds. Decreased breath sounds present. No wheezing or rales.  Musculoskeletal:     Right lower leg: No edema.     Left lower leg: No edema.      Imaging/tests reviewed and independently interpreted:   CTA chest 10/18/2022: No evidence of pulmonary embolus. Enlarging mediastinal and bilateral hilar lymph nodes. Cannot exclude metastasis or lymphoma. Consider further evaluation with PET CT. Small bilateral pleural effusions.  Bibasilar atelectasis. Cardiomegaly, vascular congestion. Coronary artery disease. Aortic Atherosclerosis (ICD10-I70.0).  Cardiac Studies:  Telemetry 10/19/2022: Afib, rates ranging from 140s during activity, to 40s during sleep  EKG 10/18/2022: Atrial fibrillation with rapid ventricular response Minimal voltage criteria for LVH,  may be normal variant ( Cornell product ) ST & T wave abnormality, consider lateral ischemia Confirmed by Nicanor Alcon, April ([id number]) on 10/18/2022 1:17:36    Echocardiogram 10/18/2022:  1. Left ventricular ejection fraction, by estimation, is 35 to 40%. The  left ventricle has moderately decreased function. The left ventricle  demonstrates global hypokinesis. The left ventricular internal cavity size  was  mildly dilated. There is mild eccentric left ventricular hypertrophy.  Left ventricular diastolic function could not be evaluated.   2. Right ventricular systolic function is normal. The right ventricular  size is mildly enlarged. Tricuspid regurgitation signal is inadequate for  assessing PA pressure.   3. Left atrial size was severely dilated.   4. Right atrial size was severely dilated.   5. The mitral valve is normal in structure. No evidence of mitral valve  regurgitation.   6. The aortic valve is tricuspid. There is mild calcification of the  aortic valve. Aortic valve regurgitation is not visualized. Aortic valve  sclerosis is present, with no evidence of aortic valve stenosis.   7. The inferior vena cava is dilated in size with >50% respiratory  variability, suggesting right atrial pressure of 8 mmHg.   Comparison(s): No significant change from prior study.     Assessment & Recommendations:  62 y.o. 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 Acute on chronic HFrEF in the setting of uncontrolled hypertension, medication noncompliance, underlying CAD. Aggressive diuresis, blood pressure control.  Net -ve 4.5 L. Reduce IV lasix to 40 mg daily. Continue strict I/O. Hold Entresto today with Cr rising and potential plan for heart catheterization, likely tomorrow 7/25. This can be resumed after cath. Start metoprolol succinate 25 mg daily. Continue Farxiga 5 mg daily,  CAD: Known CAD with PDA occlusion causing NSTEMI in 07/2021. No other consequential obstructive CAD noted at that time. His current chest pain is concerning for angina, but also comes with uncontrolled hypertension and medication noncompliance.  Troponin elevation not  consistent with ACS, likely due to heart failure. Treat angina as stable angina for now with medical therapy. With further drop in EF and angina with rapid RVR episodes, we could consider inpatient left and right heart catheterization. He is not able to lay flat for extended period of time due to orthopnea yet. Also, Cr has gone up today. Hold Entresto, reduce lasix to 40 mg daily and reassess for possible heart catheterization on 7/25. Continue Imdur 60 mg daily, amlodipine 5 mg daily.  Added metoprolol succinate 25 mg daily (7/24).    Afib: Going by severe biatrial dilatation, this is likely longstanding persistent, only diagnosed now. Rates ranging from 150s with minimal activity to 40s during sleep.  Start IV amiodarone today, along with metoprolol succinate 25 mg daily (7/24/). CHA2DS2VASc score 3, annual stroke risk 3.2%. Continue IV heparin for now. Change to DOAC-Eliquis 5 mg bid after heart catheterization.   Hilar lymphadenopathy: Noted on CT chest. He does endorse constitutional symptoms of subjective fever to me. If no infectious cause suspected, consider workup for lymphoma. Defer to primary team.    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 Will need outpatient follow up.   Tobacco, alcohol abuse: Consider nicotine patch. (2 PPD) May need CIWA protocol (6 beers/day). Defer to primary team.   Discussed interpretation  of tests and management recommendations with the primary team   Discussed interpretation of tests and management recommendations with the primary team   Elder Negus, MD Pager: [phone] Office: [phone]

## 2022-10-20 ENCOUNTER — Telehealth: Payer: Self-pay | Admitting: Physician Assistant

## 2022-10-20 ENCOUNTER — Encounter (HOSPITAL_COMMUNITY)
Admission: EM | Disposition: A | Discharge status: Home / Self Care | Payer: Self-pay | Source: Home / Self Care | Attending: Internal Medicine

## 2022-10-20 DIAGNOSIS — I5020 Unspecified systolic (congestive) heart failure: Secondary | ICD-10-CM

## 2022-10-20 DIAGNOSIS — Z720 Tobacco use: Secondary | ICD-10-CM

## 2022-10-20 DIAGNOSIS — R590 Localized enlarged lymph nodes: Secondary | ICD-10-CM

## 2022-10-20 HISTORY — PX: RIGHT/LEFT HEART CATH AND CORONARY ANGIOGRAPHY: CATH118266

## 2022-10-20 LAB — POCT I-STAT EG7
Acid-base deficit: 2 mmol/L (ref 0.0–2.0)
Acid-base deficit: 1 mmol/L (ref 0.0–2.0)
Bicarbonate: 23.2 mmol/L (ref 20.0–28.0)
Bicarbonate: 24.1 mmol/L (ref 20.0–28.0)
Calcium, Ion: 1.06 mmol/L — ABNORMAL LOW (ref 1.15–1.40)
Calcium, Ion: 1.11 mmol/L — ABNORMAL LOW (ref 1.15–1.40)
HCT: 40 % (ref 39.0–52.0)
HCT: 41 % (ref 39.0–52.0)
Hemoglobin: 13.6 g/dL (ref 13.0–17.0)
Hemoglobin: 13.9 g/dL (ref 13.0–17.0)
O2 Saturation: 56 %
O2 Saturation: 60 %
Potassium: 3.4 mmol/L — ABNORMAL LOW (ref 3.5–5.1)
Potassium: 3.5 mmol/L (ref 3.5–5.1)
Sodium: 141 mmol/L (ref 135–145)
Sodium: 142 mmol/L (ref 135–145)
TCO2: 24 mmol/L (ref 22–32)
TCO2: 25 mmol/L (ref 22–32)
pCO2, Ven: 41.9 mmHg — ABNORMAL LOW (ref 44–60)
pCO2, Ven: 42.5 mmHg — ABNORMAL LOW (ref 44–60)
pH, Ven: 7.351 (ref 7.25–7.43)
pH, Ven: 7.361 (ref 7.25–7.43)
pO2, Ven: 31 mmHg — CL (ref 32–45)
pO2, Ven: 32 mmHg (ref 32–45)

## 2022-10-20 LAB — BASIC METABOLIC PANEL
Anion gap: 10 (ref 5–15)
CO2: 22 mmol/L (ref 22–32)
Calcium: 8.4 mg/dL — ABNORMAL LOW (ref 8.9–10.3)
Chloride: 105 mmol/L (ref 98–111)
Creatinine, Ser: 1.37 mg/dL — ABNORMAL HIGH (ref 0.61–1.24)
GFR, Estimated: 58 mL/min — ABNORMAL LOW (ref >60)
Glucose, Bld: 96 mg/dL (ref 70–99)
Potassium: 3.5 mmol/L (ref 3.5–5.1)
Sodium: 137 mmol/L (ref 135–145)

## 2022-10-20 LAB — POCT I-STAT 7, (LYTES, BLD GAS, ICA,H+H)
Acid-base deficit: 2 mmol/L (ref 0.0–2.0)
Bicarbonate: 22.2 mmol/L (ref 20.0–28.0)
Calcium, Ion: 1.12 mmol/L — ABNORMAL LOW (ref 1.15–1.40)
HCT: 42 % (ref 39.0–52.0)
Hemoglobin: 14.3 g/dL (ref 13.0–17.0)
O2 Saturation: 92 %
Potassium: 3.6 mmol/L (ref 3.5–5.1)
Sodium: 142 mmol/L (ref 135–145)
TCO2: 23 mmol/L (ref 22–32)
pCO2 arterial: 34.6 mmHg (ref 32–48)
pH, Arterial: 7.416 (ref 7.35–7.45)
pO2, Arterial: 61 mmHg — ABNORMAL LOW (ref 83–108)

## 2022-10-20 LAB — CBC
HCT: 43.5 % (ref 39.0–52.0)
Hemoglobin: 14.1 g/dL (ref 13.0–17.0)
MCHC: 32.4 g/dL (ref 30.0–36.0)
MCV: 82.4 fL (ref 80.0–100.0)
Platelets: 277 10*3/uL (ref 150–400)
RBC: 5.28 MIL/uL (ref 4.22–5.81)
RDW: 14 % (ref 11.5–15.5)
WBC: 13.1 10*3/uL — ABNORMAL HIGH (ref 4.0–10.5)
nRBC: 0 % (ref 0.0–0.2)

## 2022-10-20 LAB — HEPARIN LEVEL (UNFRACTIONATED): Heparin Unfractionated: 0.4 IU/mL (ref 0.30–0.70)

## 2022-10-20 SURGERY — TRANSESOPHAGEAL ECHOCARDIOGRAM (TEE)
Anesthesia: Monitor Anesthesia Care

## 2022-10-20 SURGERY — RIGHT/LEFT HEART CATH AND CORONARY ANGIOGRAPHY
Anesthesia: LOCAL

## 2022-10-20 MED ORDER — SODIUM CHLORIDE 0.9% FLUSH: 3.0000 mL | INTRAVENOUS | Status: DC | PRN

## 2022-10-20 MED ORDER — FENTANYL CITRATE (PF) 100 MCG/2ML IJ SOLN
INTRAMUSCULAR | Status: AC
  Filled 2022-10-20: qty 2

## 2022-10-20 MED ORDER — IPRATROPIUM-ALBUTEROL 0.5-2.5 (3) MG/3ML IN SOLN: 3.0000 mL | RESPIRATORY_TRACT | Status: DC | PRN

## 2022-10-20 MED ORDER — FENTANYL CITRATE (PF) 100 MCG/2ML IJ SOLN
INTRAMUSCULAR | Status: DC | PRN
  Administered 2022-10-20: 25 ug via INTRAVENOUS

## 2022-10-20 MED ORDER — LABETALOL HCL 5 MG/ML IV SOLN: 10.0000 mg | INTRAVENOUS | Status: AC | PRN

## 2022-10-20 MED ORDER — LIDOCAINE HCL (PF) 1 % IJ SOLN
INTRAMUSCULAR | Status: AC
  Filled 2022-10-20: qty 30

## 2022-10-20 MED ORDER — UMECLIDINIUM-VILANTEROL 62.5-25 MCG/ACT IN AEPB
1.0000 | INHALATION_SPRAY | Freq: Every day | RESPIRATORY_TRACT | Status: DC
  Administered 2022-10-20 – 2022-10-23 (×4): 1 via RESPIRATORY_TRACT
  Filled 2022-10-20: qty 14

## 2022-10-20 MED ORDER — LIDOCAINE HCL (PF) 1 % IJ SOLN
INTRAMUSCULAR | Status: DC | PRN
  Administered 2022-10-20 (×2): 2 mL via INTRADERMAL

## 2022-10-20 MED ORDER — VERAPAMIL HCL 2.5 MG/ML IV SOLN
INTRAVENOUS | Status: AC
  Filled 2022-10-20: qty 2

## 2022-10-20 MED ORDER — VERAPAMIL HCL 2.5 MG/ML IV SOLN
INTRAVENOUS | Status: DC | PRN
  Administered 2022-10-20: 10 mL via INTRA_ARTERIAL

## 2022-10-20 MED ORDER — LORAZEPAM 1 MG PO TABS
1.0000 mg | ORAL_TABLET | Freq: Once | ORAL | Status: AC
  Administered 2022-10-20: 1 mg via ORAL
  Filled 2022-10-20: qty 1

## 2022-10-20 MED ORDER — SODIUM CHLORIDE 0.9% FLUSH
3.0000 mL | Freq: Two times a day (BID) | INTRAVENOUS | Status: DC
  Administered 2022-10-21 – 2022-10-22 (×4): 3 mL via INTRAVENOUS

## 2022-10-20 MED ORDER — ONDANSETRON HCL 4 MG/2ML IJ SOLN: 4.0000 mg | Freq: Four times a day (QID) | INTRAMUSCULAR | Status: DC | PRN

## 2022-10-20 MED ORDER — SODIUM CHLORIDE 0.9 % IV SOLN: INTRAVENOUS | Status: DC

## 2022-10-20 MED ORDER — MIDAZOLAM HCL 2 MG/2ML IJ SOLN
INTRAMUSCULAR | Status: DC | PRN
  Administered 2022-10-20: 1 mg via INTRAVENOUS

## 2022-10-20 MED ORDER — APIXABAN 5 MG PO TABS
5.0000 mg | ORAL_TABLET | Freq: Two times a day (BID) | ORAL | Status: DC
  Administered 2022-10-20 – 2022-10-23 (×7): 5 mg via ORAL
  Filled 2022-10-20 (×7): qty 1

## 2022-10-20 MED ORDER — HEPARIN SODIUM (PORCINE) 1000 UNIT/ML IJ SOLN
INTRAMUSCULAR | Status: AC
  Filled 2022-10-20: qty 10

## 2022-10-20 MED ORDER — IOHEXOL 350 MG/ML SOLN
INTRAVENOUS | Status: DC | PRN
  Administered 2022-10-20: 35 mL

## 2022-10-20 MED ORDER — AMIODARONE HCL 200 MG PO TABS
200.0000 mg | ORAL_TABLET | Freq: Two times a day (BID) | ORAL | Status: DC
  Administered 2022-10-20 – 2022-10-23 (×7): 200 mg via ORAL
  Filled 2022-10-20 (×7): qty 1

## 2022-10-20 MED ORDER — DAPAGLIFLOZIN PROPANEDIOL 10 MG PO TABS
10.0000 mg | ORAL_TABLET | Freq: Every day | ORAL | Status: DC
  Administered 2022-10-20 – 2022-10-23 (×4): 10 mg via ORAL
  Filled 2022-10-20 (×4): qty 1

## 2022-10-20 MED ORDER — HEPARIN (PORCINE) IN NACL 1000-0.9 UT/500ML-% IV SOLN
INTRAVENOUS | Status: DC | PRN
  Administered 2022-10-20 (×2): 500 mL

## 2022-10-20 MED ORDER — ASPIRIN 81 MG PO TBEC
81.0000 mg | DELAYED_RELEASE_TABLET | Freq: Every day | ORAL | Status: DC
  Administered 2022-10-21 – 2022-10-23 (×3): 81 mg via ORAL
  Filled 2022-10-20 (×3): qty 1

## 2022-10-20 MED ORDER — ACETAMINOPHEN 325 MG PO TABS
650.0000 mg | ORAL_TABLET | ORAL | Status: DC | PRN
  Administered 2022-10-21: 650 mg via ORAL

## 2022-10-20 MED ORDER — ASPIRIN 81 MG PO CHEW
81.0000 mg | CHEWABLE_TABLET | ORAL | Status: AC
  Administered 2022-10-20: 81 mg via ORAL
  Filled 2022-10-20: qty 1

## 2022-10-20 MED ORDER — APIXABAN 5 MG PO TABS: 5.0000 mg | ORAL_TABLET | Freq: Two times a day (BID) | ORAL | Status: DC

## 2022-10-20 MED ORDER — HYDRALAZINE HCL 20 MG/ML IJ SOLN: 10.0000 mg | INTRAMUSCULAR | Status: AC | PRN

## 2022-10-20 MED ORDER — HEPARIN SODIUM (PORCINE) 1000 UNIT/ML IJ SOLN
INTRAMUSCULAR | Status: DC | PRN
  Administered 2022-10-20: 5500 [IU] via INTRAVENOUS

## 2022-10-20 MED ORDER — MIDAZOLAM HCL 2 MG/2ML IJ SOLN
INTRAMUSCULAR | Status: AC
  Filled 2022-10-20: qty 2

## 2022-10-20 MED ORDER — SODIUM CHLORIDE 0.9 % IV SOLN: 250.0000 mL | INTRAVENOUS | Status: DC | PRN

## 2022-10-20 SURGICAL SUPPLY — 13 items
BAG SNAP BAND KOVER 36X36 (MISCELLANEOUS) IMPLANT
CATH 5FR JL3.5 JR4 ANG PIG MP (CATHETERS) IMPLANT
CATH BALLN WEDGE 5F 110CM (CATHETERS) IMPLANT
DEVICE RAD COMP TR BAND LRG (VASCULAR PRODUCTS) IMPLANT
GLIDESHEATH SLEND A-KIT 6F 22G (SHEATH) IMPLANT
GUIDEWIRE .025 260CM (WIRE) IMPLANT
GUIDEWIRE INQWIRE 1.5J.035X260 (WIRE) IMPLANT
INQWIRE 1.5J .035X260CM (WIRE) ×1
PACK CARDIAC CATHETERIZATION (CUSTOM PROCEDURE TRAY) ×1 IMPLANT
SET ATX-X65L (MISCELLANEOUS) IMPLANT
SHEATH GLIDE SLENDER 4/5FR (SHEATH) IMPLANT
SHEATH PROBE COVER 6X72 (BAG) IMPLANT
TUBING CIL FLEX 10 FLL-RA (TUBING) IMPLANT

## 2022-10-20 NOTE — Progress Notes (Signed)
Right and left heart catheterization 10/20/2022: LM: Normal LAD: No significant disease Ramus: No significant disease Lcx: Large, codominant. Very distal occlusion (unchanged since 07/2021) RCA: Small, codominant. 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 Okay to start PO Eliquis today. Pharmacy will time first dose after TR band is removed.  Will start PO amiodarone 200 mg bid now and wean off IV amiodarone by 4 PM. Could re introduce Entresto at lower dose (based on BP) tomorrow 7/26.    Elder Negus, MD Pager: [phone] Office: [phone]

## 2022-10-20 NOTE — Progress Notes (Signed)
Physical Therapy Treatment Patient Details Name: Michael Bell MRN: [id number] DOB: Jul 15, 1960 Today's Date: 10/20/2022   History of Present Illness 62 yo male presents to [facility] on 7/23 wit hSOB, chest pain. Workup for acute HF exacerbation and afib RVR. PMHx of NSTEMI, HFrEF (40-45) in 2023, HTN, and tobacco and alcohol use.    PT Comments  Improved over last session.  No O2 needs to maintain sats in the lower 90's%.  Mildly unsteady gait, but safe with IV pole.  Managed stairs safely with a rail.      Assistance Recommended at Discharge PRN  If plan is discharge home, recommend the following:  Can travel by private vehicle    Assistance with cooking/housework;A little help with bathing/dressing/bathroom;Help with stairs or ramp for entrance      Equipment Recommendations  None recommended by PT    Recommendations for Other Services       Precautions / Restrictions Precautions Precautions: Other (comment) (low fall risk, oxygen needs)     Mobility  Bed Mobility Overal bed mobility: Modified Independent                  Transfers Overall transfer level: Needs assistance   Transfers: Sit to/from Stand Sit to Stand: Modified independent (Device/Increase time)                Ambulation/Gait Ambulation/Gait assistance: Supervision   Assistive device: None, IV Pole Gait Pattern/deviations: Step-through pattern       General Gait Details: shuffled though generally steady gait, rest needed due to SOB, though Sats maintained >93/94%, HR labile  from 80's to high 130's in afib.   Stairs Stairs: Yes Stairs assistance: Supervision Stair Management: One rail Left, Alternating pattern Number of Stairs: 4 General stair comments: safe with rail.  Quickly fatigued   Wheelchair Mobility     Tilt Bed    Modified Rankin (Stroke Patients Only)       Balance Overall balance assessment: Modified Independent                                           Cognition Arousal/Alertness: Awake/alert Behavior During Therapy: WFL for tasks assessed/performed, Anxious Overall Cognitive Status: Within Functional Limits for tasks assessed                                 General Comments: pt did report losing his train of thought during session, anxious, a little jittery        Exercises      General Comments General comments (skin integrity, edema, etc.): Sats maintained low 90's on RA while ambulating      Pertinent Vitals/Pain      Home Living Family/patient expects to be discharged to:: Private residence Living Arrangements: Spouse/significant other Available Help at Discharge: Family;Available PRN/intermittently Type of Home: House Home Access: Level entry       Home Layout: One level Home Equipment: Cane - single point      Prior Function            PT Goals (current goals can now be found in the care plan section) Acute Rehab PT Goals Patient Stated Goal: home, after cardiology plan PT Goal Formulation: With patient Time For Goal Achievement: 10/26/22 Potential to Achieve Goals: Good Progress towards PT goals: Progressing toward goals  Frequency    Min 1X/week      PT Plan      Co-evaluation              AM-PAC PT "6 Clicks" Mobility   Outcome Measure  Help needed turning from your back to your side while in a flat bed without using bedrails?: None Help needed moving from lying on your back to sitting on the side of a flat bed without using bedrails?: None Help needed moving to and from a bed to a chair (including a wheelchair)?: None Help needed standing up from a chair using your arms (e.g., wheelchair or bedside chair)?: None Help needed to walk in hospital room?: None Help needed climbing 3-5 steps with a railing? : A Little 6 Click Score: 23    End of Session   Activity Tolerance: Patient limited by fatigue Patient left: in bed;with call bell/phone within  reach Nurse Communication: Mobility status PT Visit Diagnosis: Difficulty in walking, not elsewhere classified (R26.2);Other abnormalities of gait and mobility (R26.89)     Time: 1324-4010 PT Time Calculation (min) (ACUTE ONLY): 10 min  Charges:    $Gait Training: 8-22 mins PT General Charges $$ ACUTE PT VISIT: 1 Visit                     10/20/2022  Jacinto Halim., PT Acute Rehabilitation Services [phone]  (office)   Michael Bell 10/20/2022, 5:58 PM

## 2022-10-20 NOTE — Progress Notes (Addendum)
Subjective: Mr. Michael Bell is a 62 yo M with pertinent PMHx of NSTEMI, HFrEF (40-45) in 2023, HTN, and tobacco and alcohol use who presented with chest pain and SOB found to have new onset Afib with RVR and acute heart failure exacerbation  Feel anxious and nervious this morning. Stated that he has lost family members over the last year. He is longer requiring nasal cannula but continues to endorse some DOE. Tele continuing to show atrial fibrillation. Went for RHC/LHC later in the day     Objective: Vital signs in last 24 hours: Vitals:   10/20/22 0802 10/20/22 0834 10/20/22 0900 10/20/22 1344  BP:    110/66  Pulse:    80  Resp:    19  Temp:   97.8 F (36.6 C) 98.2 F (36.8 C)  TempSrc:   Oral Oral  SpO2: 95% 91%  95%  Weight:      Height:       Weight change: 1.361 kg  Intake/Output Summary (Last 24 hours) at 10/20/2022 1357 Last data filed at 10/20/2022 1343 Gross per 24 hour  Intake 1323.29 ml  Output 500 ml  Net 823.29 ml   Physical exam:   BP 110/66 (BP Location: Right Arm)   Pulse 80   Temp 98.2 F (36.8 C) (Oral)   Resp 19   Ht 6\' 3"  (1.905 m)   Wt 112.9 kg   SpO2 95%   BMI 31.12 kg/m  General appearance: anxious, in no acute distress  Head: Normocephalic, without obvious abnormality, atraumatic Eyes: conjunctivae/corneas clear. EOM's intact.  Lungs:  crackles present at bases bilaterally , no wheezing, normal WOB   Heart: irregularly irregular rhythm, tachycardia, no m/r/g Abdomen:  soft, distended abdomen. Non-tender on palpation Extremities: trace non-pitting edema bilaterally  Lab Results:    Latest Ref Rng & Units 10/20/2022   12:38 AM 10/19/2022   12:07 AM 10/18/2022    1:39 AM  CBC  WBC 4.0 - 10.5 K/uL 13.1  10.9    Hemoglobin 13.0 - 17.0 g/dL 01.0  27.2  53.6   Hematocrit 39.0 - 52.0 % 43.5  42.2  40.0   Platelets 150 - 400 K/uL 277  260        Latest Ref Rng & Units 10/20/2022   12:38 AM 10/19/2022   12:07 AM 10/18/2022    4:45 AM  CMP  Glucose  70 - 99 mg/dL 96  644    BUN 8 - 23 mg/dL 26  27    Creatinine 0.34 - 1.24 mg/dL 7.42  5.95    Sodium 638 - 145 mmol/L 137  139    Potassium 3.5 - 5.1 mmol/L 3.5  3.0    Chloride 98 - 111 mmol/L 105  104    CO2 22 - 32 mmol/L 22  23    Calcium 8.9 - 10.3 mg/dL 8.4  8.5    Total Protein 6.5 - 8.1 g/dL   7.7   Total Bilirubin 0.3 - 1.2 mg/dL   0.8   Alkaline Phos 38 - 126 U/L   83   AST 15 - 41 U/L   17   ALT 0 - 44 U/L   22    No results for input(s): "GLUCAP" in the last 72 hours. Micro Results: No results found for this or any previous visit (from the past 240 hour(s)). Studies/Results: CARDIAC CATHETERIZATION  Result Date: 10/20/2022 Images from the original result were not included. LM: Normal LAD: No significant disease Ramus: No significant  disease Lcx: Large, codominant. Very distal occlusion (unchanged since 07/2021) RCA: Small, codominant. 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 Exam Location:  Inpatient Procedure: Cardiac Doppler, Color Doppler and 2D Echo Indications:     A fib  History:         Patient has prior history of Echocardiogram examinations, most                  recent 08/05/2021. Risk Factors:Hypertension and Current Smoker.  Sonographer:     Delcie Roch RDCS Referring Phys:  [id number] GRACE LAU Diagnosing Phys: Thurmon Fair MD IMPRESSIONS  1. Left ventricular ejection fraction, by estimation, is 35 to 40%. The left ventricle has moderately decreased function. The left ventricle demonstrates global hypokinesis. The left ventricular internal cavity size was mildly dilated. There is mild eccentric left ventricular hypertrophy. Left ventricular diastolic function could not be evaluated.  2. Right ventricular systolic function is normal. The right ventricular size is mildly enlarged. Tricuspid regurgitation signal is inadequate for assessing PA pressure.  3. Left atrial size was severely dilated.  4. Right atrial size was severely dilated.  5. The mitral valve is normal in structure. No evidence of mitral valve regurgitation.  6. The aortic valve is tricuspid. There is mild calcification of the aortic valve. Aortic valve regurgitation is not visualized. Aortic valve sclerosis is present, with no evidence of aortic valve stenosis.  7. The inferior vena cava is dilated in size with >50% respiratory variability, suggesting right atrial pressure of 8 mmHg. Comparison(s): No significant change from prior study. FINDINGS  Left Ventricle: Left ventricular ejection fraction, by estimation, is 35 to 40%. The left ventricle has moderately decreased function. The left ventricle demonstrates global hypokinesis. The left ventricular internal cavity size was mildly dilated. There is mild eccentric left ventricular hypertrophy. Left ventricular diastolic function could not be evaluated due to nondiagnostic images. Left ventricular diastolic function could not be evaluated. Right Ventricle: The right ventricular size is mildly enlarged. No increase in right ventricular wall thickness. Right ventricular systolic function is normal. Tricuspid regurgitation signal is inadequate for assessing PA pressure. Left Atrium: Left atrial size was severely dilated. Right Atrium: Right atrial size was severely dilated. Pericardium: There is no evidence of  pericardial effusion. Mitral Valve: The mitral valve is normal in structure. No evidence of mitral valve regurgitation. Tricuspid Valve: The tricuspid valve is normal in structure. Tricuspid valve regurgitation is not demonstrated. Aortic Valve: The aortic valve is tricuspid.  There is mild calcification of the aortic valve. Aortic valve regurgitation is not visualized. Aortic valve sclerosis is present, with no evidence of aortic valve stenosis. Pulmonic Valve: The pulmonic valve was normal in structure. Pulmonic valve regurgitation is not visualized. No evidence of pulmonic stenosis. Aorta: The aortic root and ascending aorta are structurally normal, with no evidence of dilitation. Venous: The inferior vena cava is dilated in size with greater than 50% respiratory variability, suggesting right atrial pressure of 8 mmHg. IAS/Shunts: No atrial level shunt detected by color flow Doppler.  LEFT VENTRICLE PLAX 2D LVIDd:         6.25 cm LVIDs:         4.90 cm LV PW:         1.35 cm LV IVS:        1.25 cm LVOT diam:     2.40 cm LV SV:         80 LV SV Index:   32 LVOT Area:     4.52 cm  RIGHT VENTRICLE          IVC RV Basal diam:  4.10 cm  IVC diam: 2.70 cm TAPSE (M-mode): 1.7 cm LEFT ATRIUM              Index        RIGHT ATRIUM           Index LA diam:        5.80 cm  2.36 cm/m   RA Area:     31.50 cm LA Vol (A2C):   133.0 ml 54.10 ml/m  RA Volume:   114.00 ml 46.38 ml/m LA Vol (A4C):   118.0 ml 48.00 ml/m LA Biplane Vol: 126.0 ml 51.26 ml/m  AORTIC VALVE LVOT Vmax:   101.00 cm/s LVOT Vmean:  61.200 cm/s LVOT VTI:    0.176 m  AORTA Ao Root diam: 3.10 cm Ao Asc diam:  3.80 cm  SHUNTS Systemic VTI:  0.18 m Systemic Diam: 2.40 cm Rachelle Hora Croitoru MD Electronically signed by Thurmon Fair MD Signature Date/Time: 10/18/2022/3:42:24 PM    Final (Updated)    Medications: I have reviewed the patient's current medications. Scheduled Meds:  amiodarone  200 mg Oral BID   amLODipine  5 mg Oral Daily   [START ON 10/21/2022]  aspirin EC  81 mg Oral Daily   atorvastatin  80 mg Oral Daily   dapagliflozin propanediol  10 mg Oral Daily   furosemide  40 mg Intravenous Daily   isosorbide mononitrate  60 mg Oral Daily   metoprolol succinate  25 mg Oral Daily   nicotine  21 mg Transdermal Daily   sacubitril-valsartan  1 tablet Oral BID   sodium chloride flush  3 mL Intravenous Q12H   umeclidinium-vilanterol  1 puff Inhalation Daily   Continuous Infusions:  sodium chloride     amiodarone 30 mg/hr (10/20/22 0637)   heparin Stopped (10/20/22 1301)   PRN Meds:.sodium chloride, acetaminophen **OR** acetaminophen, acetaminophen, hydrALAZINE, hydrOXYzine, ipratropium-albuterol, labetalol, ondansetron (ZOFRAN) IV, mouth rinse, polyethylene glycol, sodium chloride flush  Assessment/Plan: Principal Problem:   Atrial fibrillation with RVR (HCC) Active Problems:   HTN, goal below 130/80   Tobacco user   Acute on chronic systolic heart failure (HCC)   Alcohol use   Coronary artery disease involving native coronary artery of native heart   Acute on chronic congestive heart failure (HCC)   HFrEF (heart failure with reduced ejection fraction) (HCC)   Hilar lymphadenopathy   Tobacco abuse  Mr. Overfelt is a 62 yo M with pertinent PMHx of NSTEMI, HFrEF (40-45) in 2023, HTN, and tobacco and alcohol use who presented with chest pain and SOB found to have new onset Afib with RVR and acute heart failure exacerbation  #Afib w/ RVR, improving  CHADSVAC score: 3 (HTN, NSTEMI, HFrEF). Possible etiologies include substance use history (ETOH, tobacco), uncontrolled hypertension, and concomitant uncontrolled HF. Given rates are controlled, will defer TEE cardioversion -Cardiology following, appreciate recs as discussed below:  -Wean off IV amiodarone infusion and start PO amiodarone 200mg  BID -Discontinue hep gtt  -Start Eliquis 5mg  BID  -Continue metoprolol succinate 25mg  daily   #Acute on Chronic Heart Failure Exacerbation (EF  35-40%) #CHF NYHA class III-IV #Pulmonary hypertension  Likely 2/2 to medication non-adherence over the last 6 months due to financial hardship, uncontrolled HTN, and new onset Atrial fib with RVR. Prior ECHO done 08/05/2021 showed EF 40-45% with mildly reduced LV wall function. Repeat ECHO showed reducing EF 35-40% with elevated RA pressures and biatrial dilatation (7/23). LHC/RHC on 7/25 showed distal occlusion at LCX c/w prior cath on 07/2021 and RCA with diffuse 80-90% mid-vessel disease. Pulmonary hypertension noted with CO 4.9 and CI 2.0, pressures: PA 37/41mmHg, mPAP , PCW    -See cardiology recs below: -Cont IV Lasix 40mg  daily -Continue GDMT as follows: -Hold Entresto 97-103mg  BID iso rising Cr. Consider restarting at lower dose 7/26 pending blood pressures  -Increase Farxiga to 10mg  daily  -Cont metoprolol succinate 25mg  daily  -Cont Imdur 60mg  daily -Strict I/Os, daily weights -Mg level -PT eval   #Elevated tropnonin  H/o CAD #C/f Angina LDL 103, has not taken home medications. Troponins: 42 --> 47 --> 40. Initial EKG showed Afib with some ST segment and T wave abnormalities c/f ischemia. Cath on 08/05/21 showed PDA occlusion and ulceration and up to 90% stenosis of the RCA which was medically managed. Suspect elevated troponins 2/2 non-adherence with medications and acute HF exacerbation. LHC/RHC performed on 7/25, see above for further details.   -Cont atorvastatin 80 mg daily -Cont bASA -Cont amlodipine 5mg  daily    #Enlarging hilar LAD  CT angio chest PE showed no evidence of an embolus but rather an enlarging mediastinal and bilateral hilar lymph nodes. Cannot exclude metastatic disease or lymphoma. Patient is lifelong tobacco user and not up to date on any of his cancer screenings with extensive family history of cancer. No B symptoms. Previously required 3L [place] but now on RA with normal sats. Consulted pulmonary for further management  -Pulmonary following,  appreciate recs, recommended PET scan outpatient; scheduled fu with Dr. Delton Coombes outpatient in 4-6 weeks, likely requires EBUS with biopsy  -Recommended to start Anoro Ellipita and PRN Duonebs  -Will need outpatient PFTs and sleep study   #Hypokalemia, resolved  Potassium WNL -Daily BMP    #Tobacco use disorder Not currently on outpatient therapy. Continues to smoke. Discuss therapies and started on nicotine patch   -Encourage smoking cessation -Continue nicotine patch 21mg     #Uncontrolled HTN  Has been unable to take home medications due to financial difficulties. -Continue antihypertensives as stated above    #H/o AAA Reported infrarenal abdominal aortic aneurysm 3.7 cm on U/S on 07/2021. Recommend follow-up ultrasound every 2 years -Outpatient follow-up   #Social determinants of health Patient is uninsured, working with case management/social work on this, as no insurance will be a barrier to outpatient tests/follow up/   Diet: Heart Healthy VTE: Heparin IVF: None, Code: Full   Prior  to Admission Living Arrangement: Home, with spouse.  Anticipated Discharge Location: Home Barriers to Discharge: medical workup   Dispo: Admit patient to Inpatient with expected length of stay greater than 2 midnights  This is a Psychologist, occupational Note.  The care of the patient was discussed with Dr. Sol Blazing and the assessment and plan formulated with their assistance.  Please see their attached note for official documentation of the daily encounter.   LOS: 2 days   Loyal Buba 10/20/2022, 1:57 PM  Attestation for Student Documentation:  I personally was present and performed or re-performed the history, physical exam and medical decision-making activities of this service and have verified that the service and findings are accurately documented in the student's note.  Chauncey Mann, DO 10/20/2022, 2:45 PM

## 2022-10-20 NOTE — Interval H&P Note (Signed)
History and Physical Interval Note:  10/20/2022 8:03 AM  Michael Bell  has presented today for surgery, with the diagnosis of nstemi.  The various methods of treatment have been discussed with the patient and family. After consideration of risks, benefits and other options for treatment, the patient has consented to  Procedure(s): RIGHT/LEFT HEART CATH AND CORONARY ANGIOGRAPHY (N/A) as a surgical intervention.  The patient's history has been reviewed, patient examined, no change in status, stable for surgery.  I have reviewed the patient's chart and labs.  Questions were answered to the patient's satisfaction.      Aquinnah Devin J Zenola Dezarn

## 2022-10-20 NOTE — Plan of Care (Signed)

## 2022-10-20 NOTE — Consult Note (Addendum)
NAME:  Michael Bell, MRN:  [id number], DOB:  09-16-1960, LOS: 2 ADMISSION DATE:  10/18/2022, CONSULTATION DATE:  7/25 REFERRING MD:  Dr. Sol Blazing, CHIEF COMPLAINT:  Enlarging Hilar LAD   History of Present Illness:  Patient is a 62 yo M w/ pertinent PMH NSTEMI, HFrEF, HTN, tobacco abuse (smokes 2 PPD), emphysema, alcohol use presents to [facility] ED on 7/23 w/ afib rvr and chf exacerbation.  On 7/23, patient having chest tightness and sob w/ exertion and laying flat. Came to [facility] ED for further workup. On arrival patient w/ afib rate rvr and signficant HTN. Started on diltiazem and heparin. Giving diuresis for wob. Cards consulted and started IV amio on 7/24 w/ metoprolol. CTA chest negative for PE; enlarging mediastinal and b/l hilar lymph nodes; small b/l pleural effusions. Patient denies any weight loss, fever, nigh sweats. PCCM consulted for hilar LAD.   Pertinent  Medical History   Past Medical History:  Diagnosis Date   Acute diastolic heart failure (HCC)    Arthritis    CHF (congestive heart failure) (HCC)    Headache    Hypertension    Hypertensive urgency      Significant Hospital Events: Including procedures, antibiotic start and stop dates in addition to other pertinent events   7/23 admitted afib rvr and chf exacerbation; CT chest showing enlarging hilar LAD 7/25 pccm consulted for hilar LAD  Interim History / Subjective:  See above  Objective   Blood pressure (!) 122/96, pulse 86, temperature 97.8 F (36.6 C), temperature source Oral, resp. rate 16, height 6\' 3"  (1.905 m), weight 112.9 kg, SpO2 91%.        Intake/Output Summary (Last 24 hours) at 10/20/2022 1112 Last data filed at 10/20/2022 0640 Gross per 24 hour  Intake 1347.29 ml  Output 800 ml  Net 547.29 ml   Filed Weights   10/19/22 0106 10/19/22 0328 10/20/22 0333  Weight: 111.9 kg 111.9 kg 112.9 kg    Examination: General: NAD HEENT: MM pink/moist Neuro: Aox3; MAE CV: s1s2, no m/r/g PULM:  dim BS  bilaterally GI: soft, bsx4 active  Extremities: warm/dry, trace edema  Skin: no rashes or lesions appreciated   Resolved Hospital Problem list     Assessment & Plan:  Enlarging hilar LAD -CT angio chest enlarging mediastinal and b/l hilar lymph nodes 3cm from 1.8 cm on previous CT chest 2022; small b/l pleural effusions. Cannot exclude metastatic disease or lymphoma, with recommendation for further work up with PET scan. Patient is lifelong tobacco user and not up to date on any of his cancer screenings with extensive family history of cancer. Denies B symptoms. Plan: -recommend PET scan outpt -will have patient f/u w/ Dr. Delton Coombes outpt pulmonary in 4-6 weeks -will likely require EBUS w/ biopsy  Emphysema: home med anoro ellipta not taking Possible OSA Tobacco use disorder Plan: -start on anoro ellipta -prn duoneb -smoking cessation counseling; nicotine patch -will need outpt PFTs and sleep study  Best Practice (right click and "Reselect all SmartList Selections" daily)   Per primary  Labs   CBC: Recent Labs  Lab 10/18/22 0116 10/18/22 0139 10/19/22 0007 10/20/22 0038  WBC 11.3*  --  10.9* 13.1*  NEUTROABS  --   --  5.6  --   HGB 13.3 13.6 13.7 14.1  HCT 41.0 40.0 42.2 43.5  MCV 83.2  --  84.1 82.4  PLT 237  --  260 277    Basic Metabolic Panel: Recent Labs  Lab 10/18/22 0116  10/18/22 0139 10/19/22 0007 10/20/22 0038  NA 138 143 139 137  K 3.9 4.0 3.0* 3.5  CL 111 110 104 105  CO2 18*  --  23 22  GLUCOSE 112* 107* 115* 96  BUN 25* 26* 27* 26*  CREATININE 1.30* 1.40* 1.47* 1.37*  CALCIUM 8.7*  --  8.5* 8.4*  MG  --   --  1.8  --    GFR: Estimated Creatinine Clearance: 75.8 mL/min (A) (by C-G formula based on SCr of 1.37 mg/dL (H)). Recent Labs  Lab 10/18/22 0116 10/19/22 0007 10/20/22 0038  WBC 11.3* 10.9* 13.1*    Liver Function Tests: Recent Labs  Lab 10/18/22 0445  AST 17  ALT 22  ALKPHOS 83  BILITOT 0.8  PROT 7.7  ALBUMIN 3.7   No  results for input(s): "LIPASE", "AMYLASE" in the last 168 hours. No results for input(s): "AMMONIA" in the last 168 hours.  ABG    Component Value Date/Time   PHART 7.466 (H) 08/05/2021 0814   PCO2ART 35.8 08/05/2021 0814   PO2ART 80 (L) 08/05/2021 0814   HCO3 25.8 08/05/2021 0814   TCO2 20 (L) 10/18/2022 0139   O2SAT 96 08/05/2021 0814     Coagulation Profile: No results for input(s): "INR", "PROTIME" in the last 168 hours.  Cardiac Enzymes: No results for input(s): "CKTOTAL", "CKMB", "CKMBINDEX", "TROPONINI" in the last 168 hours.  HbA1C: Hgb A1c MFr Bld  Date/Time Value Ref Range Status  08/04/2021 06:56 PM 5.6 4.8 - 5.6 % Final    Comment:    (NOTE) Pre diabetes:          5.7%-6.4%  Diabetes:              >6.4%  Glycemic control for   <7.0% adults with diabetes     CBG: No results for input(s): "GLUCAP" in the last 168 hours.  Review of Systems:   Review of Systems  Constitutional:  Negative for fever.  Respiratory:  Positive for shortness of breath. Negative for cough.   Cardiovascular:  Positive for orthopnea and leg swelling. Negative for chest pain.  Gastrointestinal:  Negative for abdominal pain, nausea and vomiting.     Past Medical History:  He,  has a past medical history of Acute diastolic heart failure (HCC), Arthritis, CHF (congestive heart failure) (HCC), Headache, Hypertension, and Hypertensive urgency.   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 He started smoking about 39 years ago. He has a 0.8 pack-year smoking history. He has never used smokeless tobacco. He reports that he does not currently use alcohol. He reports that he does not currently use drugs after having used the following drugs: Marijuana.   Family History:  His family history includes COPD in his maternal grandmother; Cancer in his mother, paternal uncle, and sister; Colon cancer (age of onset: 45) in his sister; Colon polyps in  his father; Diabetes in his mother; Down syndrome in his paternal aunt; Heart disease in his brother, brother, father, and mother; Hyperlipidemia in his brother, father, and mother; Hypertension in his brother, brother, father, and mother; Mental illness in his mother; Ovarian cancer (age of onset: 72) in his mother; Stomach cancer in his paternal grandmother; Stroke in his father and mother.   Allergies No Known Allergies   Home Medications  Prior to Admission medications   Medication Sig Start Date End Date Taking? Authorizing Provider  albuterol (VENTOLIN HFA) 108 (90 Base) MCG/ACT inhaler Inhale 1-2 puffs into the lungs every 4 (four) hours as needed for wheezing or shortness of breath. Patient not taking: Reported on 10/18/2022 08/06/21   Steffanie Rainwater, MD  aspirin EC 81 MG tablet Take 1 tablet (81 mg total) by mouth daily. Patient not taking: Reported on 10/18/2022 02/08/22   Marrianne Mood, MD  atorvastatin (LIPITOR) 80 MG tablet Take 1 tablet (80 mg total) by mouth at bedtime. Patient not taking: Reported on 10/18/2022 04/26/22   Marrianne Mood, MD  carvedilol (COREG) 25 MG tablet Take 1 tablet (25 mg total) by mouth 2 (two) times daily with a meal. Patient not taking: Reported on 10/18/2022 12/20/21   Adron Bene, MD  clonazePAM (KLONOPIN) 0.5 MG tablet Take 1 tablet (0.5 mg total) by mouth 2 (two) times daily for 14 days. Patient not  taking: Reported on 10/18/2022 10/05/21 10/19/21  Willette Cluster, MD  clopidogrel (PLAVIX) 75 MG tablet Take 1 tablet (75 mg total) by mouth daily. Patient not taking: Reported on 10/18/2022 04/26/22   Marrianne Mood, MD  dapagliflozin propanediol (FARXIGA) 10 MG TABS tablet Take 1 tablet (10 mg total) by mouth daily before breakfast. Patient not taking: Reported on 10/18/2022 04/26/22   Tolia, Sunit, DO  escitalopram (LEXAPRO) 10 MG tablet Take 1 tablet (10 mg total) by mouth daily. Patient not taking: Reported on 10/18/2022 11/16/21 03/03/22  Steffanie Rainwater, MD  fluticasone [facility]) 50 MCG/ACT nasal spray Place 2 sprays into both nostrils daily. Patient not taking: Reported on 10/18/2022 02/15/22   Marrianne Mood, MD  furosemide (LASIX) 40 MG tablet Take 1 tablet (40 mg total) by mouth daily. Patient not taking: Reported on 10/18/2022 11/05/21   Steffanie Rainwater, MD  ranolazine (RANEXA) 500 MG 12 hr tablet Take 1 tablet (500 mg total) by mouth 2 (two) times daily. Patient not taking: Reported on 10/18/2022 04/26/22   Tolia, Sunit, DO  sacubitril-valsartan (ENTRESTO) 97-103 MG Take 1 tablet by mouth 2 (two) times daily. Patient not taking: Reported on 10/18/2022 03/10/22   Marrianne Mood, MD  umeclidinium-vilanterol [facility] ELLIPTA) 62.5-25 MCG/ACT AEPB Inhale 1 puff into the lungs daily. Patient not taking: Reported on 10/18/2022 04/26/22   Marrianne Mood, MD         JD Anselm Lis [facility] 10/20/2022, 11:12 AM  Please see [email or web address] for pager details.  From 7A-7P if no response, please call [phone]. After hours, please call ELink [phone].

## 2022-10-20 NOTE — Progress Notes (Signed)
ANTICOAGULATION CONSULT NOTE - Initial Consult  Pharmacy Consult for apixaban. Indication: atrial fibrillationa  No Known Allergies  Patient Measurements: Height: 6\' 3"  (190.5 cm) Weight: 112.9 kg (249 lb) IBW/kg (Calculated) : 84.5  Vital Signs: Temp: 98.2 F (36.8 C) (07/25 1344) Temp Source: Oral (07/25 1344) BP: 110/66 (07/25 1344) Pulse Rate: 80 (07/25 1344)  Labs: Recent Labs    10/18/22 0116 10/18/22 0139 10/18/22 0320 10/18/22 0948 10/18/22 0948 10/18/22 1750 10/19/22 0007 10/20/22 0038  HGB 13.3 13.6  --   --   --   --  13.7 14.1  HCT 41.0 40.0  --   --   --   --  42.2 43.5  PLT 237  --   --   --   --   --  260 277  HEPARINUNFRC  --   --   --  0.13*   < > 0.38 0.40 0.40  CREATININE 1.30* 1.40*  --   --   --   --  1.47* 1.37*  TROPONINIHS 42*  --  47* 40*  --   --  40*  --    < > = values in this interval not displayed.    Estimated Creatinine Clearance: 75.8 mL/min (A) (by C-G formula based on SCr of 1.37 mg/dL (H)).   Medical History: Past Medical History:  Diagnosis Date   Acute diastolic heart failure (HCC)    Arthritis    CHF (congestive heart failure) (HCC)    Headache    Hypertension    Hypertensive urgency      Plan:  Per Cardiology request, commence apixaban 5mg  PO BID for atrial fibrillation, 2 hours after TR band has been removed. RN confirms removal at 1300 hours with no bleeding per her assessment. Will start initial dose at 1500h and then 2200 and 1000 every day.  Elicia Lamp 10/20/2022,2:25 PM

## 2022-10-20 NOTE — TOC Initial Note (Addendum)
Transition of Care [facility]) - Initial/Assessment Note    Patient Details  Name: Michael Bell MRN: [id number] Date of Birth: 10-Aug-1960  Transition of Care [facility]) CM/SW Contact:    Michael Pares, RN Phone Number: 10/20/2022, 2:26 PM  Clinical Narrative:                 Consult for Medication assistance. Patient presented with Atrial Fibrillation, Has hilar nodules. Has follow up with pulmonology, in patient instructions.  Attempted to call Michael Bell to discuss his follow up and how he pays for medications. He does have a PCP. SDOH no interventions.  Will try again this afternoon as time permits to contact him.  TOC will follow for needs, recommendations, and transitions of care  [facility] Medication Assistance Card Name: Michael Bell ID:[id number]   [facility]: [id number] RX Group: [id number] Discharge Date:  10/20/2022 Expiration Date:  10/27/2022 (must be filled within 7 days of discharge) Dear ______[name]  Bonita Quin have been approved to have the prescriptions written by your discharging physician filled through our [facility] (Medication Assistance Through [facility]) program. This program allows for a one-time (no refills) 34-day supply of selected medications for a low copay amount.  The copay is $3.00 per prescription. For instance, if you have one prescription, you will pay $3.00; for two prescriptions, you pay $6.00; for three prescriptions, you pay $9.00; and so on.  Only certain pharmacies are participating in this program with [facility]. You will need to select one of the pharmacies from the attached list and take your prescriptions, this letter, and your photo ID to one of the participating pharmacies.   We are excited that you are able to use the [facility] program to get your medications. These prescriptions must be filled within 7 days of hospital discharge or they will no longer be valid for the [facility] program. Should you have any problems with your prescriptions please contact your case management  team member at [phone] for [place] [facility].  Thank you, Michael Crease DNP, MSN, RN [phone]   [facility]    [place] - Locations  [facility]        [address] [place], [place]  [facility]    [address] [place], [place], [place]   [facility]        [address], [place], [place]  [facility]       [address] [place], [place], [place]  Other [facility] [address] [place], [place]  [facility]                                                                  [address], [place], [place]  [facility]          [address], [place], [place]  [facility] [address], [place], [place]   [address] [place], [place], [place]  [address], [place], [place]  [address] [place], [place]   [address] [place], [place]   [address], [place], [place]   [address], [place], [place] [address] [place], [place] [address], [place], [place]    [facility] [address], [place], [place]  [address] [place], [place] [address], [place], [place]  [address], [place], [place] [address], [place], [place] [address], [place], [place] [address], [place], [place]  [address] [place] 1478 [place], [place], [place] [address], [place], [place] [address]6 [address], [place], [place]   [address], [place], [place] [address], [place], [place] [address], [place], [place] [address], [place], [place]   [address], [place], [place]   [address] [place], [place] [address] [place], [place], [place]  [address], [place], [place]  [facility] [address] [place], [place], [place] [address] [place], [place], [place] [address] [place], [place], [place] [address] [place], [place] [address], [place], [place]  [address] [place], [place] [address], [place], [place] [address] [place] [address] [place], [place], [place] [address], [place], [place] [address], [place], [place] [address] [place], [place], [place] [address], [place], [place] (effective 09/25/16) [address], [place], [place] (effective 09/25/16)     Expected Discharge Plan: Home/Self Care Barriers to Discharge: Inadequate or no insurance   Patient Goals and CMS Choice            Expected Discharge Plan and Services       Living arrangements for the past 2 months: Apartment                                      Prior Living Arrangements/Services Living arrangements for the past 2 months: Apartment Lives with:: Spouse Patient language and need for interpreter reviewed:: Yes        Need for Family Participation in Patient Care: Yes (Comment) Care giver support system in place?: Yes (comment)      Activities of Daily Living Home Assistive Devices/Equipment: None ADL Screening (condition at time of admission) Patient's cognitive ability adequate to safely complete daily activities?: No Is the patient deaf or have difficulty hearing?: No Does the patient have difficulty seeing, even when wearing glasses/contacts?: No Does the patient have difficulty concentrating, remembering, or making decisions?: No Patient able to express need for assistance with ADLs?: Yes Does the patient have difficulty dressing or bathing?: No Independently performs ADLs?: Yes (appropriate for developmental age) Does the patient have difficulty walking or climbing stairs?: No Weakness of Legs: None Weakness of Arms/Hands: None  Permission  Sought/Granted                  Emotional Assessment       Orientation: : Oriented to Self, Oriented to Place, Oriented to Situation, Oriented to  Time Alcohol / Substance Use: Tobacco Use, Alcohol Use Psych Involvement: No (comment)  Admission diagnosis:  Atrial fibrillation with rapid ventricular response (HCC) [I48.91] Atrial fibrillation with RVR (HCC) [I48.91] Acute on chronic congestive heart failure, unspecified heart failure type [facility]) [I50.9] Patient Active Problem List   Diagnosis Date Noted   HFrEF (heart failure with reduced ejection fraction) (HCC) 10/20/2022   Hilar lymphadenopathy 10/20/2022   Tobacco abuse 10/20/2022   Atrial fibrillation with RVR (HCC) 10/18/2022   Coronary artery disease involving native coronary artery  of native heart 10/18/2022   Acute on chronic congestive heart failure (HCC) 10/18/2022   Emphysema of lung (HCC) 10/05/2021   Anxiety 10/05/2021   Acute on chronic systolic heart failure (HCC) 08/13/2021   Alcohol use 08/13/2021   HNP (herniated nucleus pulposus), lumbar 01/03/2014    Class: Acute   HTN, goal below 130/80 07/22/2013   Chronic right hip pain 07/22/2013   Tobacco user 07/22/2013   PCP:  Marrianne Mood, MD Pharmacy:   [facility] [address] [place] [address] Phone: [phone] Fax: [phone]  [facility] [address] [place] [address] Phone: [phone] Fax: [phone]  [facility] [address] [place] [address] Phone: [phone] Fax: [phone]     Social Determinants of Health (SDOH) Social History: SDOH Screenings   Food Insecurity: No Food Insecurity (10/18/2022)  Housing: Low Risk  (10/18/2022)  Transportation Needs: No Transportation Needs (10/18/2022)  Utilities: Not At Risk (10/18/2022)  Depression (PHQ2-9): High Risk (10/05/2021)  Tobacco Use: High Risk  (10/18/2022)   SDOH Interventions:     Readmission Risk Interventions     No data to display

## 2022-10-21 ENCOUNTER — Encounter (HOSPITAL_COMMUNITY): Payer: Self-pay | Admitting: Cardiology

## 2022-10-21 LAB — CBC: WBC: 11.2 10*3/uL — ABNORMAL HIGH (ref 4.0–10.5)

## 2022-10-21 MED ORDER — SACUBITRIL-VALSARTAN 49-51 MG PO TABS
1.0000 | ORAL_TABLET | Freq: Two times a day (BID) | ORAL | Status: DC
  Administered 2022-10-21 – 2022-10-23 (×4): 1 via ORAL
  Filled 2022-10-21 (×5): qty 1

## 2022-10-21 MED ORDER — FUROSEMIDE 10 MG/ML IJ SOLN
40.0000 mg | Freq: Once | INTRAMUSCULAR | Status: AC
  Administered 2022-10-21: 40 mg via INTRAVENOUS
  Filled 2022-10-21: qty 4

## 2022-10-21 MED ORDER — SACUBITRIL-VALSARTAN 24-26 MG PO TABS
1.0000 | ORAL_TABLET | Freq: Two times a day (BID) | ORAL | Status: DC
  Administered 2022-10-21: 1 via ORAL
  Filled 2022-10-21: qty 1

## 2022-10-21 MED ORDER — METOPROLOL SUCCINATE ER 50 MG PO TB24
50.0000 mg | ORAL_TABLET | Freq: Every day | ORAL | Status: DC
  Administered 2022-10-22 – 2022-10-23 (×2): 50 mg via ORAL
  Filled 2022-10-21 (×2): qty 1

## 2022-10-21 MED ORDER — LIDOCAINE 5 % EX PTCH
1.0000 | MEDICATED_PATCH | CUTANEOUS | Status: DC
  Administered 2022-10-21: 1 via TRANSDERMAL
  Filled 2022-10-21 (×2): qty 1

## 2022-10-21 MED ORDER — FUROSEMIDE 40 MG PO TABS: 40.0000 mg | ORAL_TABLET | Freq: Every day | ORAL | Status: DC

## 2022-10-21 NOTE — Discharge Summary (Signed)
*** Name: Michael Bell MRN: [id number] DOB: 09/05/1960 62 y.o. PCP: Michael Mood, MD  Date of Admission: 10/18/2022  1:08 AM Date of Discharge: No discharge date for patient encounter. Attending Physician: Michael La, MD  Discharge Diagnosis: 1. Principal Problem:   Atrial fibrillation with RVR (HCC) Active Problems:   HTN, goal below 130/80   Tobacco user   Acute on chronic systolic heart failure (HCC)   Alcohol use   Coronary artery disease involving native coronary artery of native heart   Acute on chronic congestive heart failure (HCC)   HFrEF (heart failure with reduced ejection fraction) (HCC)   Hilar lymphadenopathy   Tobacco abuse  ***  Discharge Medications: Allergies as of 10/21/2022   No Known Allergies   Med Rec must be completed prior to using this [facility]***       Disposition and follow-up:   Michael Bell was discharged from [facility] in {DISCHARGE CONDITION:19696} condition.  At the hospital follow up visit please address:  1.  ***  2.  Labs / imaging needed at time of follow-up: ***  3.  Pending labs/ test needing follow-up: ***  Follow-up Appointments:  Follow-up Information     Michael Peer, MD Follow up on 11/17/2022.   Specialty: Pulmonary Disease Why: at 1030 please arrive 15 minutes before . Follow up with pulmonary Contact information: [address] [place] [address] [phone]         Michael Miller, DO .   Specialty: Internal Medicine Contact information: [address] [place] [address] [phone]         Michael Lerner, DO Follow up on 11/01/2022.   Specialties: Cardiology, Vascular Surgery Why: 8/6 3:15 PM. Please bring all medications to the appointment Contact information: [address] [place]vin Knack [place] [address] [phone]                 Hospital Course by problem list: 1. ***  [ ]  PCP: Smoking cessation couseling  [ ]  Cardiology:  Follow-up ultrasound for reported infrarenal abdominal aortic aneurysm 3.7 cm on U/S on 07/2021. Cardioversion for persistent Afib. Office < 1 week to 10 days after discharge    [ ]  Pulmonology: Had bilateral hilar LAD on initial CT chest. Recommended PET scan  Scheduled fu with Dr. Delton Coombes outpatient in 4-6 weeks, likely requires EBUS with biopsy. Outpatient PFTs and sleep study    Mr. Michael Bell is a 62 yo M with pertinent PMHx of NSTEMI, HFrEF (EF 40-45%) in 2023, HTN, and tobacco and alcohol use who presented with chest pain and SOB found to have new onset Afib with RVR and acute on chronic heart failure exacerbation. He was previously taking his home medications but since January 2024 has not been able to due to financial hardships. He endorsed smoking 2 ppd and drinking up to 6 beers per day but denied any alcohol use in the last week and withdrawal symptoms. Initial heart rates were elevated to the 140s and he initially started on dilitazem drip. Serial EKGs showed conversion to NSR but he later remitted into atrial fibrillation. He was then started on an amiodarone infusion ***. A heart catheterization was performed on 7/25 that showed distal occlusion at LCX consistent with prior cath on 07/2021 and RCA with diffuse 80-90% mid-vessel disease. He was found to elevated pulmonary pressures consistent with pulmonary hypertension along with a low cardiac index***.     In regards to his acute on  chronic HF, his home GDMT was gradually resumed with IV lasix 40mg , Entresto 97-103mg , and Metoprolol succinate 25mg  ***. Sherryll Burger was temporarily held during the course of admission after he had an elevated creatinine then restarted at a lower dose ***    His home medications of atorvastatin 80mg  daily and aspirin 81mg  daily were resumed. Plavix was held during admission due to high risk of bleeding after being started on hep gtt. ***    #Afib w/ RVR, improving  CHADSVAC score: 3 (HTN, NSTEMI, HFrEF). Possible  etiologies include substance use history (ETOH, tobacco), uncontrolled hypertension, and concomitant uncontrolled HF. Given rates are controlled, will defer TEE cardioversion -Cardiology following, appreciate recs as discussed below:  -Wean off IV amiodarone infusion and start PO amiodarone 200mg  BID -Discontinue hep gtt  -Start Eliquis 5mg  BID  -Continue metoprolol succinate 25mg  daily    #Acute on Chronic Heart Failure Exacerbation (EF 35-40%) #CHF NYHA class III-IV #Pulmonary hypertension  Likely 2/2 to medication non-adherence over the last 6 months due to financial hardship, uncontrolled HTN, and new onset Atrial fib with RVR. Prior ECHO done 08/05/2021 showed EF 40-45% with mildly reduced LV wall function. Repeat ECHO showed reducing EF 35-40% with elevated RA pressures and biatrial dilatation (7/23). LHC/RHC on 7/25 showed distal occlusion at LCX c/w prior cath on 07/2021 and RCA with diffuse 80-90% mid-vessel disease. Pulmonary hypertension noted with CO 4.9 and CI 2.0, pressures: PA 37/35mmHg, mPAP , PCWP    -See cardiology recs below: -Cont IV Lasix 40mg  daily -Continue GDMT as follows: -Hold Entresto 97-103mg  BID iso rising Cr. Consider restarting at lower dose 7/26 pending blood pressures  -Increase Farxiga to 10mg  daily  -Cont metoprolol succinate 25mg  daily  -Cont Imdur 60mg  daily -Strict I/Os, daily weights -Mg level -PT eval    #Elevated tropnonin  H/o CAD #C/f Angina LDL 103, has not taken home medications. Troponins: 42 --> 47 --> 40. Initial EKG showed Afib with some ST segment and T wave abnormalities c/f ischemia. Cath on 08/05/21 showed PDA occlusion and ulceration and up to 90% stenosis of the RCA which was medically managed. Suspect elevated troponins 2/2 non-adherence with medications and acute HF exacerbation. LHC/RHC performed on 7/25, see above for further details.   -Cont atorvastatin 80 mg daily -Cont bASA -Cont amlodipine 5mg  daily    #Enlarging  hilar LAD  CT angio chest PE showed no evidence of an embolus but rather an enlarging mediastinal and bilateral hilar lymph nodes. Cannot exclude metastatic disease or lymphoma. Patient is lifelong tobacco user and not up to date on any of his cancer screenings with extensive family history of cancer. No B symptoms. Previously required 3L [place] but now on RA with normal sats. Consulted pulmonary for further management  -Pulmonary following, appreciate recs, recommended PET scan outpatient; scheduled fu with Dr. Delton Coombes outpatient in 4-6 weeks, likely requires EBUS with biopsy  -Recommended to start Anoro Ellipita and PRN Duonebs  -Will need outpatient PFTs and sleep study    #Hypokalemia, resolved  Potassium WNL -Daily BMP    #Tobacco use disorder Not currently on outpatient therapy. Continues to smoke. Discuss therapies and started on nicotine patch    -Encourage smoking cessation -Continue nicotine patch 21mg     #Uncontrolled HTN  Has been unable to take home medications due to financial difficulties. -Continue antihypertensives as stated above     #H/o AAA Reported infrarenal abdominal aortic aneurysm 3.7 cm on U/S on 07/2021. Recommend follow-up ultrasound every 2 years -Outpatient follow-up    #  Social determinants of health Patient is uninsured, working with case management/social work on this, as no insurance will be a barrier to outpatient tests/follow up/   Discharge Exam:   BP 122/73 (BP Location: Right Arm)   Pulse 76   Temp 98.5 F (36.9 C) (Oral)   Resp 20   Ht 6\' 3"  (1.905 m)   Wt 112.9 kg   SpO2 93%   BMI 31.12 kg/m  Discharge exam: ***  Pertinent Labs, Studies, and Procedures:  ***  @RSLTMICRO @ Lab Results  Component Value Date   WBC 11.2 (H) 10/21/2022   HGB 14.3 10/21/2022   HCT 44.0 10/21/2022   PLT 253 10/21/2022   Lab Results  Component Value Date   NA 138 10/21/2022   K 3.6 10/21/2022   CL 105 10/21/2022   CO2 21 (L) 10/21/2022   BUN 25 (H)  10/21/2022   CREATININE 1.30 (H) 10/21/2022   CALCIUM 8.5 (L) 10/21/2022   MG 2.1 10/21/2022   Lab Results  Component Value Date   ALKPHOS 83 10/18/2022   BILITOT 0.8 10/18/2022   BILIDIR 0.2 10/18/2022   PROT 7.7 10/18/2022   ALBUMIN 3.7 10/18/2022   ALT 22 10/18/2022   AST 17 10/18/2022   Lab Results  Component Value Date   INR 1.09 08/12/2016    ECHOCARDIOGRAM COMPLETE  Result Date: 10/18/2022    ECHOCARDIOGRAM REPORT   Patient Name:   LAVORIS RICKETSON Date of Exam: 10/18/2022 Medical Rec #:  [id number]    Height:       75.0 in Accession #:    [id number]   Weight:       261.2 lb Date of Birth:  1961-03-13    BSA:          2.458 m Patient Age:    62 years     BP:           161/82 mmHg Patient Gender: M            HR:           92 bpm. Exam Location:  Inpatient Procedure: Cardiac Doppler, Color Doppler and 2D Echo Indications:     A fib  History:         Patient has prior history of Echocardiogram examinations, most                  recent 08/05/2021. Risk Factors:Hypertension and Current Smoker.  Sonographer:     Delcie Roch RDCS Referring Phys:  [id number] GRACE LAU Diagnosing Phys: Thurmon Fair MD IMPRESSIONS  1. Left ventricular ejection fraction, by estimation, is 35 to 40%. The left ventricle has moderately decreased function. The left ventricle demonstrates global hypokinesis. The left ventricular internal cavity size was mildly dilated. There is mild eccentric left ventricular hypertrophy. Left ventricular diastolic function could not be evaluated.  2. Right ventricular systolic function is normal. The right ventricular size is mildly enlarged. Tricuspid regurgitation signal is inadequate for assessing PA pressure.  3. Left atrial size was severely dilated.  4. Right atrial size was severely dilated.  5. The mitral valve is normal in structure. No evidence of mitral valve regurgitation.  6. The aortic valve is tricuspid. There is mild calcification of the aortic valve. Aortic valve  regurgitation is not visualized. Aortic valve sclerosis is present, with no evidence of aortic valve stenosis.  7. The inferior vena cava is dilated in size with >50% respiratory variability, suggesting right atrial pressure of 8 mmHg. Comparison(s): No significant  change from prior study. FINDINGS  Left Ventricle: Left ventricular ejection fraction, by estimation, is 35 to 40%. The left ventricle has moderately decreased function. The left ventricle demonstrates global hypokinesis. The left ventricular internal cavity size was mildly dilated. There is mild eccentric left ventricular hypertrophy. Left ventricular diastolic function could not be evaluated due to nondiagnostic images. Left ventricular diastolic function could not be evaluated. Right Ventricle: The right ventricular size is mildly enlarged. No increase in right ventricular wall thickness. Right ventricular systolic function is normal. Tricuspid regurgitation signal is inadequate for assessing PA pressure. Left Atrium: Left atrial size was severely dilated. Right Atrium: Right atrial size was severely dilated. Pericardium: There is no evidence of pericardial effusion. Mitral Valve: The mitral valve is normal in structure. No evidence of mitral valve regurgitation. Tricuspid Valve: The tricuspid valve is normal in structure. Tricuspid valve regurgitation is not demonstrated. Aortic Valve: The aortic valve is tricuspid. There is mild calcification of the aortic valve. Aortic valve regurgitation is not visualized. Aortic valve sclerosis is present, with no evidence of aortic valve stenosis. Pulmonic Valve: The pulmonic valve was normal in structure. Pulmonic valve regurgitation is not visualized. No evidence of pulmonic stenosis. Aorta: The aortic root and ascending aorta are structurally normal, with no evidence of dilitation. Venous: The inferior vena cava is dilated in size with greater than 50% respiratory variability, suggesting right atrial pressure  of 8 mmHg. IAS/Shunts: No atrial level shunt detected by color flow Doppler.  LEFT VENTRICLE PLAX 2D LVIDd:         6.25 cm LVIDs:         4.90 cm LV PW:         1.35 cm LV IVS:        1.25 cm LVOT diam:     2.40 cm LV SV:         80 LV SV Index:   32 LVOT Area:     4.52 cm  RIGHT VENTRICLE          IVC RV Basal diam:  4.10 cm  IVC diam: 2.70 cm TAPSE (M-mode): 1.7 cm LEFT ATRIUM              Index        RIGHT ATRIUM           Index Bell diam:        5.80 cm  2.36 cm/m   RA Area:     31.50 cm Bell Vol (A2C):   133.0 ml 54.10 ml/m  RA Volume:   114.00 ml 46.38 ml/m Bell Vol (A4C):   118.0 ml 48.00 ml/m Bell Biplane Vol: 126.0 ml 51.26 ml/m  AORTIC VALVE LVOT Vmax:   101.00 cm/s LVOT Vmean:  61.200 cm/s LVOT VTI:    0.176 m  AORTA Ao Root diam: 3.10 cm Ao Asc diam:  3.80 cm  SHUNTS Systemic VTI:  0.18 m Systemic Diam: 2.40 cm Rachelle Hora Croitoru MD Electronically signed by Thurmon Fair MD Signature Date/Time: 10/18/2022/3:42:24 PM    Final (Updated)    CT Angio Chest PE W and/or Wo Contrast  Result Date: 10/18/2022 CLINICAL DATA:  Pulmonary embolism (PE) suspected, high prob. Chest pain EXAM: CT ANGIOGRAPHY CHEST WITH CONTRAST TECHNIQUE: Multidetector CT imaging of the chest was performed using the standard protocol during bolus administration of intravenous contrast. Multiplanar CT image reconstructions and MIPs were obtained to evaluate the vascular anatomy. RADIATION DOSE REDUCTION: This exam was performed according to the departmental dose-optimization program which includes  automated exposure control, adjustment of the mA and/or kV according to patient size and/or use of iterative reconstruction technique. CONTRAST:  75mL OMNIPAQUE IOHEXOL 350 MG/ML SOLN COMPARISON:  01/11/2021 FINDINGS: Cardiovascular: No filling defects in the pulmonary arteries to suggest pulmonary emboli. Cardiomegaly. Scattered coronary artery and aortic calcifications. No evidence of aortic aneurysm. Mediastinum/Nodes: Enlarging right  paratracheal lymph node with a short axis diameter of 3 cm compared to 1.8 cm previously. New subcarinal adenopathy with a short axis diameter of 1.7 cm. AP window and prevascular lymph nodes borderline or mildly enlarged. Mildly enlarged right hilar lymph node with a short axis diameter of 1.6 cm, new since prior study. Borderline left hilar lymph node with a short axis diameter of 1.2 cm. Trachea and esophagus are unremarkable. Thyroid unremarkable. Lungs/Pleura: Centrilobular and paraseptal emphysema. Small bilateral pleural effusions. Compressive atelectasis in the lower lobes. Mild vascular congestion. Upper Abdomen: No acute findings in the upper abdomen. Musculoskeletal: Chest wall soft tissues are unremarkable. No acute bony abnormality. Review of the MIP images confirms the above findings. IMPRESSION: No evidence of pulmonary embolus. Enlarging mediastinal and bilateral hilar lymph nodes. Cannot exclude metastasis or lymphoma. Consider further evaluation with PET CT. Small bilateral pleural effusions.  Bibasilar atelectasis. Cardiomegaly, vascular congestion. Coronary artery disease. Aortic Atherosclerosis (ICD10-I70.0). Electronically Signed   By: Charlett Nose M.D.   On: 10/18/2022 02:23   No admission procedures for hospital encounter.  Discharge Instructions:   If you have any questions or concerns, call our clinic at [phone] or after hours call [phone] and ask for the internal medicine resident on call.  Signed: Lona Millard, Medical Student 10/21/2022, 2:26 PM   Pager: @[phone] @

## 2022-10-21 NOTE — Progress Notes (Addendum)
Subjective: Mr. Torchio is a 62 yo M with pertinent PMHx of NSTEMI, HFrEF (40-45) in 2023, HTN, and tobacco and alcohol use who presented with chest pain and SOB found to have new onset Afib with RVR and acute heart failure exacerbation  Continues to endorse SOB when laying flat. Was able to walk around the halls but became slightly dyspneic. He no longer reports a "heaviness" pressure at his chest but rather a sharp pain localized near his sternum. He has occasionally felt a centrally located abdominal pain. Continues to have bowel movements.   Objective: Vital signs in last 24 hours: Vitals:   10/20/22 2341 10/21/22 0346 10/21/22 0800 10/21/22 0810  BP: 124/81 (!) 125/93  126/88  Pulse: 92 81  62  Resp: 18 20  18   Temp: 98.3 F (36.8 C) 99 F (37.2 C)  98.5 F (36.9 C)  TempSrc: Oral Oral  Oral  SpO2: 97% 92% 92% 100%  Weight:      Height:       Weight change:   Intake/Output Summary (Last 24 hours) at 10/21/2022 1222 Last data filed at 10/21/2022 0800 Gross per 24 hour  Intake 240 ml  Output 1700 ml  Net -1460 ml   Physical exam:   BP 126/88 (BP Location: Right Arm)   Pulse 62   Temp 98.5 F (36.9 C) (Oral)   Resp 18   Ht 6\' 3"  (1.905 m)   Wt 112.9 kg   SpO2 100%   BMI 31.12 kg/m   General appearance: NAD, awake, cooperative   Head: Normocephalic, without obvious abnormality, atraumatic Eyes: conjunctivae/corneas clear. EOM's intact.  Lungs:  lungs clear , no wheezing, normal WOB   Heart: irregularly irregular rhythm, regular rate, no m/r/g Abdomen:  soft, mildly distended abdomen. Non-tender on palpation Extremities: trace non-pitting edema bilaterally  Lab Results:    Latest Ref Rng & Units 10/21/2022    1:09 AM 10/20/2022    8:35 AM 10/20/2022    8:27 AM  CBC  WBC 4.0 - 10.5 K/uL 11.2     Hemoglobin 13.0 - 17.0 g/dL 40.9  81.1  91.4   Hematocrit 39.0 - 52.0 % 44.0  41.0  40.0   Platelets 150 - 400 K/uL 253         Latest Ref Rng & Units 10/21/2022     1:09 AM 10/20/2022    8:35 AM 10/20/2022    8:27 AM  CMP  Glucose 70 - 99 mg/dL 95     BUN 8 - 23 mg/dL 25     Creatinine 7.82 - 1.24 mg/dL 9.56     Sodium 213 - 086 mmol/L 138  142  141   Potassium 3.5 - 5.1 mmol/L 3.6  3.5  3.4   Chloride 98 - 111 mmol/L 105     CO2 22 - 32 mmol/L 21     Calcium 8.9 - 10.3 mg/dL 8.5      No results for input(s): "GLUCAP" in the last 72 hours. Micro Results: No results found for this or any previous visit (from the past 240 hour(s)). Studies/Results: CARDIAC CATHETERIZATION  Result Date: 10/20/2022 Images from the original result were not included. LM: Normal LAD: No significant disease Ramus: No significant disease Lcx: Large, codominant. Very distal occlusion (unchanged since 07/2021) RCA: Small, codominant. 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 Scheduled Meds:  amiodarone  200 mg Oral BID   apixaban  5 mg Oral BID   aspirin EC  81 mg Oral Daily   atorvastatin  80 mg Oral Daily   dapagliflozin propanediol  10 mg Oral Daily   furosemide  40 mg Intravenous Once   isosorbide mononitrate  60 mg Oral Daily   lidocaine  1 patch Transdermal Q24H   [START ON 10/22/2022] metoprolol succinate  50 mg Oral Daily   nicotine  21 mg Transdermal Daily   sacubitril-valsartan  1 tablet Oral BID   sodium chloride flush  3 mL Intravenous Q12H   umeclidinium-vilanterol  1 puff Inhalation Daily   Continuous Infusions:  sodium chloride     PRN Meds:.sodium chloride, acetaminophen **OR** acetaminophen, acetaminophen, hydrOXYzine, ipratropium-albuterol, ondansetron (ZOFRAN) IV, mouth rinse, polyethylene glycol, sodium chloride  flush  Assessment/Plan: Principal Problem:   Atrial fibrillation with RVR (HCC) Active Problems:   HTN, goal below 130/80   Tobacco user   Acute on chronic systolic heart failure (HCC)   Alcohol use   Coronary artery disease involving native coronary artery of native heart   Acute on chronic congestive heart failure (HCC)   HFrEF (heart failure with reduced ejection fraction) (HCC)   Hilar lymphadenopathy   Tobacco abuse  Mr. Michael Bell is a 62 yo M with pertinent PMHx of NSTEMI, HFrEF (40-45) in 2023, HTN, and tobacco and alcohol use who presented with chest pain and SOB found to have new onset Afib with RVR and acute heart failure exacerbation  #Persistent Afib CHADSVAC score: 3 (HTN, NSTEMI, HFrEF). Possible etiologies include substance use history (ETOH, tobacco), uncontrolled hypertension, and concomitant uncontrolled HF. Rates have been controlled. HDS -Cardiology following, appreciate recs as discussed below:  -Cont PO amiodarone 200mg  BID -Cont Eliquis 5mg  BID  -Increased metoprolol succinate to 50mg  daily -Recommended outpatient cardioversion for rhythm control    #Acute on Chronic Heart Failure Exacerbation (EF 35-40%) #CHF NYHA class III-IV #Pulmonary hypertension  Likely 2/2 to medication non-adherence over the last 6 months due to financial hardship, uncontrolled HTN, and new onset Atrial fib with RVR. Prior ECHO done 08/05/2021 showed EF 40-45% with mildly reduced LV wall function. Repeat ECHO showed reducing EF 35-40% with elevated RA pressures and biatrial dilatation (7/23). LHC/RHC on 7/25 showed distal occlusion at LCX c/w prior cath on 07/2021 and RCA with diffuse 80-90% mid-vessel disease. Pulmonary hypertension noted with CO 4.9 and CI 2.0, pressures: PA 37/13mmHg, mPAP , PCW    -See cardiology recs below: -Cont IV Lasix 40mg  daily, likely can transition to PO in the next day or two -Continue GDMT as follows: -Resume Entresto at 49-51mg  BID  -Cont Farxiga  10mg  daily  -Increased metoprolol succinate to 50mg  daily  -Cont Imdur 60mg  daily -Strict I/Os, daily weights -Mg level -PT eval   #Elevated troponin  H/o CAD #C/f Angina LDL 103, has not taken home medications. Troponins: 42 --> 47 --> 40. Initial EKG showed Afib with some ST segment and T wave abnormalities c/f ischemia. Cath on 08/05/21 showed PDA occlusion and ulceration and up to 90% stenosis of the RCA which was medically managed. Suspect elevated troponins 2/2 non-adherence with medications and acute HF exacerbation. LHC/RHC performed on 7/25, see above for further details.   -Cont atorvastatin 80 mg daily -Cont bASA -Discontinued amlodipine 5mg   daily, per cards     #Enlarging hilar LAD  CT angio chest PE showed no evidence of an embolus but rather an enlarging mediastinal and bilateral hilar lymph nodes. Cannot exclude metastatic disease or lymphoma. Patient is lifelong tobacco user and not up to date on any of his cancer screenings with extensive family history of cancer. No B symptoms. Previously required 3L [place] but now on RA with normal sats. Consulted pulmonary for further management  -Pulmonary following, appreciate recs, recommended PET scan outpatient; scheduled fu with Dr. Delton Coombes outpatient in 4-6 weeks, likely requires EBUS with biopsy  -Cont Anoro Ellipita and PRN Duonebs  -Will need outpatient PFTs and sleep study   #Hypokalemia, resolved  Potassium WNL -Daily BMP    #Tobacco use disorder Not currently on outpatient therapy. Continues to smoke. Discuss therapies and started on nicotine patch  -Encourage smoking cessation -Continue nicotine patch 21mg     #Uncontrolled HTN  Has been unable to take home medications due to financial difficulties. -Continue antihypertensives as stated above    #H/o AAA Reported infrarenal abdominal aortic aneurysm 3.7 cm on U/S on 07/2021. Recommend follow-up ultrasound every 2 years -Outpatient follow-up   #SDOH Patient is uninsured,  working with case management/social work on this, as no insurance will be a barrier to outpatient tests/follow up/   Diet: Regular VTE: Eliquis  IVF: None, Code: Full   Prior to Admission Living Arrangement: Home, with spouse.  Anticipated Discharge Location: Home Barriers to Discharge: medical workup   Dispo: Anticipated discharge to home in 1-2 days pending improvement in volume status.   This is a Psychologist, occupational Note.  The care of the patient was discussed with Dr. Sol Blazing and the assessment and plan formulated with their assistance.  Please see their attached note for official documentation of the daily encounter.   LOS: 3 days   Loyal Buba 10/21/2022, 12:22 PM  Attestation for Student Documentation:  I personally was present and performed or re-performed the history, physical exam and medical decision-making activities of this service and have verified that the service and findings are accurately documented in the student's note.  Chauncey Mann, DO 10/21/2022, 1:10 PM

## 2022-10-21 NOTE — Progress Notes (Signed)
Mobility Specialist Progress Note:   10/21/22 1319  Mobility  Activity Ambulated with assistance in hallway  Level of Assistance Contact guard assist, steadying assist  Assistive Device None  Distance Ambulated (ft) 350 ft  Activity Response Tolerated well  Mobility Referral Yes  $Mobility charge 1 Mobility  Mobility Specialist Start Time (ACUTE ONLY) 1230  Mobility Specialist Stop Time (ACUTE ONLY) 1238  Mobility Specialist Time Calculation (min) (ACUTE ONLY) 8 min    During Mobility: 105 HR   Post Mobility: 93 HR , 94% SpO2  Pt received in bed, agreeable to mobility. Seated rest break required d/t SOB and lightheadedness. Pursed lip breathing encouraged. Pt requiring additional rest break on the way back to room d/t SOB. Unable to get accurate SpO2 during ambulation. Pt left in room asymptomatic with VSS and call bell in hand.     Leory Plowman  Mobility Specialist Please contact via [employer] office at [phone]

## 2022-10-21 NOTE — Plan of Care (Signed)

## 2022-10-21 NOTE — Progress Notes (Signed)
Subjective:  Feels much better. No specific complaints, Breathing improved  Diuresed well Afib rates 40s-150s   Current Facility-Administered Medications:    0.9 %  sodium chloride infusion, 250 mL, Intravenous, PRN, Patwardhan, Manish J, MD   acetaminophen (TYLENOL) tablet 650 mg, 650 mg, Oral, Q6H PRN, 650 mg at 10/19/22 1039 **OR** acetaminophen (TYLENOL) suppository 650 mg, 650 mg, Rectal, Q6H PRN, Champ Mungo, DO   acetaminophen (TYLENOL) tablet 650 mg, 650 mg, Oral, Q4H PRN, Patwardhan, Manish J, MD   amiodarone (PACERONE) tablet 200 mg, 200 mg, Oral, BID, Patwardhan, Manish J, MD, 200 mg at 10/21/22 0800   amLODipine (NORVASC) tablet 5 mg, 5 mg, Oral, Daily, Patwardhan, Manish J, MD, 5 mg at 10/21/22 0801   apixaban (ELIQUIS) tablet 5 mg, 5 mg, Oral, BID, Dickie La, MD, 5 mg at 10/21/22 0801   aspirin EC tablet 81 mg, 81 mg, Oral, Daily, Reome, Earle J, RPH, 81 mg at 10/21/22 0800   atorvastatin (LIPITOR) tablet 80 mg, 80 mg, Oral, Daily, Champ Mungo, DO, 80 mg at 10/21/22 0801   dapagliflozin propanediol (FARXIGA) tablet 10 mg, 10 mg, Oral, Daily, Dickie La, MD, 10 mg at 10/21/22 0802   furosemide (LASIX) injection 40 mg, 40 mg, Intravenous, Once, Annett Fabian, MD   hydrOXYzine (ATARAX) tablet 25 mg, 25 mg, Oral, TID PRN, Atway, Rayann N, DO, 25 mg at 10/19/22 2328   ipratropium-albuterol (DUONEB) 0.5-2.5 (3) MG/3ML nebulizer solution 3 mL, 3 mL, Nebulization, Q4H PRN, Suzie Portela, Donat D, PA-C   isosorbide mononitrate (IMDUR) 24 hr tablet 60 mg, 60 mg, Oral, Daily, Patwardhan, Manish J, MD, 60 mg at 10/21/22 0802   metoprolol succinate (TOPROL-XL) 24 hr tablet 25 mg, 25 mg, Oral, Daily, Patwardhan, Manish J, MD, 25 mg at 10/21/22 0802   nicotine (NICODERM CQ - dosed in mg/24 hours) patch 21 mg, 21 mg, Transdermal, Daily, Gwenevere Abbot, MD, 21 mg at 10/21/22 0803   ondansetron (ZOFRAN) injection 4 mg, 4 mg, Intravenous, Q6H PRN, Patwardhan, Manish J, MD   Oral care mouth rinse, 15 mL,  Mouth Rinse, PRN, Dickie La, MD   polyethylene glycol (MIRALAX / GLYCOLAX) packet 17 g, 17 g, Oral, Daily PRN, Champ Mungo, DO   sacubitril-valsartan (ENTRESTO) 24-26 mg per tablet, 1 tablet, Oral, BID, Atway, Rayann N, DO, 1 tablet at 10/21/22 0804   sodium chloride flush (NS) 0.9 % injection 3 mL, 3 mL, Intravenous, Q12H, Patwardhan, Manish J, MD, 3 mL at 10/21/22 0803   sodium chloride flush (NS) 0.9 % injection 3 mL, 3 mL, Intravenous, PRN, Patwardhan, Manish J, MD   umeclidinium-vilanterol (ANORO ELLIPTA) 62.5-25 MCG/ACT 1 puff, 1 puff, Inhalation, Daily, Pia Mau D, PA-C, 1 puff at 10/21/22 0800   Objective:  Vital Signs in the last 24 hours:     10/21/2022    8:10 AM 10/21/2022    3:46 AM 10/20/2022   11:41 PM  Vitals with BMI  Systolic 126 125 027  Diastolic 88 93 81  Pulse 62 81 92    Temp:  [98.2 F (36.8 C)-99 F (37.2 C)] 98.5 F (36.9 C) (07/26 0810) Pulse Rate:  [62-92] 62 (07/26 0810) Resp:  [18-20] 18 (07/26 0810) BP: (110-133)/(66-93) 126/88 (07/26 0810) SpO2:  [92 %-100 %] 100 % (07/26 0810)  Intake/Output from previous day: 07/25 0701 - 07/26 0700 In: -  Out: 1700 [Urine:1700]  Physical Exam Vitals and nursing note reviewed.  Constitutional:      General: He is not in acute distress. Neck:  Vascular: No JVD.  Cardiovascular:     Rate and Rhythm: Tachycardia present. Rhythm irregular.     Heart sounds: Normal heart sounds. No murmur heard. Pulmonary:     Effort: Pulmonary effort is normal.     Breath sounds: Rales (scattered) present. No decreased breath sounds or wheezing.  Musculoskeletal:     Right lower leg: No edema.     Left lower leg: No edema.     Imaging/tests reviewed and independently interpreted:   CTA chest 10/18/2022: No evidence of pulmonary embolus. Enlarging mediastinal and bilateral hilar lymph nodes. Cannot exclude metastasis or lymphoma. Consider further evaluation with PET CT. Small bilateral pleural effusions.   Bibasilar atelectasis. Cardiomegaly, vascular congestion. Coronary artery disease. Aortic Atherosclerosis (ICD10-I70.0).  Cardiac Studies:  Telemetry 10/19/2022: Afib, rates ranging from 140s during activity, to 40s during sleep  EKG 10/18/2022: Atrial fibrillation with rapid ventricular response Minimal voltage criteria for LVH, may be normal variant ( Cornell product ) ST & T wave abnormality, consider lateral ischemia Confirmed by Nicanor Alcon, April ([id number]) on 10/18/2022 1:17:36    Echocardiogram 10/18/2022:  1. Left ventricular ejection fraction, by estimation, is 35 to 40%. The  left ventricle has moderately decreased function. The left ventricle  demonstrates global hypokinesis. The left ventricular internal cavity size  was mildly dilated. There is mild eccentric left ventricular hypertrophy.  Left ventricular diastolic function could not be evaluated.   2. Right ventricular systolic function is normal. The right ventricular  size is mildly enlarged. Tricuspid regurgitation signal is inadequate for  assessing PA pressure.   3. Left atrial size was severely dilated.   4. Right atrial size was severely dilated.   5. The mitral valve is normal in structure. No evidence of mitral valve  regurgitation.   6. The aortic valve is tricuspid. There is mild calcification of the  aortic valve. Aortic valve regurgitation is not visualized. Aortic valve  sclerosis is present, with no evidence of aortic valve stenosis.   7. The inferior vena cava is dilated in size with >50% respiratory  variability, suggesting right atrial pressure of 8 mmHg.   Comparison(s): No significant change from prior study.   Right and left heart catheterization 10/20/2022: LM: Normal LAD: No significant disease Ramus: No significant disease Lcx: Large, codominant. Very distal occlusion (unchanged since 07/2021) RCA: Small, codominant. 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 Fib with RVR HR 85-110/min.   Lab Results  Component Value Date   NA 138 10/21/2022   K 3.6 10/21/2022   CO2 21 (L) 10/21/2022   GLUCOSE 95 10/21/2022   BUN 25 (H) 10/21/2022   CREATININE 1.30 (H) 10/21/2022   CALCIUM 8.5 (L) 10/21/2022   EGFR 79 10/05/2021   GFRNONAA >60 10/21/2022       Latest Ref Rng & Units 10/21/2022    1:09 AM 10/20/2022    8:35 AM 10/20/2022    8:27 AM  BMP  Glucose 70 - 99 mg/dL 95     BUN 8 - 23 mg/dL 25     Creatinine 6.21 - 1.24 mg/dL 3.08     Sodium 657 - 846 mmol/L 138  142  141   Potassium 3.5 - 5.1 mmol/L 3.6  3.5  3.4  Chloride 98 - 111 mmol/L 105     CO2 22 - 32 mmol/L 21     Calcium 8.9 - 10.3 mg/dL 8.5         Latest Ref Rng & Units 10/21/2022    1:09 AM 10/20/2022    8:35 AM 10/20/2022    8:27 AM  CBC  WBC 4.0 - 10.5 K/uL 11.2     Hemoglobin 13.0 - 17.0 g/dL 29.5  62.1  30.8   Hematocrit 39.0 - 52.0 % 44.0  41.0  40.0   Platelets 150 - 400 K/uL 253       Assessment & Recommendations:  Michael Bell is a 61 y.o.  Caucasian male  with hypertension, coronary and aortic arthrosclerosis, COPD, tobacco and alcohol abuse, infra-renal aortic aneurysm 3.7 cm ([place] 5/203), admitted with acute on chronic HFrEF, angina   Acute HFrEF: Mild LV/RV dilation, EF 35-40%, severe biatrial dilatation (echocardiogram 7/23). Non ischemic cardiomyopathy with CAD out of proportion to CAD. Suspect AF etiology for Cardiomyopathy and alcohol may be contributing  Medication noncompliance, underlying CAD.   Primary hypertension  Persistent atrial fibrillation.  Would recommend cardioversion in the OP basis to have rhythm control.   Net IO Since Admission: -5,421.67 mL [10/21/22 1108]  AAA: 3.7 infrarenal AAA ([place] 07/2021) OP [place] in 1 year. ARB with Sherryll Burger  will help along with Metporolol   Tobacco, alcohol abuse:   Rec:   I called his wife and explained the gravity of the situation. Advised her to rid of cigarettes, D/W patient regarding his CAD and cardiomyopathy and alcohol, diet changes.  Cr is very stable and I have increased Entresto to 49/51 mg BID and discontinued Amlodipine and increased Metoprolol succinate 50 mg daily   Continue Amio 200 mg BID and probably can be discharged home tomorrow.   We will see him in the office < 1 week to 10 days after discharge.    Yates Decamp, MD, [facility] 10/21/2022, 11:18 AM Office: [phone] Fax: [phone] Pager: [phone]  [phone] C

## 2022-10-22 ENCOUNTER — Other Ambulatory Visit (HOSPITAL_COMMUNITY): Payer: Self-pay

## 2022-10-22 MED ORDER — AMIODARONE HCL 200 MG PO TABS
200.0000 mg | ORAL_TABLET | Freq: Two times a day (BID) | ORAL | 0 refills | Status: DC
  Filled 2022-10-22: qty 60, 30d supply, fill #0

## 2022-10-22 MED ORDER — APIXABAN 5 MG PO TABS
5.0000 mg | ORAL_TABLET | Freq: Two times a day (BID) | ORAL | 0 refills | Status: DC
  Filled 2022-10-22: qty 60, 30d supply, fill #0

## 2022-10-22 MED ORDER — FUROSEMIDE 40 MG PO TABS
40.0000 mg | ORAL_TABLET | Freq: Every day | ORAL | Status: DC
  Administered 2022-10-22 – 2022-10-23 (×2): 40 mg via ORAL
  Filled 2022-10-22 (×2): qty 1

## 2022-10-22 MED ORDER — SACUBITRIL-VALSARTAN 49-51 MG PO TABS
1.0000 | ORAL_TABLET | Freq: Two times a day (BID) | ORAL | 0 refills | Status: DC
  Filled 2022-10-22: qty 60, 30d supply, fill #0

## 2022-10-22 MED ORDER — DAPAGLIFLOZIN PROPANEDIOL 10 MG PO TABS
10.0000 mg | ORAL_TABLET | Freq: Every day | ORAL | 0 refills | Status: DC
  Filled 2022-10-22: qty 30, 30d supply, fill #0

## 2022-10-22 MED ORDER — METOPROLOL SUCCINATE ER 50 MG PO TB24
50.0000 mg | ORAL_TABLET | Freq: Every day | ORAL | 0 refills | Status: DC
  Filled 2022-10-22: qty 30, 30d supply, fill #0

## 2022-10-22 MED ORDER — ALBUTEROL SULFATE HFA 108 (90 BASE) MCG/ACT IN AERS
1.0000 | INHALATION_SPRAY | RESPIRATORY_TRACT | 0 refills | Status: DC | PRN
  Filled 2022-10-22: qty 6.7, 25d supply, fill #0

## 2022-10-22 MED ORDER — ISOSORBIDE MONONITRATE ER 60 MG PO TB24
60.0000 mg | ORAL_TABLET | Freq: Every day | ORAL | 0 refills | Status: DC
  Filled 2022-10-22: qty 30, 30d supply, fill #0

## 2022-10-22 MED ORDER — SPIRONOLACTONE 25 MG PO TABS
12.5000 mg | ORAL_TABLET | Freq: Every day | ORAL | 0 refills | Status: DC
  Filled 2022-10-22: qty 30, 60d supply, fill #0

## 2022-10-22 MED ORDER — ATORVASTATIN CALCIUM 80 MG PO TABS
80.0000 mg | ORAL_TABLET | Freq: Every day | ORAL | 0 refills | Status: DC
  Filled 2022-10-22: qty 30, 30d supply, fill #0

## 2022-10-22 MED ORDER — ASPIRIN 81 MG PO TBEC
81.0000 mg | DELAYED_RELEASE_TABLET | Freq: Every day | ORAL | 0 refills | Status: DC
  Filled 2022-10-22: qty 120, 120d supply, fill #0

## 2022-10-22 MED ORDER — ANORO ELLIPTA 62.5-25 MCG/ACT IN AEPB
1.0000 | INHALATION_SPRAY | Freq: Every day | RESPIRATORY_TRACT | 1 refills | Status: DC
Start: 2022-10-22 — End: 2023-03-06
  Filled 2022-10-22: qty 60, 30d supply, fill #0
  Filled 2022-11-21 – 2022-12-20 (×3): qty 60, 30d supply, fill #1

## 2022-10-22 MED ORDER — SPIRONOLACTONE 12.5 MG HALF TABLET
12.5000 mg | ORAL_TABLET | Freq: Every day | ORAL | Status: DC
  Administered 2022-10-22 – 2022-10-23 (×2): 12.5 mg via ORAL
  Filled 2022-10-22 (×2): qty 1

## 2022-10-22 NOTE — Progress Notes (Addendum)
Subjective: Michael Bell is a 62 yo M with pertinent PMHx of NSTEMI, HFrEF (40-45) in 2023, HTN, and tobacco and alcohol use who presented with chest pain and SOB found to have new onset Afib with RVR and acute heart failure exacerbation  No longer reporting chest pain. States SOB is largely unchanged but has been able to walk around with less difficulty breathing.   Objective: Vital signs in last 24 hours: Vitals:   10/22/22 0347 10/22/22 0500 10/22/22 0741 10/22/22 0742  BP: (!) 129/104   (!) 126/97  Pulse: 74   89  Resp: 18   19  Temp: 98.5 F (36.9 C)   98 F (36.7 C)  TempSrc: Oral   Oral  SpO2: 96%  98% 95%  Weight:  109.9 kg    Height:       Weight change:   Intake/Output Summary (Last 24 hours) at 10/22/2022 0748 Last data filed at 10/22/2022 0743 Gross per 24 hour  Intake 1320 ml  Output 1650 ml  Net -330 ml   Physical exam:   BP (!) 126/97 (BP Location: Right Arm)   Pulse 89   Temp 98 F (36.7 C) (Oral)   Resp 19   Ht 6\' 3"  (1.905 m)   Wt 109.9 kg   SpO2 95%   BMI 30.28 kg/m   General appearance: NAD, awake, cooperative   Head: Normocephalic, without obvious abnormality, atraumatic Eyes: conjunctivae/corneas clear. EOM's intact.  Lungs:  faint crackles at bases bilaterally , no wheezing, normal WOB   Heart: irregularly irregular rhythm, regular rate, no m/r/g Abdomen:  soft, mildly distended abdomen. Non-tender on palpation. No masses Extremities: trace non-pitting edema bilaterally  Lab Results:    Latest Ref Rng & Units 10/21/2022    1:09 AM 10/20/2022    8:35 AM 10/20/2022    8:27 AM  CBC  WBC 4.0 - 10.5 K/uL 11.2     Hemoglobin 13.0 - 17.0 g/dL 45.4  09.8  11.9   Hematocrit 39.0 - 52.0 % 44.0  41.0  40.0   Platelets 150 - 400 K/uL 253         Latest Ref Rng & Units 10/22/2022   12:30 AM 10/21/2022    1:09 AM 10/20/2022    8:35 AM  CMP  Glucose 70 - 99 mg/dL 147  95    BUN 8 - 23 mg/dL 27  25    Creatinine 8.29 - 1.24 mg/dL 5.62  1.30     Sodium 865 - 145 mmol/L 135  138  142   Potassium 3.5 - 5.1 mmol/L 3.5  3.6  3.5   Chloride 98 - 111 mmol/L 103  105    CO2 22 - 32 mmol/L 21  21    Calcium 8.9 - 10.3 mg/dL 8.4  8.5     No results for input(s): "GLUCAP" in the last 72 hours. Micro Results: No results found for this or any previous visit (from the past 240 hour(s)). Studies/Results: CARDIAC CATHETERIZATION  Result Date: 10/20/2022 Images from the original result were not included. LM: Normal LAD: No significant disease Ramus: No significant disease Lcx: Large, codominant. Very distal occlusion (unchanged since 07/2021) RCA: Small, codominant. 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 Scheduled Meds:  amiodarone  200 mg Oral BID   apixaban  5 mg Oral BID   aspirin EC  81 mg Oral Daily   atorvastatin  80 mg Oral Daily   dapagliflozin propanediol  10 mg Oral Daily   furosemide  40 mg Oral Daily   isosorbide mononitrate  60 mg Oral Daily   lidocaine  1 patch Transdermal Q24H   metoprolol succinate  50 mg Oral Daily   nicotine  21 mg Transdermal Daily   sacubitril-valsartan  1 tablet Oral BID   sodium chloride flush  3 mL Intravenous Q12H   umeclidinium-vilanterol  1 puff Inhalation Daily   Continuous Infusions:  sodium chloride     PRN Meds:.sodium chloride, acetaminophen **OR** acetaminophen, acetaminophen, hydrOXYzine, ipratropium-albuterol, ondansetron (ZOFRAN) IV, mouth rinse, polyethylene glycol, sodium chloride flush  Assessment/Plan: Principal Problem:   Atrial fibrillation with RVR (HCC) Active Problems:   HTN, goal below 130/80   Tobacco user   Acute on chronic systolic heart  failure (HCC)   Alcohol use   Coronary artery disease involving native coronary artery of native heart   Acute on chronic congestive heart failure (HCC)   HFrEF (heart failure with reduced ejection fraction) (HCC)   Hilar lymphadenopathy   Tobacco abuse  Michael Bell is a 62 yo M with pertinent PMHx of NSTEMI, HFrEF (40-45) in 2023, HTN, and tobacco and alcohol use who presented with chest pain and SOB found to have new onset Afib with RVR and acute heart failure exacerbation  #Persistent Afib CHADSVAC score: 3 (HTN, NSTEMI, HFrEF). Possible etiologies include substance use history (ETOH, tobacco), uncontrolled hypertension, and concomitant uncontrolled HF. Rates have been controlled. HDS -Cardiology following, appreciate recs as discussed below:  -Cont PO amiodarone 200mg  BID -Cont Eliquis 5mg  BID  -Cont metoprolol succinate to 50mg  daily -Recommended outpatient cardioversion for rhythm control    #Acute on Chronic Heart Failure Exacerbation (EF 35-40%) #CHF NYHA class III-IV #Pulmonary hypertension  Likely 2/2 to medication non-adherence over the last 6 months due to financial hardship, uncontrolled HTN, and new onset Atrial fib with RVR. Prior ECHO done 08/05/2021 showed EF 40-45% with mildly reduced LV wall function. Repeat ECHO showed reducing EF 35-40% with elevated RA pressures and biatrial dilatation (7/23). LHC/RHC on 7/25 showed distal occlusion at LCX c/w prior cath on 07/2021 and RCA with diffuse 80-90% mid-vessel disease. Pulmonary hypertension noted with low cardiac outputs (CO 4.9, CI 2.0), pressures: PA 37/50mmHg, mPAP , PCW    -See cardiology recs below: -Transition to PO Lasix 40mg  daily, will monitor UOP today and can possibly discharge to home tomorrow -Continue GDMT as follows: -Cont Entresto 49-51mg  BID  -Cont Farxiga 10mg  daily  -Cont metoprolol succinate 50mg  daily  -Cont Imdur 60mg  daily -Strict I/Os, daily weights -Mg level -PT eval   #Elevated  troponin  H/o CAD #C/f Angina LDL 103, has not taken home medications. Troponins: 42 --> 47 --> 40. Initial EKG showed Afib with some ST segment and T wave abnormalities c/f ischemia. Cath on 08/05/21 showed PDA occlusion and ulceration and up to 90% stenosis of the RCA which was medically managed. Suspect elevated troponins 2/2 non-adherence with medications and acute HF exacerbation. LHC/RHC performed on 7/25, see above for further details.   -Cont atorvastatin 80 mg daily -Cont bASA -Recommend using incentive spirometer    #Enlarging hilar LAD  CT angio chest PE showed no evidence of an embolus but rather an enlarging mediastinal  and bilateral hilar lymph nodes. Cannot exclude metastatic disease or lymphoma. Patient is lifelong tobacco user and not up to date on any of his cancer screenings with extensive family history of cancer. No B symptoms. Previously required 3L [place] but now on RA with normal sats. Consulted pulmonary for further management  -Pulmonary following, appreciate recs, recommended PET scan outpatient; scheduled fu with Dr. Delton Coombes outpatient in 4-6 weeks, likely requires EBUS with biopsy  -Cont Anoro Ellipita and PRN Duonebs  -Will need outpatient PFTs and sleep study   #Hypokalemia, resolved  Potassium WNL -Daily BMP    #Tobacco use disorder Not currently on outpatient therapy. Continues to smoke. Discuss therapies and started on nicotine patch  -Encourage smoking cessation -Continue nicotine patch 21mg   -Consider including nicotine gum at discharge    #Uncontrolled HTN  Has been unable to take home medications due to financial difficulties. -Continue antihypertensives as stated above    #H/o AAA Reported infrarenal abdominal aortic aneurysm 3.7 cm on U/S on 07/2021. Recommend follow-up ultrasound every 2 years -Outpatient follow-up   #SDOH Patient is uninsured, working with case management/social work on this, as no insurance will be a barrier to outpatient  tests/follow up   Diet: Regular VTE: Eliquis  IVF: None Code: Full   Prior to Admission Living Arrangement: Home, with spouse.  Anticipated Discharge Location: Home Barriers to Discharge: medical workup   Dispo: Anticipated discharge to home in 1-2 days pending improvement in volume status.   This is a Psychologist, occupational Note.  The care of the patient was discussed with Dr. Ned Card and the assessment and plan formulated with their assistance.  Please see their attached note for official documentation of the daily encounter.   LOS: 4 days   Loyal Buba 10/22/2022, 7:48 AM  Attestation for Student Documentation:  I personally was present and performed or re-performed the history, physical exam and medical decision-making activities of this service and have verified that the service and findings are accurately documented in the student's note.  Chauncey Mann, DO 10/22/2022, 10:57 AM

## 2022-10-23 DIAGNOSIS — I5023 Acute on chronic systolic (congestive) heart failure: Secondary | ICD-10-CM

## 2022-10-23 LAB — CBC
HCT: 46.6 % (ref 39.0–52.0)
Hemoglobin: 15 g/dL (ref 13.0–17.0)
MCH: 26.4 pg (ref 26.0–34.0)
MCHC: 32.2 g/dL (ref 30.0–36.0)
MCV: 82 fL (ref 80.0–100.0)
Platelets: 258 10*3/uL (ref 150–400)
RBC: 5.68 MIL/uL (ref 4.22–5.81)
RDW: 14.1 % (ref 11.5–15.5)
WBC: 12.6 10*3/uL — ABNORMAL HIGH (ref 4.0–10.5)
nRBC: 0 % (ref 0.0–0.2)

## 2022-10-23 LAB — BASIC METABOLIC PANEL WITH GFR
Anion gap: 14 (ref 5–15)
BUN: 30 mg/dL — ABNORMAL HIGH (ref 8–23)
CO2: 19 mmol/L — ABNORMAL LOW (ref 22–32)
Calcium: 8.7 mg/dL — ABNORMAL LOW (ref 8.9–10.3)
Chloride: 103 mmol/L (ref 98–111)
Creatinine, Ser: 1.32 mg/dL — ABNORMAL HIGH (ref 0.61–1.24)
GFR, Estimated: >60 mL/min (ref >60)
Glucose, Bld: 127 mg/dL — ABNORMAL HIGH (ref 70–99)
Potassium: 3.7 mmol/L (ref 3.5–5.1)
Sodium: 136 mmol/L (ref 135–145)

## 2022-10-23 LAB — MAGNESIUM: Magnesium: 2.1 mg/dL (ref 1.7–2.4)

## 2022-10-23 MED ORDER — POTASSIUM CHLORIDE CRYS ER 20 MEQ PO TBCR
40.0000 meq | EXTENDED_RELEASE_TABLET | Freq: Once | ORAL | Status: AC
  Administered 2022-10-23: 40 meq via ORAL
  Filled 2022-10-23: qty 2

## 2022-10-23 NOTE — Plan of Care (Signed)

## 2022-10-23 NOTE — Progress Notes (Signed)
AVS given and explained to patient, TOC meds received by patient.

## 2022-10-24 ENCOUNTER — Other Ambulatory Visit (HOSPITAL_COMMUNITY): Payer: Self-pay

## 2022-10-24 ENCOUNTER — Telehealth: Payer: Self-pay

## 2022-10-24 NOTE — Transitions of Care (Post Inpatient/ED Visit) (Signed)
10/24/2022  Name: Michael Bell MRN: [id number] DOB: 01-14-1961  Today's TOC FU Call Status: Today's TOC FU Call Status:: Successful TOC FU Call Competed TOC FU Call Complete Date: 10/24/22  Transition Care Management Follow-up Telephone Call Date of Discharge: 10/23/22 Discharge Facility: [facility]) Type of Discharge: Inpatient Admission Primary Inpatient Discharge Diagnosis:: afib How have you been since you were released from the hospital?: Better Any questions or concerns?: No  Items Reviewed: Did you receive and understand the discharge instructions provided?: Yes Medications obtained,verified, and reconciled?: Yes (Medications Reviewed) Any new allergies since your discharge?: No Dietary orders reviewed?: Yes Do you have support at home?: Yes People in Home: spouse  Medications Reviewed Today: Medications Reviewed Today     Reviewed by Karena Addison, LPN (Licensed Practical Nurse) on 10/24/22 at 1030  Med List Status: <None>   Medication Order Taking? Sig Documenting Provider Last Dose Status Informant  albuterol (VENTOLIN HFA) 108 (90 Base) MCG/ACT inhaler [id number]  Inhale 1-2 puffs into the lungs every 4 (four) hours as needed for wheezing or shortness of breath. Chauncey Mann, DO  Active   amiodarone (PACERONE) 200 MG tablet [id number]  Take 1 tablet (200 mg total) by mouth 2 (two) times daily. Chauncey Mann, DO  Active   apixaban (ELIQUIS) 5 MG TABS tablet [id number]  Take 1 tablet (5 mg total) by mouth 2 (two) times daily. Chauncey Mann, DO  Active   aspirin EC 81 MG tablet [id number]  Take 1 tablet (81 mg total) by mouth daily. Swallow whole. Chauncey Mann, DO  Active   atorvastatin (LIPITOR) 80 MG tablet [id number]  Take 1 tablet (80 mg total) by mouth daily. Atway, Rayann N, DO  Active   dapagliflozin propanediol (FARXIGA) 10 MG TABS tablet [id number]  Take 1 tablet (10 mg total) by mouth daily. Chauncey Mann, DO  Active   fluticasone (FLONASE) 50  MCG/ACT nasal spray [id number] No Place 2 sprays into both nostrils daily.  Patient not taking: Reported on 10/18/2022   Marrianne Mood, MD Not Taking Active Self, Pharmacy Records  isosorbide mononitrate (IMDUR) 60 MG 24 hr tablet [id number]  Take 1 tablet (60 mg total) by mouth daily. Atway, Rayann N, DO  Active   metoprolol succinate (TOPROL-XL) 50 MG 24 hr tablet [id number]  Take 1 tablet (50 mg total) by mouth daily. Take with or immediately following a meal. Atway, Rayann N, DO  Active   ranolazine (RANEXA) 500 MG 12 hr tablet [id number] No Take 1 tablet (500 mg total) by mouth 2 (two) times daily.  Patient not taking: Reported on 10/18/2022   Tessa Lerner, DO Not Taking Active Self, Pharmacy Records  sacubitril-valsartan [facility]) 49-51 MG [id number]  Take 1 tablet by mouth 2 (two) times daily. Chauncey Mann, DO  Active   spironolactone (ALDACTONE) 25 MG tablet [id number]  Take 1/2 tablet (12.5 mg total) by mouth daily. Atway, Rayann N, DO  Active   umeclidinium-vilanterol (ANORO ELLIPTA) 62.5-25 MCG/ACT AEPB [id number]  Inhale 1 puff into the lungs daily. Chauncey Mann, DO  Active             Home Care and Equipment/Supplies: Were Home Health Services Ordered?: NA Any new equipment or medical supplies ordered?: NA  Functional Questionnaire: Do you need assistance with bathing/showering or dressing?: No Do you need assistance with meal preparation?: No Do you need assistance with eating?: No Do you have difficulty maintaining continence: No Do you need assistance with getting  out of bed/getting out of a chair/moving?: No  Follow up appointments reviewed: PCP Follow-up appointment confirmed?: Yes Date of PCP follow-up appointment?: 11/08/22 Follow-up Provider: [facility] Follow-up appointment confirmed?: Yes Date of Specialist follow-up appointment?: 11/01/22 Follow-Up Specialty Provider:: Cardio Do you need transportation to your follow-up appointment?:  No Do you understand care options if your condition(s) worsen?: Yes-patient verbalized understanding    SIGNATURE Karena Addison, LPN [facility] Nurse Health Advisor Direct Dial [phone]

## 2022-11-01 ENCOUNTER — Ambulatory Visit: Payer: Medicaid Other | Admitting: Cardiology

## 2022-11-03 ENCOUNTER — Ambulatory Visit: Payer: Medicaid Other | Admitting: Cardiology

## 2022-11-08 ENCOUNTER — Other Ambulatory Visit (HOSPITAL_COMMUNITY): Payer: Self-pay

## 2022-11-08 ENCOUNTER — Ambulatory Visit (INDEPENDENT_AMBULATORY_CARE_PROVIDER_SITE_OTHER): Payer: Medicaid Other | Admitting: Student

## 2022-11-08 ENCOUNTER — Encounter: Payer: Self-pay | Admitting: Student

## 2022-11-08 VITALS — BP 94/65 | HR 49 | Temp 98.2°F | Ht 74.0 in | Wt 251.6 lb

## 2022-11-08 DIAGNOSIS — F17210 Nicotine dependence, cigarettes, uncomplicated: Secondary | ICD-10-CM | POA: Diagnosis not present

## 2022-11-08 DIAGNOSIS — R590 Localized enlarged lymph nodes: Secondary | ICD-10-CM | POA: Diagnosis not present

## 2022-11-08 DIAGNOSIS — I4891 Unspecified atrial fibrillation: Secondary | ICD-10-CM | POA: Diagnosis not present

## 2022-11-08 DIAGNOSIS — I5023 Acute on chronic systolic (congestive) heart failure: Secondary | ICD-10-CM

## 2022-11-08 DIAGNOSIS — I5020 Unspecified systolic (congestive) heart failure: Secondary | ICD-10-CM | POA: Diagnosis not present

## 2022-11-08 MED ORDER — FUROSEMIDE 40 MG PO TABS
40.0000 mg | ORAL_TABLET | Freq: Every day | ORAL | 11 refills | Status: DC
  Filled 2022-11-08: qty 30, 30d supply, fill #0

## 2022-11-08 MED ORDER — FUROSEMIDE 40 MG PO TABS
40.0000 mg | ORAL_TABLET | Freq: Every day | ORAL | 0 refills | Status: DC
  Filled 2022-11-08: qty 30, 30d supply, fill #0

## 2022-11-08 NOTE — Assessment & Plan Note (Addendum)
Patient with worsening of HF noted during hospitalization with EF of 35-40% (10/18/22). Patient started on Entresto 49-51 mg/daily, Farxiga 10 mg/daily, Toprol-XL 50 mg/daily, Spironolactone 25 mg/daily, and IMDUR 60 mg/daily. Patient without overt signs of fluid overload on exam but patient is up 9 pounds since discharge. Patient continues to endorse dyspnea and orthopnea. Patient is bradycardic at 51 with repeat at 49 and BP soft at 94/65. Notably, initial HR was taken as soon as patient walked into room. Patient has cardiology follow-up 8/16.  -Appreciate cardiology recommendations -Reduced Toprol to 25 mg/daily pending cardiology follow-up -Hold Entresto pending cardiology follow-up -Lasix 40 mg daily pending cardiology follow-up -Patient unable to pay for BMP but should have Medicaid coverage in two weeks. Ordered future BMP. Patient instructed to call when coverage begins.

## 2022-11-08 NOTE — Patient Instructions (Addendum)
Thank you for allowing me to be a part of your care team. Today we discussed a few things:  STOP taking Entresto until you see cardiology on 8/16. Only take 1/2 of your Metoprolol (so only 25 mg) until you see cardiology. I will send in a prescription for 40 mg Lasix to the [facility]. Please follow-up with Pulmonology later this month. Please work on cutting down on your cigarettes. We can discuss options for smoking cessation at your follow-up with [place].

## 2022-11-08 NOTE — Progress Notes (Signed)
CC: Follow-up  HPI:  [name] is a 62 y.o. male living with a history stated below and presents today for hospital follow-up after recent discharge for A-Fib with RVR and HFrEF . Please see problem based assessment and plan for additional details.  Past Medical History:  Diagnosis Date   Acute diastolic heart failure (HCC)    Arthritis    CHF (congestive heart failure) (HCC)    Headache    Hypertension    Hypertensive urgency     Current Outpatient Medications on File Prior to Visit  Medication Sig Dispense Refill   albuterol (VENTOLIN HFA) 108 (90 Base) MCG/ACT inhaler Inhale 1-2 puffs into the lungs every 4 (four) hours as needed for wheezing or shortness of breath. 6.7 g 0   amiodarone (PACERONE) 200 MG tablet Take 1 tablet (200 mg total) by mouth 2 (two) times daily. 60 tablet 0   apixaban (ELIQUIS) 5 MG TABS tablet Take 1 tablet (5 mg total) by mouth 2 (two) times daily. 60 tablet 0   aspirin EC 81 MG tablet Take 1 tablet (81 mg total) by mouth daily. Swallow whole. 120 tablet 0   atorvastatin (LIPITOR) 80 MG tablet Take 1 tablet (80 mg total) by mouth daily. 30 tablet 0   dapagliflozin propanediol (FARXIGA) 10 MG TABS tablet Take 1 tablet (10 mg total) by mouth daily. 30 tablet 0   fluticasone (FLONASE) 50 MCG/ACT nasal spray Place 2 sprays into both nostrils daily. (Patient not taking: Reported on 10/18/2022) 16 g 2   isosorbide mononitrate (IMDUR) 60 MG 24 hr tablet Take 1 tablet (60 mg total) by mouth daily. 30 tablet 0   metoprolol succinate (TOPROL-XL) 50 MG 24 hr tablet Take 1 tablet (50 mg total) by mouth daily. Take with or immediately following a meal. 30 tablet 0   ranolazine (RANEXA) 500 MG 12 hr tablet Take 1 tablet (500 mg total) by mouth 2 (two) times daily. (Patient not taking: Reported on 10/18/2022) 180 tablet 0   sacubitril-valsartan (ENTRESTO) 49-51 MG Take 1 tablet by mouth 2 (two) times daily. 60 tablet 0   spironolactone (ALDACTONE) 25 MG tablet Take  1/2 tablet (12.5 mg total) by mouth daily. 30 tablet 0   umeclidinium-vilanterol (ANORO ELLIPTA) 62.5-25 MCG/ACT AEPB Inhale 1 puff into the lungs daily. 60 each 1   No current facility-administered medications on file prior to visit.    Family History  Problem Relation Age of Onset   Ovarian cancer Mother 37       primary peritoneal cancer   Cancer Mother    Diabetes Mother    Heart disease Mother    Hyperlipidemia Mother    Hypertension Mother    Stroke Mother    Mental illness Mother    Colon polyps Father        18 on last colonoscopy   Heart disease Father    Hyperlipidemia Father    Hypertension Father    Stroke Father    Cancer Sister    Colon cancer Sister 46       cancer of the sigmoid colon   Heart disease Brother    Hypertension Brother    Heart disease Brother    Hyperlipidemia Brother    Hypertension Brother    Down syndrome Paternal Aunt    Cancer Paternal Uncle        cancer of the spine   COPD Maternal Grandmother    Stomach cancer Paternal Grandmother     Social  History   Socioeconomic History   Marital status: Married    Spouse name: Not on file   Number of children: Not on file   Years of education: Not on file   Highest education level: Not on file  Occupational History   Not on file  Tobacco Use   Smoking status: Every Day    Current packs/day: 1.50    Average packs/day: 1.5 packs/day for 0.6 years (0.9 ttl pk-yrs)    Types: Cigarettes    Start date: 51    Last attempt to quit: 2023   Smokeless tobacco: Never  Vaping Use   Vaping status: Never Used  Substance and Sexual Activity   Alcohol use: Not Currently   Drug use: Not Currently    Types: Marijuana    Comment: 12/26/13   Sexual activity: Not on file  Other Topics Concern   Not on file  Social History Narrative   ** Merged History Encounter **       Social Determinants of Health   Financial Resource Strain: Not on file  Food Insecurity: No Food Insecurity (10/18/2022)    Hunger Vital Sign    Worried About Running Out of Food in the Last Year: Never true    Ran Out of Food in the Last Year: Never true  Transportation Needs: No Transportation Needs (10/18/2022)   PRAPARE - Administrator, Civil Service (Medical): No    Lack of Transportation (Non-Medical): No  Physical Activity: Not on file  Stress: Not on file  Social Connections: Not on file  Intimate Partner Violence: Not At Risk (10/18/2022)   Humiliation, Afraid, Rape, and Kick questionnaire    Fear of Current or Ex-Partner: No    Emotionally Abused: No    Physically Abused: No    Sexually Abused: No    Review of Systems: ROS negative except for what is noted on the assessment and plan.  Vitals:   11/08/22 0851 11/08/22 0930  BP: (!) 117/94 94/65  Pulse: (!) 51 (!) 49  Temp: 98.2 F (36.8 C)   TempSrc: Oral   SpO2: 97%   Weight: 251 lb 9.6 oz (114.1 kg)   Height: 6\' 2"  (1.88 m)     Physical Exam: Constitutional: obese, sitting in chair, in no acute distress Cardiovascular: regular rate and rhythm, no m/r/g, no LEE Pulmonary/Chest: increased work of breathing on room air, lungs clear to auscultation bilaterally, diminished breath sounds bilaterally Skin: warm and dry Psych: normal mood and behavior  Assessment & Plan:     Patient seen with Dr. Lafonda Mosses  HFrEF (heart failure with reduced ejection fraction) (HCC) Patient with worsening of HF noted during hospitalization with EF of 35-40% (10/18/22). Patient started on Entresto 49-51 mg/daily, Farxiga 10 mg/daily, Toprol-XL 50 mg/daily, Spironolactone 25 mg/daily, and IMDUR 60 mg/daily. Patient without overt signs of fluid overload on exam but patient is up 9 pounds since discharge. Patient continues to endorse dyspnea and orthopnea. Patient is bradycardic at 51 with repeat at 49 and BP soft at 94/65. Notably, initial HR was taken as soon as patient walked into room. Patient has cardiology follow-up 8/16.  -Appreciate cardiology  recommendations -Reduced Toprol to 25 mg/daily pending cardiology follow-up -Hold Entresto pending cardiology follow-up -Lasix 40 mg daily pending cardiology follow-up -Patient unable to pay for BMP but should have Medicaid coverage in two weeks. Ordered future BMP. Patient instructed to call when coverage begins.  Atrial fibrillation with RVR (HCC) CHA2DS2VAsc is 3. Managed with Amiodarone 200  mg BID, Eliquis 5 mg BID, and Toprol-XL 50 mg/daily. Patient endorsing dyspnea. HR's of 51 and 49 today.  -Decreased Toprol-XL to 25 mg/daily (see above) -Appreciate cardiology recommendations  Hilar lymphadenopathy Patient noted to have bilateral hilar LAD on initial CT chest during hospitalization. Pulmonology appointment with Dr. Delton Coombes scheduled for 8/22. Patient has extensive smoking history and never had low-dose lung CT. -Appreciate Pulmonology recommendations   Carmina Miller, D.O. [facility] Internal Medicine, PGY-1 Phone: [phone] Date 11/08/2022 Time 3:43 PM

## 2022-11-08 NOTE — Assessment & Plan Note (Addendum)
CHA2DS2VAsc is 3. Managed with Amiodarone 200 mg BID, Eliquis 5 mg BID, and Toprol-XL 50 mg/daily. Patient endorsing dyspnea. HR's of 51 and 49 today.  -Decreased Toprol-XL to 25 mg/daily (see above) -Appreciate cardiology recommendations

## 2022-11-08 NOTE — Assessment & Plan Note (Signed)
Patient noted to have bilateral hilar LAD on initial CT chest during hospitalization. Pulmonology appointment with Dr. Delton Coombes scheduled for 8/22. Patient has extensive smoking history and never had low-dose lung CT. -Appreciate Pulmonology recommendations

## 2022-11-09 NOTE — Progress Notes (Signed)
Internal Medicine Clinic Attending  I was physically present during the key portions of the resident provided service and participated in the medical decision making of patient's management care. I reviewed pertinent patient test results.  The assessment, diagnosis, and plan were formulated together and I agree with the documentation in the resident's note.  Mercie Eon, MD    9 lb weight gain since hospital discharge. He is warm on exam.  I agree with plan to reduce Metoprolol to 25mg  daily, hold Entresto until he sees cardiology later this week for repeat BP check, and start lasix 40mg  PO daily.  I have messaged his cardiologist to inform of these changes.  Hopefully we can get labs at his upcoming appointment - cost is the main barrier. If Cardiology can get [facility] for free, this is preferred- if not, we can prescribe ACE/ARB through [facility] pharmacy

## 2022-11-11 ENCOUNTER — Ambulatory Visit: Payer: Medicaid Other | Admitting: Cardiology

## 2022-11-11 ENCOUNTER — Other Ambulatory Visit (HOSPITAL_COMMUNITY): Payer: Self-pay

## 2022-11-11 ENCOUNTER — Encounter: Payer: Self-pay | Admitting: Cardiology

## 2022-11-11 VITALS — BP 119/99 | HR 66 | Resp 16 | Ht 74.0 in | Wt 249.6 lb

## 2022-11-11 DIAGNOSIS — I10 Essential (primary) hypertension: Secondary | ICD-10-CM

## 2022-11-11 DIAGNOSIS — F17200 Nicotine dependence, unspecified, uncomplicated: Secondary | ICD-10-CM

## 2022-11-11 DIAGNOSIS — I5022 Chronic systolic (congestive) heart failure: Secondary | ICD-10-CM

## 2022-11-11 DIAGNOSIS — I700 Atherosclerosis of aorta: Secondary | ICD-10-CM

## 2022-11-11 DIAGNOSIS — I4819 Other persistent atrial fibrillation: Secondary | ICD-10-CM

## 2022-11-11 DIAGNOSIS — Z7901 Long term (current) use of anticoagulants: Secondary | ICD-10-CM

## 2022-11-11 DIAGNOSIS — Z789 Other specified health status: Secondary | ICD-10-CM

## 2022-11-11 DIAGNOSIS — I2510 Atherosclerotic heart disease of native coronary artery without angina pectoris: Secondary | ICD-10-CM

## 2022-11-11 DIAGNOSIS — I7143 Infrarenal abdominal aortic aneurysm, without rupture: Secondary | ICD-10-CM

## 2022-11-11 MED ORDER — AMIODARONE HCL 200 MG PO TABS
200.0000 mg | ORAL_TABLET | Freq: Every day | ORAL | 0 refills | Status: DC
Start: 2022-11-11 — End: 2023-02-07
  Filled 2022-11-11 – 2022-11-21 (×2): qty 90, 90d supply, fill #0

## 2022-11-11 MED ORDER — RANOLAZINE ER 500 MG PO TB12
500.0000 mg | ORAL_TABLET | Freq: Two times a day (BID) | ORAL | 0 refills | Status: DC
Start: 2022-11-11 — End: 2023-02-07
  Filled 2022-11-11 – 2022-11-29 (×2): qty 180, 90d supply, fill #0

## 2022-11-11 MED ORDER — METOPROLOL SUCCINATE ER 50 MG PO TB24
50.0000 mg | ORAL_TABLET | Freq: Every day | ORAL | 0 refills | Status: DC
Start: 2022-11-11 — End: 2023-02-07
  Filled 2022-11-11 – 2022-11-21 (×2): qty 90, 90d supply, fill #0

## 2022-11-11 MED ORDER — SPIRONOLACTONE 25 MG PO TABS
12.5000 mg | ORAL_TABLET | Freq: Every day | ORAL | 0 refills | Status: DC
Start: 2022-11-11 — End: 2023-02-07
  Filled 2022-11-11 – 2022-12-19 (×2): qty 30, 60d supply, fill #0

## 2022-11-11 MED ORDER — ISOSORBIDE MONONITRATE ER 60 MG PO TB24
60.0000 mg | ORAL_TABLET | Freq: Every day | ORAL | 0 refills | Status: DC
Start: 2022-11-11 — End: 2023-02-07
  Filled 2022-11-11 – 2022-11-21 (×2): qty 90, 90d supply, fill #0

## 2022-11-11 NOTE — Progress Notes (Signed)
ID:  Michael Bell, DOB Oct 09, 1960, MRN [id number]  PCP:  Marrianne Mood, MD  Cardiologist:  Tessa Lerner, DO, [facility] (established care 08/04/2021)  Date: 11/11/22 Last Office Visit: 09/23/2021  Chief Complaint  Patient presents with   Chronic combined systolic and diastolic heart failure [facility])   Coronary Artery Disease   Hospitalization Follow-up    HPI  Michael Bell is a 62 y.o. Caucasian male whose past medical history and cardiovascular risk factors include: Chronic HFrEF, persistent atrial fibrillation, NSTEMI (May 2023), hypertension, smoker (started at the age of 18 averaging 2 to 3 packs/day until the age of 66 now down to 0.5 packs/day), excessive alcohol consumption, marijuana use, coronary and aortic atherosclerosis (nongated CT study 12/2020), emphysema, infrarenal aortic aneurysm.  Patient was last seen in the office in June 2023 and since then has been lost to follow-up predominantly due to loss of insurance coverage.  In May 2023 he ruled in for NSTEMI underwent angiography and was found to have disease in the RCA and L-PDA see report for detail.  Given his cardiomyopathy and systolic and diastolic heart failure he was started on medical therapy and was asked to follow-up closely.  However he lost insurance in January 2024 and has been out of majority of his medications.  However, due to worsening dyspnea he presented to the hospital in July 2024.  Hospitalization records reviewed imaging studies updated and noted below for further reference.  He presents today for follow-up.  He denies anginal chest pain however has shortness of breath predominantly with effort related activities.  He denies orthopnea/PND and very minimal lower extremity swelling.  When he recently saw his PCP Michael Bell was held secondary to soft blood pressures.  He was asked to continue his diuretics.  Patient was unable to afford getting labs done due to it being cost prohibitive.  He plans to have them  done next week as he should have money by then.  He is requesting for refills.  Patient states that he used to smoke 3 packs/day and is down to 0.5 packs/day.  He is motivated with regards to completes cessation given his overall clinical trajectory.  He also has an appointment with pulmonary medicine in the coming weeks to discuss management of enlarging lymph nodes that were recently seen on CT of the chest and July 2024.  During his hospitalization in July 2024 he was diagnosed with new onset of atrial fibrillation.  Currently on amiodarone, metoprolol, and Eliquis.  He does not endorse any evidence of bleeding.  He continues to have chest pain-predominately noncardiac based on symptoms.  Recently had angiography as noted below.  FUNCTIONAL STATUS: No structured exercise program or daily routine.   ALLERGIES: No Known Allergies  MEDICATION LIST PRIOR TO VISIT: Current Meds  Medication Sig   albuterol (VENTOLIN HFA) 108 (90 Base) MCG/ACT inhaler Inhale 1-2 puffs into the lungs every 4 (four) hours as needed for wheezing or shortness of breath.   apixaban (ELIQUIS) 5 MG TABS tablet Take 1 tablet (5 mg total) by mouth 2 (two) times daily.   aspirin EC 81 MG tablet Take 1 tablet (81 mg total) by mouth daily. Swallow whole.   atorvastatin (LIPITOR) 80 MG tablet Take 1 tablet (80 mg total) by mouth daily.   dapagliflozin propanediol (FARXIGA) 10 MG TABS tablet Take 1 tablet (10 mg total) by mouth daily.   fluticasone (FLONASE) 50 MCG/ACT nasal spray Place 2 sprays into both nostrils daily.   furosemide (LASIX) 40  MG tablet Take 1 tablet (40 mg total) by mouth daily.   ranolazine (RANEXA) 500 MG 12 hr tablet Take 1 tablet (500 mg total) by mouth 2 (two) times daily.   umeclidinium-vilanterol (ANORO ELLIPTA) 62.5-25 MCG/ACT AEPB Inhale 1 puff into the lungs daily.   [DISCONTINUED] amiodarone (PACERONE) 200 MG tablet Take 1 tablet (200 mg total) by mouth 2 (two) times daily.   [DISCONTINUED]  isosorbide mononitrate (IMDUR) 60 MG 24 hr tablet Take 1 tablet (60 mg total) by mouth daily.   [DISCONTINUED] metoprolol succinate (TOPROL-XL) 50 MG 24 hr tablet Take 1 tablet (50 mg total) by mouth daily. Take with or immediately following a meal.   [DISCONTINUED] spironolactone (ALDACTONE) 25 MG tablet Take 1/2 tablet (12.5 mg total) by mouth daily.     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;  Surgeon: Kerrin Champagne, MD;  Location: [facility] OR;  Service: Orthopedics;  Laterality: N/A;   NOSE SURGERY     40 yrs ago   RIGHT/LEFT HEART CATH AND CORONARY ANGIOGRAPHY N/A 08/05/2021   Procedure: RIGHT/LEFT HEART CATH AND CORONARY ANGIOGRAPHY;  Surgeon: Yates Decamp, MD;  Location: [facility] INVASIVE CV LAB;  Service: Cardiovascular;  Laterality: N/A;   RIGHT/LEFT HEART CATH AND CORONARY ANGIOGRAPHY N/A 10/20/2022   Procedure: RIGHT/LEFT HEART CATH AND CORONARY ANGIOGRAPHY;  Surgeon: Elder Negus, MD;  Location: [facility] INVASIVE CV LAB;  Service: Cardiovascular;  Laterality: N/A;    FAMILY HISTORY: The patient family history includes COPD in his maternal grandmother; Cancer in his mother, paternal uncle, and sister; Colon cancer (age of onset: 5) in his sister; Colon polyps in his father; Diabetes in his mother; Down syndrome in his paternal aunt; Heart disease in his brother, brother, father, and mother; Hyperlipidemia in his brother, father, and mother; Hypertension in his brother, brother, father, and mother; Mental illness in his mother; Ovarian cancer (age of onset: 68) in his mother; Stomach cancer in his  paternal grandmother; Stroke in his father and mother.  SOCIAL HISTORY:  The patient  reports that he has been smoking cigarettes. He started smoking about 39 years ago. He has a 0.9 pack-year smoking history. He has never used smokeless tobacco. He reports that he does not currently use alcohol. He reports that he does not currently use drugs after having used the following drugs: Marijuana.  REVIEW OF SYSTEMS: Review of Systems  Constitutional: Positive for malaise/fatigue.  Cardiovascular:  Positive for chest pain, dyspnea on exertion and leg swelling (improved). Negative for claudication, irregular heartbeat, near-syncope, orthopnea, palpitations, paroxysmal nocturnal dyspnea and syncope.  Respiratory:  Negative for shortness of breath.   Hematologic/Lymphatic: Negative for bleeding problem.  Musculoskeletal:  Negative for muscle cramps and myalgias.  Neurological:  Negative for dizziness and light-headedness.    PHYSICAL EXAM:    11/11/2022   11:03 AM 11/11/2022   10:34 AM 11/08/2022    9:30 AM  Vitals with BMI  Height  6\' 2"    Weight  249 lbs 10 oz   BMI  32.03   Systolic 119 186 94  Diastolic 99  108 65  Pulse 66 58 49    CONSTITUTIONAL: Appears older than stated age, fatigued, hemodynamically stable, no acute distress.  SKIN: Skin is warm and dry. No rash noted. No cyanosis. No pallor. No jaundice.  Tattoos present. HEAD: Normocephalic and atraumatic.  EYES: No scleral icterus MOUTH/THROAT: Moist oral membranes.  NECK: No JVD present. No thyromegaly noted. No carotid bruits  CHEST Normal respiratory effort. No intercostal retractions  LUNGS: Decreased breath sounds bilaterally, poor inspiratory effort, no wheezes rales or rhonchi's.   CARDIOVASCULAR: Irregularly irregular, variable S1-S2, subtle systolic murmur heard at the apex, no gallops or rubs.   ABDOMINAL: Obese, soft, nontender, nondistended, positive bowel sounds in all 4 quadrants, no apparent ascites.   EXTREMITIES: No peripheral edema, warm to touch, 2+ bilateral DP and PT pulses HEMATOLOGIC: No significant bruising NEUROLOGIC: Oriented to person, place, and time. Nonfocal. Normal muscle tone.  PSYCHIATRIC: Normal mood and affect. Normal behavior. Cooperative  RADIOLOGY:  CTA PE Protocol 12/2020: Cardiovascular: No filling defects in the pulmonary arteries to suggest pulmonary emboli. Heart is normal size. Scattered coronary artery and aortic calcifications. No aneurysm.   Lungs/Pleura: Emphysema. Lungs are clear. No focal airspace opacities or suspicious nodules. No effusions.  CTA chest 10/18/2022: No evidence of pulmonary embolus. Enlarging mediastinal and bilateral hilar lymph nodes. Cannot exclude metastasis or lymphoma. Consider further evaluation with PET CT. Small bilateral pleural effusions.  Bibasilar atelectasis. Cardiomegaly, vascular congestion. Coronary artery disease. Aortic Atherosclerosis (ICD10-I70.0).   Abdominal ultrasound: 08/05/2021: Infrarenal abdominal aortic aneurysm 3.7 cm. Recommend follow-up ultrasound every 2 years. This recommendation follows ACR consensus guidelines: White Paper of the ACR Incidental Findings Committee II on Vascular Findings. J Am Coll Radiol 2013; 10:789-794.   CARDIAC DATABASE: EKG: 11/11/2022: Atrial fibrillation, 76 bpm, without underlying ischemia or injury pattern.   Echocardiogram: 08/05/2021: 40 to 45%, with regional wall motion abnormalities, grade 2 diastolic dysfunction, elevated left atrial pressure, biatrial moderately dilated, estimated RAP 15 mmHg.  10/18/2022:  1. Left ventricular ejection fraction, by estimation, is 35 to 40%. The left ventricle has moderately decreased function. The left ventricle  demonstrates global hypokinesis. The left ventricular internal cavity size  was mildly dilated. There is mild eccentric left ventricular hypertrophy.  Left ventricular diastolic function could not be evaluated.   2. Right  ventricular systolic function is normal. The right ventricular size is mildly enlarged. Tricuspid regurgitation signal is inadequate for assessing PA pressure.   3. Left atrial size was severely dilated.   4. Right atrial size was severely dilated.   5. The mitral valve is normal in structure. No evidence of mitral valve regurgitation.   6. The aortic valve is tricuspid. There is mild calcification of the aortic valve. Aortic valve regurgitation is not visualized. Aortic valve  sclerosis is present, with no evidence of aortic valve stenosis.   7. The inferior vena cava is dilated in size with >50% respiratory variability, suggesting right atrial pressure of 8 mmHg.    Stress test: None   Heart catheterization: 09/30/2022 LM: Normal LAD: No significant disease Ramus: No significant disease Lcx: Large, codominant. Very distal occlusion (unchanged since 07/2021) RCA: Small, codominant. 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  There appears  to be a suprarenal abdominal attic aneurysm.  Renal arteries could not be well visualized.  Aortoiliac bifurcation is widely patent.  Aorta has mild calcific atherosclerotic changes.  Aorta is mildly tortuous.   Impression:  Suspect EKG abnormalities related to distal left PDA occlusion.   Right coronary artery stenosis is inconsequential and essentially supplies the right ventricle and should not cause any significant issues.  We will start him on Plavix in view of ACS with what appears to be an ulcerated occlusion of the left PDA.   Right heart catheterization reveals no significant pulmonary hypertension,   Qp/Qs.   Continue aggressive risk modification, will order abdominal aortic duplex  to exclude aneurysm.  110 mL contrast utilized.  LABORATORY DATA:    Latest Ref Rng & Units 10/23/2022    4:39 AM 10/21/2022    1:09 AM 10/20/2022    8:35 AM  CBC  WBC 4.0 - 10.5 K/uL 12.6  11.2    Hemoglobin 13.0 - 17.0 g/dL 40.9  81.1  91.4   Hematocrit 39.0 - 52.0 % 46.6  44.0  41.0   Platelets 150 - 400 K/uL 258  253         Latest Ref Rng & Units 10/23/2022    4:39 AM 10/22/2022   12:30 AM 10/21/2022    1:09 AM  CMP  Glucose 70 - 99 mg/dL 782  956  95   BUN 8 - 23 mg/dL 30  27  25    Creatinine 0.61 - 1.24 mg/dL 2.13  0.86  5.78   Sodium 135 - 145 mmol/L 136  135  138   Potassium 3.5 - 5.1 mmol/L 3.7  3.5  3.6   Chloride 98 - 111 mmol/L 103  103  105   CO2 22 - 32 mmol/L 19  21  21    Calcium 8.9 - 10.3 mg/dL 8.7  8.4  8.5     Lipid Panel     Component Value Date/Time   CHOL 145 10/18/2022 0445   TRIG 54 10/18/2022 0445   HDL 31 (L) 10/18/2022 0445   CHOLHDL 4.7 10/18/2022 0445   VLDL 11 10/18/2022 0445   LDLCALC 103 (H) 10/18/2022 0445   LDLDIRECT 93.6 08/04/2021 1900    No components found for: "NTPROBNP" No results for input(s): "PROBNP" in the last 8760 hours.  Recent Labs    10/18/22 0116  TSH 1.425    BMP Recent Labs    10/21/22 0109 10/22/22 0030 10/23/22 0439  NA 138 135 136  K 3.6 3.5 3.7  CL 105 103 103  CO2 21* 21* 19*  GLUCOSE 95 103* 127*  BUN 25* 27* 30*  CREATININE 1.30* 1.43* 1.32*  CALCIUM 8.5* 8.4* 8.7*  GFRNONAA >60 55* >60    HEMOGLOBIN A1C Lab Results  Component Value Date   HGBA1C 5.6 08/04/2021   MPG 114.02 08/04/2021    IMPRESSION:    ICD-10-CM   1. Chronic HFrEF (heart failure with reduced ejection fraction) (HCC)  I50.22 EKG 12-Lead    isosorbide mononitrate (IMDUR) 60 MG 24 hr tablet    spironolactone (ALDACTONE) 25 MG tablet    metoprolol succinate (TOPROL-XL) 50 MG 24 hr tablet    2. Persistent atrial fibrillation (HCC)  I48.19 EKG 12-Lead    amiodarone (PACERONE) 200 MG tablet    3. Long term (current)  use of anticoagulants  Z79.01     4. Coronary artery disease involving native coronary artery of native heart without angina pectoris  I25.10 ranolazine (  RANEXA) 500 MG 12 hr tablet    5. Benign hypertension  I10     6. Infrarenal abdominal aortic aneurysm (AAA) without rupture (HCC)  I71.43     7. Atherosclerosis of aorta (HCC)  I70.0     8. Tobacco dependence  F17.200     9. Alcohol use  Z78.9         RECOMMENDATIONS: DAVED BAUERNFEIND is a 62 y.o. Caucasian male whose past medical history and cardiac risk factors include: Chronic HFrEF, persistent atrial fibrillation, NSTEMI (May 2023), hypertension, smoker (started at the age of 7 averaging 2 to 3 packs/day until the age of 45 now down to 0.5 packs/day), excessive alcohol consumption, marijuana use, coronary and aortic atherosclerosis (nongated CT study 12/2020), emphysema, infrarenal aortic aneurysm.  Chronic HFrEF (heart failure with reduced ejection fraction) (HCC) Stage C, NYHA class II/III Last hospitalization for heart failure July 2024, discharge weight 110 pounds. During her his recent PCP visit Michael Bell was held secondary to soft blood pressures and also it being cost prohibitive. Agree with continuing diuretics to maintain volume management. He also does not have repeat labs to confirm that his renal function and electrolytes are stable.  He plans to have them done next week.  Have asked him to send [place] a copy for reference when done. Medications reconciled and refilled as noted above. Reemphasized the importance of improving his modifiable cardiovascular risk factors. Samples for [place] provided. Patient assistance form for Marcelline Deist provided to the patient and wife.  Persistent atrial fibrillation (HCC) Rate control: Metoprolol. Rhythm control: Amiodarone. Thromboembolic prophylaxis: Eliquis Click Here to Calculate/Change CHADS2VASc Score The patient's CHADS2-VASc score is 3, indicating a 3.2% annual risk of stroke.   Therefore, anticoagulation is recommended.   CHF History: Yes HTN History: Yes Diabetes History: No Stroke History: No Vascular Disease History: Yes Would like to hold off on direct-current cardioversion at this time as a long-term affordability for anticoagulation is of question due to cost and lack of insurance.  In addition, his rate is well-controlled and clinically appears to be still compensated.  I also suspect that cardioversion may be of low yield but could be considered (biatrial enlargements severely dilated on last study)  Long term (current) use of anticoagulants Does not endorse evidence of bleeding. Risks, benefits, alternatives to anticoagulation discussed. Samples for Eliquis provided. Patient's assistance form for Eliquis provided as well  Coronary artery disease involving native coronary artery of native heart without angina pectoris He has precordial discomfort predominately noncardiac based on symptoms.   Most recent heart catheterization results reviewed Will restart Ranexa 500 mg p.o. twice daily. Reemphasized the importance of secondary prevention with focus on improving her modifiable cardiovascular risk factors such as glycemic control, lipid management, blood pressure control, weight loss.  Benign hypertension Office blood pressures initially elevated, on repeat check had improved but not at goal. Will continue current medical therapy for now until the labs are known.  Infrarenal abdominal aortic aneurysm (AAA) without rupture (HCC) Atherosclerosis of aorta (HCC) Tobacco dependence Currently on statin and aspirin therapy. Reemphasized importance of complete cessation of alcohol and smoking. Last study was performed in May 2023 and recommended 2-year follow-up.  During today's visit discussed management of heart failure, plan of care given the new diagnosis of persistent atrial fibrillation, reviewed diagnostic imaging results from his recent hospitalization,  discharge summary reviewed, medications refilled, patient assistance forms provided, samples for Eliquis and Farxiga given, and coordination of care.  Both patient and his wife's questions and  concerns were addressed to their satisfaction.  FINAL MEDICATION LIST END OF ENCOUNTER: Meds ordered this encounter  Medications   isosorbide mononitrate (IMDUR) 60 MG 24 hr tablet    Sig: Take 1 tablet (60 mg total) by mouth daily    Dispense:  90 tablet    Refill:  0   spironolactone (ALDACTONE) 25 MG tablet    Sig: Take 1/2 tablet (12.5 mg total) by mouth daily.    Dispense:  30 tablet    Refill:  0   metoprolol succinate (TOPROL-XL) 50 MG 24 hr tablet    Sig: Take 1 tablet (50 mg total) by mouth daily. Take with or immediately following a meal.    Dispense:  90 tablet    Refill:  0   amiodarone (PACERONE) 200 MG tablet    Sig: Take 1 tablet (200 mg total) by mouth daily.    Dispense:  90 tablet    Refill:  0   ranolazine (RANEXA) 500 MG 12 hr tablet    Sig: Take 1 tablet (500 mg total) by mouth 2 (two) times daily.    Dispense:  180 tablet    Refill:  0    Medications Discontinued During This Encounter  Medication Reason   sacubitril-valsartan (ENTRESTO) 49-51 MG Discontinued by provider   ranolazine (RANEXA) 500 MG 12 hr tablet    amiodarone (PACERONE) 200 MG tablet Reorder   isosorbide mononitrate (IMDUR) 60 MG 24 hr tablet Reorder   metoprolol succinate (TOPROL-XL) 50 MG 24 hr tablet Reorder   spironolactone (ALDACTONE) 25 MG tablet Reorder      Current Outpatient Medications:    albuterol (VENTOLIN HFA) 108 (90 Base) MCG/ACT inhaler, Inhale 1-2 puffs into the lungs every 4 (four) hours as needed for wheezing or shortness of breath., Disp: 6.7 g, Rfl: 0   apixaban (ELIQUIS) 5 MG TABS tablet, Take 1 tablet (5 mg total) by mouth 2 (two) times daily., Disp: 60 tablet, Rfl: 0   aspirin EC 81 MG tablet, Take 1 tablet (81 mg total) by mouth daily. Swallow whole., Disp: 120 tablet, Rfl:  0   atorvastatin (LIPITOR) 80 MG tablet, Take 1 tablet (80 mg total) by mouth daily., Disp: 30 tablet, Rfl: 0   dapagliflozin propanediol (FARXIGA) 10 MG TABS tablet, Take 1 tablet (10 mg total) by mouth daily., Disp: 30 tablet, Rfl: 0   fluticasone (FLONASE) 50 MCG/ACT nasal spray, Place 2 sprays into both nostrils daily., Disp: 16 g, Rfl: 2   furosemide (LASIX) 40 MG tablet, Take 1 tablet (40 mg total) by mouth daily., Disp: 30 tablet, Rfl: 0   ranolazine (RANEXA) 500 MG 12 hr tablet, Take 1 tablet (500 mg total) by mouth 2 (two) times daily., Disp: 180 tablet, Rfl: 0   umeclidinium-vilanterol (ANORO ELLIPTA) 62.5-25 MCG/ACT AEPB, Inhale 1 puff into the lungs daily., Disp: 60 each, Rfl: 1   amiodarone (PACERONE) 200 MG tablet, Take 1 tablet (200 mg total) by mouth daily., Disp: 90 tablet, Rfl: 0   isosorbide mononitrate (IMDUR) 60 MG 24 hr tablet, Take 1 tablet (60 mg total) by mouth daily, Disp: 90 tablet, Rfl: 0   metoprolol succinate (TOPROL-XL) 50 MG 24 hr tablet, Take 1 tablet (50 mg total) by mouth daily. Take with or immediately following a meal., Disp: 90 tablet, Rfl: 0   spironolactone (ALDACTONE) 25 MG tablet, Take 1/2 tablet (12.5 mg total) by mouth daily., Disp: 30 tablet, Rfl: 0  Orders Placed This Encounter  Procedures   EKG 12-Lead  There are no Patient Instructions on file for this visit.   --Continue cardiac medications as reconciled in final medication list. --Return in about 3 months (around 02/11/2023) for Follow up, heart failure management., A. fib. or sooner if needed. --Continue follow-up with your primary care physician regarding the management of your other chronic comorbid conditions.  Patient's questions and concerns were addressed to his satisfaction. He voices understanding of the instructions provided during this encounter.   This note was created using a voice recognition software as a result there may be grammatical errors inadvertently enclosed that do  not reflect the nature of this encounter. Every attempt is made to correct such errors.  Tessa Lerner, [place], [facility]  Pager:  [phone] Office: [phone]

## 2022-11-14 ENCOUNTER — Other Ambulatory Visit (HOSPITAL_COMMUNITY): Payer: Self-pay

## 2022-11-17 ENCOUNTER — Encounter: Payer: Self-pay | Admitting: Emergency Medicine

## 2022-11-17 ENCOUNTER — Other Ambulatory Visit (HOSPITAL_COMMUNITY): Payer: Self-pay

## 2022-11-17 ENCOUNTER — Ambulatory Visit: Payer: Medicaid Other | Admitting: Emergency Medicine

## 2022-11-17 VITALS — BP 134/80 | HR 57 | Ht 74.0 in | Wt 255.8 lb

## 2022-11-17 DIAGNOSIS — J439 Emphysema, unspecified: Secondary | ICD-10-CM | POA: Diagnosis not present

## 2022-11-17 DIAGNOSIS — R590 Localized enlarged lymph nodes: Secondary | ICD-10-CM | POA: Diagnosis not present

## 2022-11-17 DIAGNOSIS — Z720 Tobacco use: Secondary | ICD-10-CM

## 2022-11-17 MED ORDER — ALBUTEROL SULFATE (2.5 MG/3ML) 0.083% IN NEBU
2.5000 mg | INHALATION_SOLUTION | Freq: Four times a day (QID) | RESPIRATORY_TRACT | 12 refills | Status: DC | PRN
  Filled 2022-11-17 – 2023-03-04 (×2): qty 75, 7d supply, fill #0
  Filled 2023-06-19: qty 75, 7d supply, fill #1

## 2022-11-17 NOTE — Assessment & Plan Note (Signed)
Of unclear cause.  His most recent CT chest was done when he was volume overloaded, question whether enlarged nodes were in part related.  I think the best strategy is to get a PET scan, better characterize the size and any hypermetabolism in the nodes.  If suspicion remains high then we will arrange for EBUS to sample his nodes.  Follow-up after the PET scan

## 2022-11-17 NOTE — Progress Notes (Signed)
Subjective:    Patient ID: Michael Bell, male    DOB: 1960-09-22, 62 y.o.   MRN: [id number]  HPI 62 year old man with a history of tobacco use (100 pack years), CAD/STEMI, hypertension and atrial fibrillation with HFpEF.  He was recently admitted in late July 2024 with a CHF exacerbation in the setting of atrial fibrillation with RVR.  He required diuresis.  He is on metoprolol, amiodarone, Eliquis, Entresto, spironolactone. Lasix added back 2 weeks.  He carries a diagnosis of COPD and was discharged on Anoro.  He has had snoring and we discussed possible PSG while he was in the hospital. Today he reports some SOB with exertion. He is using albuterol 3-4x a day with mixed response - doesn't always help. Some daily cough w white mucous.   CT-PA 10/18/2022 reviewed by me showed no evidence of PE, mediastinal lymph nodes including right paratracheal lymph node 3 cm (larger than 2022), some subcarinal adenopathy, left hilar and left AP window nodes.  He had centrilobular and paraseptal emphysema with small bilateral pleural effusions some associated atelectasis.   Review of Systems As per HPI  Past Medical History:  Diagnosis Date   Acute diastolic heart failure (HCC)    Arthritis    CHF (congestive heart failure) (HCC)    Headache    Hypertension    Hypertensive urgency      Family History  Problem Relation Age of Onset   Ovarian cancer Mother 38       primary peritoneal cancer   Cancer Mother    Diabetes Mother    Heart disease Mother    Hyperlipidemia Mother    Hypertension Mother    Stroke Mother    Mental illness Mother    Colon polyps Father        91 on last colonoscopy   Heart disease Father    Hyperlipidemia Father    Hypertension Father    Stroke Father    Cancer Sister    Colon cancer Sister 61       cancer of the sigmoid colon   Heart disease Brother    Hypertension Brother    Heart disease Brother    Hyperlipidemia Brother    Hypertension Brother    Down  syndrome Paternal Aunt    Cancer Paternal Uncle        cancer of the spine   COPD Maternal Grandmother    Stomach cancer Paternal Grandmother      Social History   Socioeconomic History   Marital status: Married    Spouse name: Not on file   Number of children: Not on file   Years of education: Not on file   Highest education level: Not on file  Occupational History   Not on file  Tobacco Use   Smoking status: Every Day    Current packs/day: 1.50    Average packs/day: 1.5 packs/day for 0.6 years (1.0 ttl pk-yrs)    Types: Cigarettes    Start date: 34    Last attempt to quit: 2023   Smokeless tobacco: Never  Vaping Use   Vaping status: Never Used  Substance and Sexual Activity   Alcohol use: Not Currently   Drug use: Not Currently    Types: Marijuana    Comment: 12/26/13   Sexual activity: Not on file  Other Topics Concern   Not on file  Social History Narrative   ** Merged History Encounter **       Social Determinants of  Health   Financial Resource Strain: Not on file  Food Insecurity: No Food Insecurity (10/18/2022)   Hunger Vital Sign    Worried About Running Out of Food in the Last Year: Never true    Ran Out of Food in the Last Year: Never true  Transportation Needs: No Transportation Needs (10/18/2022)   PRAPARE - Administrator, Civil Service (Medical): No    Lack of Transportation (Non-Medical): No  Physical Activity: Not on file  Stress: Not on file  Social Connections: Not on file  Intimate Partner Violence: Not At Risk (10/18/2022)   Humiliation, Afraid, Rape, and Kick questionnaire    Fear of Current or Ex-Partner: No    Emotionally Abused: No    Physically Abused: No    Sexually Abused: No     No Known Allergies   Outpatient Medications Prior to Visit  Medication Sig Dispense Refill   albuterol (VENTOLIN HFA) 108 (90 Base) MCG/ACT inhaler Inhale 1-2 puffs into the lungs every 4 (four) hours as needed for wheezing or shortness of  breath. 6.7 g 0   amiodarone (PACERONE) 200 MG tablet Take 1 tablet (200 mg total) by mouth daily. 90 tablet 0   apixaban (ELIQUIS) 5 MG TABS tablet Take 1 tablet (5 mg total) by mouth 2 (two) times daily. 60 tablet 0   aspirin EC 81 MG tablet Take 1 tablet (81 mg total) by mouth daily. Swallow whole. 120 tablet 0   atorvastatin (LIPITOR) 80 MG tablet Take 1 tablet (80 mg total) by mouth daily. 30 tablet 0   dapagliflozin propanediol (FARXIGA) 10 MG TABS tablet Take 1 tablet (10 mg total) by mouth daily. 30 tablet 0   fluticasone (FLONASE) 50 MCG/ACT nasal spray Place 2 sprays into both nostrils daily. 16 g 2   furosemide (LASIX) 40 MG tablet Take 1 tablet (40 mg total) by mouth daily. 30 tablet 0   isosorbide mononitrate (IMDUR) 60 MG 24 hr tablet Take 1 tablet (60 mg total) by mouth daily 90 tablet 0   metoprolol succinate (TOPROL-XL) 50 MG 24 hr tablet Take 1 tablet (50 mg total) by mouth daily. Take with or immediately following a meal. 90 tablet 0   ranolazine (RANEXA) 500 MG 12 hr tablet Take 1 tablet (500 mg total) by mouth 2 (two) times daily. 180 tablet 0   spironolactone (ALDACTONE) 25 MG tablet Take 1/2 tablet (12.5 mg total) by mouth daily. 30 tablet 0   umeclidinium-vilanterol (ANORO ELLIPTA) 62.5-25 MCG/ACT AEPB Inhale 1 puff into the lungs daily. 60 each 1   No facility-administered medications prior to visit.        Objective:   Physical Exam  Vitals:   11/17/22 1031  BP: 134/80  Pulse: (!) 57  SpO2: 93%  Weight: 255 lb 12.8 oz (116 kg)  Height: 6\' 2"  (1.88 m)    Gen: Pleasant, well-nourished, in no distress,  normal affect  ENT: No lesions,  mouth clear,  oropharynx clear, no postnasal drip  Neck: No JVD, no stridor  Lungs: No use of accessory muscles, distant, some scattered rhonchi.  No overt wheezing  Cardiovascular: RRR, heart sounds normal, no murmur or gallops, no peripheral edema  Musculoskeletal: No deformities, no cyanosis or clubbing  Neuro:  alert, awake, non focal  Skin: Warm, no lesions or rash     Assessment & Plan:  Tobacco abuse He has cut down, has a significant history.  Discussed goals for cutting down further today.  If he  can get down far enough then we can entertain a possible cessation and quit date.  He does have nicotine patches available  COPD (chronic obstructive pulmonary disease) (HCC) He is back on Anoro, is probably benefiting some.  He has multifactorial dyspnea.  Has albuterol available as well.  He is interested in nebulizer and we will get this for him today.  Consider repeat PFT at some point going forward.  Mediastinal adenopathy Of unclear cause.  His most recent CT chest was done when he was volume overloaded, question whether enlarged nodes were in part related.  I think the best strategy is to get a PET scan, better characterize the size and any hypermetabolism in the nodes.  If suspicion remains high then we will arrange for EBUS to sample his nodes.  Follow-up after the PET scan   Levy Pupa, MD, PhD 11/17/2022, 10:49 AM [facility] [phone] or if no answer before 7:00PM call [phone] For any issues after 7:00PM please call eLink [phone]

## 2022-11-17 NOTE — Patient Instructions (Addendum)
We reviewed your CT scan of the chest from your recent hospitalization. We will arrange for a PET scan to further evaluate enlarged lymph nodes in the chest Please continue Anoro once daily. Keep albuterol available to use 2 puffs if needed for shortness of breath, chest tightness, wheezing. We will give your albuterol nebulizer that you can also use up to every 4 hours if needed for shortness of breath Congratulations on decreasing your smoking.  We will need to continue to work to decrease further Continue cardiac medications as ordered Follow-up with Dr. Delton Coombes next available after your PET scan so we can review the results and decide if other testing

## 2022-11-17 NOTE — Assessment & Plan Note (Signed)
He has cut down, has a significant history.  Discussed goals for cutting down further today.  If he can get down far enough then we can entertain a possible cessation and quit date.  He does have nicotine patches available

## 2022-11-17 NOTE — Assessment & Plan Note (Signed)
He is back on Anoro, is probably benefiting some.  He has multifactorial dyspnea.  Has albuterol available as well.  He is interested in nebulizer and we will get this for him today.  Consider repeat PFT at some point going forward.

## 2022-11-21 ENCOUNTER — Other Ambulatory Visit (HOSPITAL_COMMUNITY): Payer: Self-pay

## 2022-11-21 ENCOUNTER — Other Ambulatory Visit: Payer: Self-pay | Admitting: Internal Medicine

## 2022-11-23 ENCOUNTER — Other Ambulatory Visit (HOSPITAL_COMMUNITY): Payer: Self-pay

## 2022-11-24 ENCOUNTER — Other Ambulatory Visit: Payer: Self-pay | Admitting: *Deleted

## 2022-11-24 ENCOUNTER — Other Ambulatory Visit (INDEPENDENT_AMBULATORY_CARE_PROVIDER_SITE_OTHER): Payer: Medicaid Other

## 2022-11-24 ENCOUNTER — Telehealth: Payer: Self-pay | Admitting: *Deleted

## 2022-11-24 ENCOUNTER — Other Ambulatory Visit: Payer: Medicaid Other

## 2022-11-24 DIAGNOSIS — I5020 Unspecified systolic (congestive) heart failure: Secondary | ICD-10-CM

## 2022-11-24 LAB — BASIC METABOLIC PANEL
Anion gap: 10 (ref 5–15)
BUN: 23 mg/dL (ref 8–23)
CO2: 24 mmol/L (ref 22–32)
Calcium: 8.8 mg/dL — ABNORMAL LOW (ref 8.9–10.3)
Chloride: 104 mmol/L (ref 98–111)
Creatinine, Ser: 1.44 mg/dL — ABNORMAL HIGH (ref 0.61–1.24)
GFR, Estimated: 55 mL/min — ABNORMAL LOW (ref >60)
Glucose, Bld: 111 mg/dL — ABNORMAL HIGH (ref 70–99)
Potassium: 4 mmol/L (ref 3.5–5.1)
Sodium: 138 mmol/L (ref 135–145)

## 2022-11-24 MED ORDER — DAPAGLIFLOZIN PROPANEDIOL 10 MG PO TABS
10.0000 mg | ORAL_TABLET | Freq: Every day | ORAL | 0 refills | Status: DC
  Filled 2022-11-24: qty 30, 30d supply, fill #0

## 2022-11-24 MED ORDER — ALBUTEROL SULFATE HFA 108 (90 BASE) MCG/ACT IN AERS
1.0000 | INHALATION_SPRAY | RESPIRATORY_TRACT | 0 refills | Status: DC | PRN
  Filled 2022-11-24: qty 18, 17d supply, fill #0

## 2022-11-24 MED ORDER — APIXABAN 5 MG PO TABS
5.0000 mg | ORAL_TABLET | Freq: Two times a day (BID) | ORAL | 0 refills | Status: DC
  Filled 2022-11-24: qty 60, 30d supply, fill #0

## 2022-11-24 MED ORDER — ATORVASTATIN CALCIUM 80 MG PO TABS
80.0000 mg | ORAL_TABLET | Freq: Every day | ORAL | 0 refills | Status: DC
  Filled 2022-11-24: qty 30, 30d supply, fill #0

## 2022-11-24 NOTE — Telephone Encounter (Signed)
Patient came in for lab only visit today.  Asked if labs can be sent to [facility] -Dr. Delton Coombes.  Patient then asked for B/P check was told ok just check in for.  Stated did not have time for as he had an appointment at 10:00 am. Patient then stated that's ok and then left.  French Ana changed the order for the labs done today to get a quicker return of the results.

## 2022-11-25 ENCOUNTER — Other Ambulatory Visit (HOSPITAL_COMMUNITY): Payer: Self-pay

## 2022-11-25 DIAGNOSIS — J438 Other emphysema: Secondary | ICD-10-CM | POA: Diagnosis not present

## 2022-11-29 ENCOUNTER — Other Ambulatory Visit (HOSPITAL_COMMUNITY): Payer: Self-pay

## 2022-12-01 ENCOUNTER — Telehealth: Payer: Self-pay | Admitting: Emergency Medicine

## 2022-12-01 ENCOUNTER — Other Ambulatory Visit: Payer: Self-pay

## 2022-12-01 MED ORDER — APIXABAN 5 MG PO TABS: 5.0000 mg | ORAL_TABLET | Freq: Two times a day (BID) | ORAL | 3 refills | Status: DC

## 2022-12-01 NOTE — Telephone Encounter (Signed)
Pt calling about his Pet scan being denied by his ins,

## 2022-12-01 NOTE — Telephone Encounter (Signed)
Ins calling to inform us of Denial,

## 2022-12-02 ENCOUNTER — Encounter (HOSPITAL_COMMUNITY): Payer: Medicaid Other

## 2022-12-12 ENCOUNTER — Encounter (HOSPITAL_COMMUNITY): Payer: Medicaid Other

## 2022-12-12 ENCOUNTER — Telehealth: Payer: Self-pay | Admitting: Emergency Medicine

## 2022-12-12 NOTE — Telephone Encounter (Signed)
I let PT know thru [facility]. NFN

## 2022-12-12 NOTE — Telephone Encounter (Signed)
Writes to [place] on [facility] he wants to resched the PET. Please reach out for PT.

## 2022-12-12 NOTE — Telephone Encounter (Signed)
Please see previous message this was denied through insurance and patient was notified a message was placed on 12/01/22 about this Iris Pert has sent the provider a message

## 2022-12-19 ENCOUNTER — Other Ambulatory Visit: Payer: Self-pay | Admitting: Student

## 2022-12-19 ENCOUNTER — Telehealth: Payer: Self-pay | Admitting: Cardiology

## 2022-12-19 ENCOUNTER — Other Ambulatory Visit: Payer: Self-pay

## 2022-12-19 ENCOUNTER — Other Ambulatory Visit (HOSPITAL_COMMUNITY): Payer: Self-pay

## 2022-12-19 NOTE — Telephone Encounter (Signed)
Michael Bell is calling to report the date as well as the patient being in or outpatient is missing from the Eliquis application.   Due to this it is being faxed back to [phone].   She request our fax number be updated on the application as well due to it no longer going back to [place].  Please advise.

## 2022-12-19 NOTE — Telephone Encounter (Signed)
**Note De-Identified Michael Bell Obfuscation** I was not involved with the pts BMSPAF application so I do not have his original application. Per his chart the pt has not been seen at [facility].

## 2022-12-20 ENCOUNTER — Other Ambulatory Visit: Payer: Self-pay

## 2022-12-20 ENCOUNTER — Other Ambulatory Visit (HOSPITAL_COMMUNITY): Payer: Self-pay

## 2022-12-20 ENCOUNTER — Telehealth: Payer: Self-pay

## 2022-12-20 MED ORDER — DAPAGLIFLOZIN PROPANEDIOL 10 MG PO TABS
10.0000 mg | ORAL_TABLET | Freq: Every day | ORAL | 0 refills | Status: DC
Start: 2022-12-20 — End: 2023-02-07
  Filled 2022-12-20 – 2022-12-21 (×2): qty 30, 30d supply, fill #0

## 2022-12-20 MED ORDER — ALBUTEROL SULFATE HFA 108 (90 BASE) MCG/ACT IN AERS
1.0000 | INHALATION_SPRAY | RESPIRATORY_TRACT | 0 refills | Status: DC | PRN
  Filled 2022-12-20: qty 18, 17d supply, fill #0

## 2022-12-20 MED ORDER — ATORVASTATIN CALCIUM 80 MG PO TABS
80.0000 mg | ORAL_TABLET | Freq: Every day | ORAL | 0 refills | Status: DC
Start: 2022-12-20 — End: 2023-02-07
  Filled 2022-12-20: qty 30, 30d supply, fill #0

## 2022-12-20 MED FILL — Furosemide Tab 40 MG: ORAL | 30 days supply | Qty: 30 | Fill #0 | Status: AC

## 2022-12-20 NOTE — Telephone Encounter (Signed)
Prior Authorization for patient Michael Bell) came through on cover my meds was submitted with last office notes awaiting approval or denial.  NWG:[id number]

## 2022-12-20 NOTE — Telephone Encounter (Signed)
Decision:Approved Tenna Delaine (Key: [id number]) PA Case ID #: [id number] Rx #: [id number] Need Help? Call us at [phone] Outcome Approved today by [facility] 2017 NCPDP Request Reference Number: [id number]. FARXIGA TAB 10MG  is approved through 12/20/2023. For further questions, call [employer] at [phone]. Authorization Expiration Date: 12/20/2023 Drug Farxiga 10MG  tablets ePA cloud logo Form [employer] Electronic Prior Authorization Form [phone] NCPDP) Original Claim Info 60

## 2022-12-21 ENCOUNTER — Other Ambulatory Visit (HOSPITAL_COMMUNITY): Payer: Self-pay

## 2022-12-21 NOTE — Telephone Encounter (Signed)
Pa came through again copy was made of approval and sent to pharmacy

## 2022-12-26 DIAGNOSIS — J438 Other emphysema: Secondary | ICD-10-CM | POA: Diagnosis not present

## 2023-01-02 ENCOUNTER — Other Ambulatory Visit (HOSPITAL_COMMUNITY): Payer: Self-pay

## 2023-01-25 DIAGNOSIS — J438 Other emphysema: Secondary | ICD-10-CM | POA: Diagnosis not present

## 2023-02-04 ENCOUNTER — Inpatient Hospital Stay (HOSPITAL_COMMUNITY)
Admission: EM | Admit: 2023-02-04 | Discharge: 2023-02-07 | DRG: 309 | Disposition: A | Discharge status: Home / Self Care | Payer: Medicaid Other | Attending: Cardiology | Admitting: Cardiology

## 2023-02-04 ENCOUNTER — Inpatient Hospital Stay (HOSPITAL_COMMUNITY)
Admission: EM | Disposition: A | Discharge status: Home / Self Care | Payer: Self-pay | Source: Home / Self Care | Attending: Cardiology

## 2023-02-04 ENCOUNTER — Encounter (HOSPITAL_COMMUNITY): Payer: Self-pay

## 2023-02-04 ENCOUNTER — Emergency Department (HOSPITAL_COMMUNITY): Payer: Medicaid Other

## 2023-02-04 ENCOUNTER — Inpatient Hospital Stay (HOSPITAL_COMMUNITY): Payer: Medicaid Other

## 2023-02-04 ENCOUNTER — Other Ambulatory Visit: Payer: Self-pay

## 2023-02-04 DIAGNOSIS — I255 Ischemic cardiomyopathy: Secondary | ICD-10-CM | POA: Diagnosis not present

## 2023-02-04 DIAGNOSIS — Z8279 Family history of other congenital malformations, deformations and chromosomal abnormalities: Secondary | ICD-10-CM

## 2023-02-04 DIAGNOSIS — N179 Acute kidney failure, unspecified: Secondary | ICD-10-CM | POA: Diagnosis not present

## 2023-02-04 DIAGNOSIS — I5021 Acute systolic (congestive) heart failure: Secondary | ICD-10-CM | POA: Diagnosis present

## 2023-02-04 DIAGNOSIS — I462 Cardiac arrest due to underlying cardiac condition: Secondary | ICD-10-CM | POA: Diagnosis present

## 2023-02-04 DIAGNOSIS — I469 Cardiac arrest, cause unspecified: Secondary | ICD-10-CM

## 2023-02-04 DIAGNOSIS — I4901 Ventricular fibrillation: Secondary | ICD-10-CM | POA: Diagnosis not present

## 2023-02-04 DIAGNOSIS — Z825 Family history of asthma and other chronic lower respiratory diseases: Secondary | ICD-10-CM

## 2023-02-04 DIAGNOSIS — I110 Hypertensive heart disease with heart failure: Secondary | ICD-10-CM | POA: Diagnosis present

## 2023-02-04 DIAGNOSIS — I5020 Unspecified systolic (congestive) heart failure: Secondary | ICD-10-CM | POA: Diagnosis present

## 2023-02-04 DIAGNOSIS — I2510 Atherosclerotic heart disease of native coronary artery without angina pectoris: Secondary | ICD-10-CM | POA: Diagnosis not present

## 2023-02-04 DIAGNOSIS — Z823 Family history of stroke: Secondary | ICD-10-CM

## 2023-02-04 DIAGNOSIS — I499 Cardiac arrhythmia, unspecified: Secondary | ICD-10-CM | POA: Diagnosis not present

## 2023-02-04 DIAGNOSIS — I252 Old myocardial infarction: Secondary | ICD-10-CM

## 2023-02-04 DIAGNOSIS — Z79899 Other long term (current) drug therapy: Secondary | ICD-10-CM

## 2023-02-04 DIAGNOSIS — I10 Essential (primary) hypertension: Secondary | ICD-10-CM | POA: Diagnosis present

## 2023-02-04 DIAGNOSIS — R404 Transient alteration of awareness: Secondary | ICD-10-CM | POA: Diagnosis not present

## 2023-02-04 DIAGNOSIS — R0989 Other specified symptoms and signs involving the circulatory and respiratory systems: Secondary | ICD-10-CM | POA: Diagnosis not present

## 2023-02-04 DIAGNOSIS — J449 Chronic obstructive pulmonary disease, unspecified: Secondary | ICD-10-CM | POA: Diagnosis present

## 2023-02-04 DIAGNOSIS — M19011 Primary osteoarthritis, right shoulder: Secondary | ICD-10-CM | POA: Diagnosis not present

## 2023-02-04 DIAGNOSIS — I517 Cardiomegaly: Secondary | ICD-10-CM | POA: Diagnosis not present

## 2023-02-04 DIAGNOSIS — I480 Paroxysmal atrial fibrillation: Secondary | ICD-10-CM | POA: Diagnosis present

## 2023-02-04 DIAGNOSIS — Z95810 Presence of automatic (implantable) cardiac defibrillator: Secondary | ICD-10-CM | POA: Diagnosis not present

## 2023-02-04 DIAGNOSIS — R0681 Apnea, not elsewhere classified: Secondary | ICD-10-CM | POA: Diagnosis not present

## 2023-02-04 DIAGNOSIS — I493 Ventricular premature depolarization: Secondary | ICD-10-CM | POA: Diagnosis present

## 2023-02-04 DIAGNOSIS — I428 Other cardiomyopathies: Secondary | ICD-10-CM | POA: Diagnosis present

## 2023-02-04 DIAGNOSIS — I5023 Acute on chronic systolic (congestive) heart failure: Secondary | ICD-10-CM | POA: Diagnosis not present

## 2023-02-04 DIAGNOSIS — I4729 Other ventricular tachycardia: Secondary | ICD-10-CM | POA: Diagnosis present

## 2023-02-04 DIAGNOSIS — N170 Acute kidney failure with tubular necrosis: Secondary | ICD-10-CM | POA: Diagnosis not present

## 2023-02-04 DIAGNOSIS — R079 Chest pain, unspecified: Secondary | ICD-10-CM | POA: Diagnosis not present

## 2023-02-04 DIAGNOSIS — M25511 Pain in right shoulder: Secondary | ICD-10-CM | POA: Diagnosis not present

## 2023-02-04 DIAGNOSIS — Z833 Family history of diabetes mellitus: Secondary | ICD-10-CM | POA: Diagnosis not present

## 2023-02-04 DIAGNOSIS — F17210 Nicotine dependence, cigarettes, uncomplicated: Secondary | ICD-10-CM | POA: Diagnosis present

## 2023-02-04 DIAGNOSIS — Z7901 Long term (current) use of anticoagulants: Secondary | ICD-10-CM

## 2023-02-04 DIAGNOSIS — Z8249 Family history of ischemic heart disease and other diseases of the circulatory system: Secondary | ICD-10-CM | POA: Diagnosis not present

## 2023-02-04 DIAGNOSIS — I5022 Chronic systolic (congestive) heart failure: Secondary | ICD-10-CM

## 2023-02-04 DIAGNOSIS — I719 Aortic aneurysm of unspecified site, without rupture: Secondary | ICD-10-CM | POA: Diagnosis present

## 2023-02-04 DIAGNOSIS — Z7982 Long term (current) use of aspirin: Secondary | ICD-10-CM | POA: Diagnosis not present

## 2023-02-04 HISTORY — PX: CORONARY/GRAFT ACUTE MI REVASCULARIZATION: CATH118305

## 2023-02-04 HISTORY — PX: LEFT HEART CATH AND CORONARY ANGIOGRAPHY: CATH118249

## 2023-02-04 LAB — POCT ACTIVATED CLOTTING TIME: Activated Clotting Time: 170 s

## 2023-02-04 LAB — ECHOCARDIOGRAM COMPLETE
AR max vel: 3.11 cm2
AV Area VTI: 3.22 cm2
AV Area mean vel: 2.94 cm2
AV Mean grad: 3 mm[Hg]
AV Peak grad: 4.7 mm[Hg]
Ao pk vel: 1.08 m/s
Area-P 1/2: 3.3 cm2
Est EF: <20
Height: 74 in
S' Lateral: 5.3 cm
Weight: 4400 [oz_av]

## 2023-02-04 LAB — TROPONIN I (HIGH SENSITIVITY)
Troponin I (High Sensitivity): 30 ng/L — ABNORMAL HIGH (ref <18)
Troponin I (High Sensitivity): 263 ng/L (ref <18)

## 2023-02-04 LAB — CBC
HCT: 43.5 % (ref 39.0–52.0)
Hemoglobin: 13.7 g/dL (ref 13.0–17.0)
MCH: 27 pg (ref 26.0–34.0)
MCHC: 31.5 g/dL (ref 30.0–36.0)
MCV: 85.6 fL (ref 80.0–100.0)
Platelets: 191 10*3/uL (ref 150–400)
RBC: 5.08 MIL/uL (ref 4.22–5.81)
RDW: 15.8 % — ABNORMAL HIGH (ref 11.5–15.5)
WBC: 14.1 10*3/uL — ABNORMAL HIGH (ref 4.0–10.5)
nRBC: 0 % (ref 0.0–0.2)

## 2023-02-04 LAB — HEPARIN LEVEL (UNFRACTIONATED): Heparin Unfractionated: 0.25 [IU]/mL — ABNORMAL LOW (ref 0.30–0.70)

## 2023-02-04 LAB — CBC WITH DIFFERENTIAL/PLATELET
Abs Immature Granulocytes: 0.28 10*3/uL — ABNORMAL HIGH (ref 0.00–0.07)
Basophils Absolute: 0.1 10*3/uL (ref 0.0–0.1)
Basophils Relative: 1 %
Eosinophils Absolute: 0.2 10*3/uL (ref 0.0–0.5)
Eosinophils Relative: 1 %
HCT: 41.8 % (ref 39.0–52.0)
Hemoglobin: 12.8 g/dL — ABNORMAL LOW (ref 13.0–17.0)
Immature Granulocytes: 2 %
Lymphocytes Relative: 44 %
Lymphs Abs: 6.7 10*3/uL — ABNORMAL HIGH (ref 0.7–4.0)
MCH: 27.2 pg (ref 26.0–34.0)
MCHC: 30.6 g/dL (ref 30.0–36.0)
MCV: 88.9 fL (ref 80.0–100.0)
Monocytes Absolute: 0.9 10*3/uL (ref 0.1–1.0)
Monocytes Relative: 6 %
Neutro Abs: 7.2 10*3/uL (ref 1.7–7.7)
Neutrophils Relative %: 46 %
Platelets: 202 10*3/uL (ref 150–400)
RBC: 4.7 MIL/uL (ref 4.22–5.81)
RDW: 15.6 % — ABNORMAL HIGH (ref 11.5–15.5)
WBC: 15.3 10*3/uL — ABNORMAL HIGH (ref 4.0–10.5)
nRBC: 0 % (ref 0.0–0.2)

## 2023-02-04 LAB — COMPREHENSIVE METABOLIC PANEL
ALT: 44 U/L (ref 0–44)
AST: 43 U/L — ABNORMAL HIGH (ref 15–41)
Albumin: 3.2 g/dL — ABNORMAL LOW (ref 3.5–5.0)
Alkaline Phosphatase: 72 U/L (ref 38–126)
Anion gap: 13 (ref 5–15)
BUN: 20 mg/dL (ref 8–23)
CO2: 16 mmol/L — ABNORMAL LOW (ref 22–32)
Calcium: 8.3 mg/dL — ABNORMAL LOW (ref 8.9–10.3)
Chloride: 106 mmol/L (ref 98–111)
Creatinine, Ser: 1.63 mg/dL — ABNORMAL HIGH (ref 0.61–1.24)
GFR, Estimated: 47 mL/min — ABNORMAL LOW (ref >60)
Glucose, Bld: 281 mg/dL — ABNORMAL HIGH (ref 70–99)
Potassium: 3.4 mmol/L — ABNORMAL LOW (ref 3.5–5.1)
Sodium: 135 mmol/L (ref 135–145)
Total Bilirubin: 0.9 mg/dL (ref <1.2)
Total Protein: 6.3 g/dL — ABNORMAL LOW (ref 6.5–8.1)

## 2023-02-04 LAB — CREATININE, SERUM
Creatinine, Ser: 1.68 mg/dL — ABNORMAL HIGH (ref 0.61–1.24)
GFR, Estimated: 46 mL/min — ABNORMAL LOW (ref >60)

## 2023-02-04 LAB — APTT: aPTT: 49 s — ABNORMAL HIGH (ref 24–36)

## 2023-02-04 LAB — MRSA NEXT GEN BY PCR, NASAL: MRSA by PCR Next Gen: NOT DETECTED

## 2023-02-04 SURGERY — CORONARY/GRAFT ACUTE MI REVASCULARIZATION
Anesthesia: LOCAL

## 2023-02-04 MED ORDER — ASPIRIN 81 MG PO CHEW: 324.0000 mg | CHEWABLE_TABLET | Freq: Once | ORAL | Status: DC

## 2023-02-04 MED ORDER — HEPARIN (PORCINE) IN NACL 1000-0.9 UT/500ML-% IV SOLN
INTRAVENOUS | Status: DC | PRN
  Administered 2023-02-04 (×2): 500 mL

## 2023-02-04 MED ORDER — HYDROCODONE-ACETAMINOPHEN 5-325 MG PO TABS
1.0000 | ORAL_TABLET | ORAL | Status: DC | PRN
  Administered 2023-02-04: 1 via ORAL
  Administered 2023-02-04 – 2023-02-07 (×8): 2 via ORAL
  Filled 2023-02-04: qty 1
  Filled 2023-02-04 (×8): qty 2

## 2023-02-04 MED ORDER — POTASSIUM CHLORIDE CRYS ER 20 MEQ PO TBCR
40.0000 meq | EXTENDED_RELEASE_TABLET | Freq: Once | ORAL | Status: AC
  Administered 2023-02-04: 40 meq via ORAL
  Filled 2023-02-04: qty 2

## 2023-02-04 MED ORDER — PERFLUTREN LIPID MICROSPHERE
1.0000 mL | INTRAVENOUS | Status: AC | PRN
  Administered 2023-02-04: 2 mL via INTRAVENOUS

## 2023-02-04 MED ORDER — ACETAMINOPHEN 325 MG PO TABS
650.0000 mg | ORAL_TABLET | ORAL | Status: DC | PRN
  Administered 2023-02-04 (×2): 650 mg via ORAL
  Filled 2023-02-04 (×2): qty 2

## 2023-02-04 MED ORDER — ASPIRIN 81 MG PO CHEW
CHEWABLE_TABLET | ORAL | Status: DC | PRN
  Administered 2023-02-04: 324 mg via ORAL

## 2023-02-04 MED ORDER — METOPROLOL SUCCINATE ER 50 MG PO TB24
50.0000 mg | ORAL_TABLET | Freq: Every day | ORAL | Status: DC
  Administered 2023-02-04 – 2023-02-06 (×3): 50 mg via ORAL
  Filled 2023-02-04 (×3): qty 1

## 2023-02-04 MED ORDER — FLUTICASONE PROPIONATE 50 MCG/ACT NA SUSP
2.0000 | Freq: Every day | NASAL | Status: DC
  Administered 2023-02-04 – 2023-02-06 (×3): 2 via NASAL
  Filled 2023-02-04: qty 16

## 2023-02-04 MED ORDER — MIDAZOLAM HCL 2 MG/2ML IJ SOLN
INTRAMUSCULAR | Status: AC
Start: 2023-02-04 — End: ?
  Filled 2023-02-04: qty 2

## 2023-02-04 MED ORDER — CHLORHEXIDINE GLUCONATE CLOTH 2 % EX PADS
6.0000 | MEDICATED_PAD | Freq: Every day | CUTANEOUS | Status: DC
  Administered 2023-02-04 – 2023-02-06 (×3): 6 via TOPICAL

## 2023-02-04 MED ORDER — FENTANYL CITRATE (PF) 100 MCG/2ML IJ SOLN
INTRAMUSCULAR | Status: DC | PRN
  Administered 2023-02-04: 50 ug via INTRAVENOUS

## 2023-02-04 MED ORDER — HYDRALAZINE HCL 20 MG/ML IJ SOLN: 10.0000 mg | INTRAMUSCULAR | Status: AC | PRN

## 2023-02-04 MED ORDER — LABETALOL HCL 5 MG/ML IV SOLN
10.0000 mg | INTRAVENOUS | Status: AC | PRN
  Administered 2023-02-04: 10 mg via INTRAVENOUS
  Filled 2023-02-04: qty 4

## 2023-02-04 MED ORDER — AMIODARONE HCL IN DEXTROSE 360-4.14 MG/200ML-% IV SOLN
30.0000 mg/h | INTRAVENOUS | Status: DC
  Administered 2023-02-05: 30 mg/h via INTRAVENOUS
  Filled 2023-02-04 (×2): qty 200

## 2023-02-04 MED ORDER — ONDANSETRON HCL 4 MG/2ML IJ SOLN
4.0000 mg | Freq: Once | INTRAMUSCULAR | Status: AC
  Administered 2023-02-04: 4 mg via INTRAVENOUS
  Filled 2023-02-04: qty 2

## 2023-02-04 MED ORDER — ASPIRIN 81 MG PO TBEC
81.0000 mg | DELAYED_RELEASE_TABLET | Freq: Every day | ORAL | Status: DC
  Administered 2023-02-04 – 2023-02-06 (×3): 81 mg via ORAL
  Filled 2023-02-04 (×3): qty 1

## 2023-02-04 MED ORDER — FENTANYL CITRATE (PF) 100 MCG/2ML IJ SOLN
INTRAMUSCULAR | Status: AC
  Filled 2023-02-04: qty 2

## 2023-02-04 MED ORDER — ATORVASTATIN CALCIUM 80 MG PO TABS
80.0000 mg | ORAL_TABLET | Freq: Every day | ORAL | Status: DC
Start: 2023-02-04 — End: 2023-02-07
  Administered 2023-02-04 – 2023-02-06 (×3): 80 mg via ORAL
  Filled 2023-02-04 (×3): qty 1

## 2023-02-04 MED ORDER — AMLODIPINE BESYLATE 5 MG PO TABS
2.5000 mg | ORAL_TABLET | Freq: Every day | ORAL | Status: DC
  Administered 2023-02-04 – 2023-02-05 (×2): 2.5 mg via ORAL
  Filled 2023-02-04 (×2): qty 1

## 2023-02-04 MED ORDER — FENTANYL CITRATE PF 50 MCG/ML IJ SOSY
25.0000 ug | PREFILLED_SYRINGE | Freq: Once | INTRAMUSCULAR | Status: AC
  Administered 2023-02-04: 25 ug via INTRAVENOUS
  Filled 2023-02-04: qty 1

## 2023-02-04 MED ORDER — FUROSEMIDE 10 MG/ML IJ SOLN
INTRAMUSCULAR | Status: AC
  Administered 2023-02-04: 40 mg via INTRAVENOUS
  Filled 2023-02-04: qty 4

## 2023-02-04 MED ORDER — LIDOCAINE HCL (PF) 1 % IJ SOLN
INTRAMUSCULAR | Status: DC | PRN
  Administered 2023-02-04: 2 mL

## 2023-02-04 MED ORDER — AMIODARONE HCL IN DEXTROSE 360-4.14 MG/200ML-% IV SOLN
60.0000 mg/h | INTRAVENOUS | Status: AC
  Administered 2023-02-04 (×2): 60 mg/h via INTRAVENOUS

## 2023-02-04 MED ORDER — ALBUTEROL SULFATE (2.5 MG/3ML) 0.083% IN NEBU: 2.5000 mg | INHALATION_SOLUTION | Freq: Four times a day (QID) | RESPIRATORY_TRACT | Status: DC | PRN

## 2023-02-04 MED ORDER — HEPARIN (PORCINE) 25000 UT/250ML-% IV SOLN
1900.0000 [IU]/h | INTRAVENOUS | Status: DC
  Administered 2023-02-05: 1600 [IU]/h via INTRAVENOUS
  Administered 2023-02-06: 1800 [IU]/h via INTRAVENOUS
  Filled 2023-02-04 (×2): qty 250

## 2023-02-04 MED ORDER — AMIODARONE HCL 200 MG PO TABS
200.0000 mg | ORAL_TABLET | Freq: Every day | ORAL | Status: DC
  Administered 2023-02-05: 200 mg via ORAL
  Filled 2023-02-04: qty 1

## 2023-02-04 MED ORDER — HEPARIN SODIUM (PORCINE) 1000 UNIT/ML IJ SOLN
INTRAMUSCULAR | Status: AC
Start: 2023-02-04 — End: ?
  Filled 2023-02-04: qty 10

## 2023-02-04 MED ORDER — HEPARIN SODIUM (PORCINE) 5000 UNIT/ML IJ SOLN: 5000.0000 [IU] | Freq: Three times a day (TID) | INTRAMUSCULAR | Status: DC

## 2023-02-04 MED ORDER — HEPARIN SODIUM (PORCINE) 1000 UNIT/ML IJ SOLN
INTRAMUSCULAR | Status: DC | PRN
  Administered 2023-02-04: 6000 [IU] via INTRAVENOUS

## 2023-02-04 MED ORDER — SODIUM CHLORIDE 0.9% FLUSH: 3.0000 mL | INTRAVENOUS | Status: DC | PRN

## 2023-02-04 MED ORDER — ORAL CARE MOUTH RINSE: 15.0000 mL | OROMUCOSAL | Status: DC | PRN

## 2023-02-04 MED ORDER — SPIRONOLACTONE 12.5 MG HALF TABLET
12.5000 mg | ORAL_TABLET | Freq: Every day | ORAL | Status: DC
  Administered 2023-02-04 – 2023-02-05 (×2): 12.5 mg via ORAL
  Filled 2023-02-04 (×2): qty 1

## 2023-02-04 MED ORDER — DAPAGLIFLOZIN PROPANEDIOL 10 MG PO TABS
10.0000 mg | ORAL_TABLET | Freq: Every day | ORAL | Status: DC
  Administered 2023-02-04 – 2023-02-06 (×3): 10 mg via ORAL
  Filled 2023-02-04 (×4): qty 1

## 2023-02-04 MED ORDER — HEPARIN (PORCINE) 25000 UT/250ML-% IV SOLN
INTRAVENOUS | Status: AC
  Administered 2023-02-04: 1500 [IU]/h via INTRAVENOUS
  Filled 2023-02-04: qty 250

## 2023-02-04 MED ORDER — ONDANSETRON HCL 4 MG/2ML IJ SOLN: 4.0000 mg | Freq: Four times a day (QID) | INTRAMUSCULAR | Status: DC | PRN

## 2023-02-04 MED ORDER — LIDOCAINE HCL (PF) 1 % IJ SOLN
INTRAMUSCULAR | Status: AC
  Filled 2023-02-04: qty 30

## 2023-02-04 MED ORDER — VERAPAMIL HCL 2.5 MG/ML IV SOLN
INTRAVENOUS | Status: DC | PRN
  Administered 2023-02-04: 10 mL via INTRA_ARTERIAL

## 2023-02-04 MED ORDER — SODIUM CHLORIDE 0.9% FLUSH
3.0000 mL | Freq: Two times a day (BID) | INTRAVENOUS | Status: DC
  Administered 2023-02-04 – 2023-02-05 (×3): 3 mL via INTRAVENOUS

## 2023-02-04 MED ORDER — ASPIRIN 81 MG PO CHEW
CHEWABLE_TABLET | ORAL | Status: AC
  Filled 2023-02-04: qty 1

## 2023-02-04 MED ORDER — RANOLAZINE ER 500 MG PO TB12
500.0000 mg | ORAL_TABLET | Freq: Two times a day (BID) | ORAL | Status: DC
  Administered 2023-02-04 – 2023-02-06 (×6): 500 mg via ORAL
  Filled 2023-02-04 (×7): qty 1

## 2023-02-04 MED ORDER — ASPIRIN 81 MG PO CHEW
CHEWABLE_TABLET | ORAL | Status: AC
  Filled 2023-02-04: qty 3

## 2023-02-04 MED ORDER — VERAPAMIL HCL 2.5 MG/ML IV SOLN
INTRAVENOUS | Status: AC
Start: 2023-02-04 — End: ?
  Filled 2023-02-04: qty 2

## 2023-02-04 MED ORDER — IOHEXOL 350 MG/ML SOLN
INTRAVENOUS | Status: DC | PRN
  Administered 2023-02-04: 35 mL

## 2023-02-04 MED ORDER — SODIUM CHLORIDE 0.9 % IV SOLN
INTRAVENOUS | Status: AC | PRN
  Administered 2023-02-04: 10 mL/h via INTRAVENOUS

## 2023-02-04 MED ORDER — FUROSEMIDE 10 MG/ML IJ SOLN: 40.0000 mg | Freq: Once | INTRAMUSCULAR | Status: AC

## 2023-02-04 MED ORDER — SODIUM CHLORIDE 0.9 % IV SOLN: 250.0000 mL | INTRAVENOUS | Status: DC | PRN

## 2023-02-04 MED ORDER — MORPHINE SULFATE (PF) 2 MG/ML IV SOLN
2.0000 mg | INTRAVENOUS | Status: DC | PRN
  Administered 2023-02-04 – 2023-02-05 (×7): 4 mg via INTRAVENOUS
  Administered 2023-02-05: 2 mg via INTRAVENOUS
  Administered 2023-02-05 (×3): 4 mg via INTRAVENOUS
  Administered 2023-02-06: 2 mg via INTRAVENOUS
  Administered 2023-02-06 – 2023-02-07 (×7): 4 mg via INTRAVENOUS
  Filled 2023-02-04 (×13): qty 2
  Filled 2023-02-04 (×2): qty 1
  Filled 2023-02-04 (×5): qty 2

## 2023-02-04 SURGICAL SUPPLY — 8 items
CATH INFINITI AMBI 5FR TG (CATHETERS) IMPLANT
DEVICE RAD COMP TR BAND LRG (VASCULAR PRODUCTS) IMPLANT
GLIDESHEATH SLEND A-KIT 6F 22G (SHEATH) IMPLANT
GUIDEWIRE INQWIRE 1.5J.035X260 (WIRE) IMPLANT
INQWIRE 1.5J .035X260CM (WIRE) ×1
KIT ENCORE 26 ADVANTAGE (KITS) IMPLANT
PACK CARDIAC CATHETERIZATION (CUSTOM PROCEDURE TRAY) ×1 IMPLANT
SET ATX-X65L (MISCELLANEOUS) IMPLANT

## 2023-02-04 NOTE — ED Notes (Signed)
Pt extubated

## 2023-02-04 NOTE — Progress Notes (Signed)
   02/04/23 0815  Spiritual Encounters  Type of Visit Attempt (pt unavailable)  Care provided to: Pt not available  Referral source Code page (STEMI)  Reason for visit Code  OnCall Visit Yes   Patient was taken to the cath lab. No family was present. Chaplain checked the 2 heart waiting area at 0800 as well and no family was present.   Arlyce Dice, Chaplain Resident [phone]

## 2023-02-04 NOTE — Progress Notes (Signed)
Patient is still complaining of chest pain from the CPR, despite Vicodin and morphine.  However, he appears to be resting more comfortably and is getting sleepy, so will not increase pain control medications.  He has had his home blood pressure medications, but his diastolic remains high, systolic blood pressure is also above goal.  Add amlodipine 2.5 mg daily.  Theodore Demark, PA-C 02/04/2023 4:50 PM

## 2023-02-04 NOTE — ED Triage Notes (Signed)
Pt bib [facility] from home. Pt c/o chest pain when ems arrived pain turned to stabbing and radiating. Patient was diaphoretic. When patient got into EMS truck he began seizing and went into afib. Pt was shocked 3x, 450mg  amiodarone, and 3 epi's. Ems vitals 127/73 Hr-78 Rr 24

## 2023-02-04 NOTE — Progress Notes (Signed)
PHARMACY - ANTICOAGULATION CONSULT NOTE  Pharmacy Consult for heparin Indication: atrial fibrillation  No Known Allergies  Patient Measurements: Height: 6\' 2"  (188 cm) Weight: 124.7 kg (275 lb) IBW/kg (Calculated) : 82.2 Heparin Dosing Weight: 109.3 kg   Vital Signs: Temp: 96 F (35.6 C) (11/09 0710) Temp Source: Tympanic (11/09 0710) BP: 124/90 (11/09 1515) Pulse Rate: 64 (11/09 1515)  Labs: Recent Labs    02/04/23 0704 02/04/23 1134  HGB 12.8* 13.7  HCT 41.8 43.5  PLT 202 191  CREATININE 1.63* 1.68*  TROPONINIHS 30* 263*    Estimated Creatinine Clearance: 64 mL/min (A) (by C-G formula based on SCr of 1.68 mg/dL (H)).   Medical History: Past Medical History:  Diagnosis Date   Acute diastolic heart failure (HCC)    Arthritis    CHF (congestive heart failure) (HCC)    Headache    Hypertension    Hypertensive urgency     Medications:  Scheduled:   [START ON 02/05/2023] amiodarone  200 mg Oral Daily   aspirin  324 mg Oral Once   aspirin EC  81 mg Oral Daily   atorvastatin  80 mg Oral Daily   Chlorhexidine Gluconate Cloth  6 each Topical Daily   dapagliflozin propanediol  10 mg Oral Daily   fluticasone  2 spray Each Nare Daily   heparin  5,000 Units Subcutaneous Q8H   metoprolol succinate  50 mg Oral Daily   ranolazine  500 mg Oral BID   sodium chloride flush  3 mL Intravenous Q12H   spironolactone  12.5 mg Oral Daily    Assessment: 62 yom presenting after Vfib arrest Underwent cardiac cath finding unchanged coronary disease compared to 10/20/2022. On apixaban PTA for hx Afib (LD 11/8 AM).  Patient being evaluated for possible ICD given Vfib arrest so apixaban on hold. Hgb 13.7, plt 191. No s/sx of bleeding. Sheath removed on 11/9@0828 .  Given recent DOAC will monitor both aPTT and heparin level until correlate.  Goal of Therapy:  Heparin level 0.3-0.7 units/ml aPTT 66-102 seconds Monitor platelets by anticoagulation protocol: Yes   Plan:  Start  heparin infusion at 1500 units/hr on 11/9 at 1630  Order 6 hour levels Monitor daily HL/aPTT until correlate, CBC, and for s/sx fo bleeding   Thank you for allowing pharmacy to participate in this patient's care,  Sherron Monday, PharmD, BCCCP Clinical Pharmacist  Phone: [phone] 02/04/2023 3:33 PM  Please check AMION for all [facility] Pharm[facility] phone numbers After 10:00 PM, call Main Pharmacy [phone]

## 2023-02-04 NOTE — H&P (Addendum)
Cardiology Admission History and Physical   Patient ID: Michael Bell MRN: [id number]; DOB: Feb 09, 1961   Admission date: 02/04/2023  PCP:  Marrianne Mood, MD   [facility] Providers Cardiologist:  Tessa Lerner, DO   Chief Complaint:  Chest pain  History of Present Illness:   Mr. Michael Bell is a 62 y.o. 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   Patient was at home this morning when he started having chest pain and diaphoresis. He called EMS. Reportedly by EMS, he went into Afib, followed by Vfib arrest requiring CPR and 3 shocks (strips not available for my review), ?epi, and brief placement of King's airway, which is since removed. There was reportedly some ?seizures before the cardiac arrest. IO access was obtained in the field. He was startedon IV amiodarone. Patient awake, alert, talking, complaining of chest pain as well as right shoulder pain, along with shortness of breath, since CPR. Post arrest EKG shows sinus rhyth, with lateral ST depressions which are new since previous EKGs prior to this admission.  Cath in 10/2022 showed distal Lcx occlusion, which was new compared to previous cath.   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;  Surgeon: Kerrin Champagne, MD;  Location: [facility] OR;  Service: Orthopedics;  Laterality: N/A;   NOSE SURGERY     40 yrs ago   RIGHT/LEFT HEART CATH AND CORONARY ANGIOGRAPHY N/A 08/05/2021   Procedure: RIGHT/LEFT HEART CATH AND CORONARY  ANGIOGRAPHY;  Surgeon: Yates Decamp, MD;  Location: [facility] INVASIVE CV LAB;  Service: Cardiovascular;  Laterality: N/A;   RIGHT/LEFT HEART CATH AND CORONARY ANGIOGRAPHY N/A 10/20/2022   Procedure: RIGHT/LEFT HEART CATH AND CORONARY ANGIOGRAPHY;  Surgeon: Elder Negus, MD;  Location: [facility] INVASIVE CV LAB;  Service: Cardiovascular;  Laterality: N/A;     Medications Prior to Admission: Prior to Admission medications   Medication Sig Start Date End Date Taking? Authorizing Provider  albuterol (PROVENTIL) (2.5 MG/3ML) 0.083% nebulizer solution Take 3 mLs (2.5 mg total) by nebulization every 6 (six) hours as needed for wheezing or shortness of breath. 11/17/22   Byrum, Les Pou, MD  albuterol (VENTOLIN HFA) 108 (90 Base) MCG/ACT inhaler Inhale 1-2 puffs into the lungs every 4 (four) hours as needed for wheezing or shortness of breath. 12/20/22   Marrianne Mood, MD  amiodarone (PACERONE) 200 MG tablet Take 1 tablet (200 mg total) by mouth daily. 11/11/22 02/27/23  Tolia, Sunit, DO  apixaban (ELIQUIS) 5 MG TABS tablet Take 1 tablet (5 mg total) by mouth 2 (two) times daily. 12/01/22   Tolia, Sunit, DO  aspirin EC 81 MG tablet Take 1 tablet (81 mg total) by mouth daily. Swallow whole. 10/23/22   Atway, Rayann N, DO  atorvastatin (LIPITOR) 80 MG tablet Take 1 tablet (80 mg total) by mouth daily. 12/20/22   Marrianne Mood, MD  dapagliflozin propanediol (FARXIGA) 10 MG TABS tablet Take 1 tablet (10 mg total) by mouth daily. 12/20/22   Marrianne Mood,  MD  fluticasone (FLONASE) 50 MCG/ACT nasal spray Place 2 sprays into both nostrils daily. 02/15/22   Marrianne Mood, MD  furosemide (LASIX) 40 MG tablet Take 1 tablet (40 mg total) by mouth daily. 12/20/22 12/20/23  Marrianne Mood, MD  isosorbide mononitrate (IMDUR) 60 MG 24 hr tablet Take 1 tablet (60 mg total) by mouth daily 11/11/22 02/27/23  Tolia, Sunit, DO  metoprolol succinate (TOPROL-XL) 50 MG 24 hr tablet Take 1 tablet (50 mg total) by mouth daily. Take  with or immediately following a meal. 11/11/22 02/27/23  Tolia, Sunit, DO  ranolazine (RANEXA) 500 MG 12 hr tablet Take 1 tablet (500 mg total) by mouth 2 (two) times daily. 11/11/22 02/27/23  Tolia, Sunit, DO  spironolactone (ALDACTONE) 25 MG tablet Take 1/2 tablet (12.5 mg total) by mouth daily. 11/11/22 02/19/23  Tolia, Sunit, DO  umeclidinium-vilanterol (ANORO ELLIPTA) 62.5-25 MCG/ACT AEPB Inhale 1 puff into the lungs daily. 10/22/22   Atway, Derwood Kaplan, DO     Allergies:   No Known Allergies  Social History:   Social History   Socioeconomic History   Marital status: Married    Spouse name: Not on file   Number of children: Not on file   Years of education: Not on file   Highest education level: Not on file  Occupational History   Not on file  Tobacco Use   Smoking status: Every Day    Current packs/day: 1.50    Average packs/day: 1.5 packs/day for 0.9 years (1.3 ttl pk-yrs)    Types: Cigarettes    Start date: 39    Last attempt to quit: 2023   Smokeless tobacco: Never  Vaping Use   Vaping status: Never Used  Substance and Sexual Activity   Alcohol use: Not Currently   Drug use: Not Currently    Types: Marijuana    Comment: 12/26/13   Sexual activity: Not on file  Other Topics Concern   Not on file  Social History Narrative   ** Merged History Encounter **       Social Determinants of Health   Financial Resource Strain: Not on file  Food Insecurity: No Food Insecurity (10/18/2022)   Hunger Vital Sign    Worried About Running Out of Food in the Last Year: Never true    Ran Out of Food in the Last Year: Never true  Transportation Needs: No Transportation Needs (10/18/2022)   PRAPARE - Administrator, Civil Service (Medical): No    Lack of Transportation (Non-Medical): No  Physical Activity: Not on file  Stress: Not on file  Social Connections: Not on file  Intimate Partner Violence: Not At Risk (10/18/2022)   Humiliation, Afraid, Rape, and Kick questionnaire     Fear of Current or Ex-Partner: No    Emotionally Abused: No    Physically Abused: No    Sexually Abused: No    Family History:   The patient's family history includes COPD in his maternal grandmother; Cancer in his mother, paternal uncle, and sister; Colon cancer (age of onset: 42) in his sister; Colon polyps in his father; Diabetes in his mother; Down syndrome in his paternal aunt; Heart disease in his brother, brother, father, and mother; Hyperlipidemia in his brother, father, and mother; Hypertension in his brother, brother, father, and mother; Mental illness in his mother; Ovarian cancer (age of onset: 75) in his mother; Stomach cancer in his paternal grandmother; Stroke in his father and mother.    ROS:  Please see  the history of present illness.  All other ROS reviewed and negative.     Physical Exam/Data:   Vitals:   02/04/23 0710 02/04/23 0715 02/04/23 0738  BP:  (!) 137/99   Pulse:  75   Resp:  14   Temp: (!) 96 F (35.6 C)    TempSrc: Tympanic    SpO2:  98%   Weight:   124.7 kg  Height:   6\' 2"  (1.88 m)   No intake or output data in the 24 hours ending 02/04/23 0805    02/04/2023    7:38 AM 11/17/2022   10:31 AM 11/11/2022   10:34 AM  Last 3 Weights  Weight (lbs) 275 lb 255 lb 12.8 oz 249 lb 9.6 oz  Weight (kg) 124.739 kg 116.03 kg 113.218 kg     Body mass index is 35.31 kg/m.   Physical Exam Vitals and nursing note reviewed.  Constitutional:      General: He is not in acute distress.    Appearance: He is obese.  Neck:     Vascular: No JVD.  Cardiovascular:     Rate and Rhythm: Normal rate and regular rhythm.     Heart sounds: Normal heart sounds. No murmur heard. Pulmonary:     Effort: Pulmonary effort is normal.     Breath sounds: Normal breath sounds. No wheezing or rales.  Musculoskeletal:     Right lower leg: Edema (1+) present.     Left lower leg: Edema (1+) present.      EKG:  The ECG that was done 02/04/2023 was personally reviewed and  demonstrates  Sinus rhythm Lateral ST depression  Relevant CV Studies: Cath 09/2022: LM: Normal LAD: No significant disease Ramus: No significant disease Lcx: Large, codominant. Very distal occlusion (unchanged since 07/2021) RCA: Small, codominant. 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    ChemistryNo results for input(s): "NA", "K", "CL", "CO2", "GLUCOSE", "BUN", "CREATININE", "CALCIUM", "MG", "GFRNONAA", "GFRAA", "ANIONGAP" in the last 168 hours.  No results for input(s): "PROT", "ALBUMIN", "AST", "ALT", "ALKPHOS", "BILITOT" in the last 168 hours. Lipids No results for input(s): "CHOL", "TRIG", "HDL", "LABVLDL", "LDLCALC", "CHOLHDL" in the last 168 hours. Hematology Recent Labs  Lab 02/04/23 0704  WBC 15.3*  RBC 4.70  HGB 12.8*  HCT 41.8  MCV 88.9  MCH 27.2  MCHC 30.6  RDW 15.6*  PLT 202   Thyroid No results for input(s): "TSH", "FREET4" in the last 168 hours. BNPNo results for input(s): "BNP", "PROBNP" in the last 168 hours.  DDimer No results for input(s): "DDIMER" in the last 168 hours.   Radiology/Studies:  No results found.   Assessment and Plan:   62 y.o. 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   V-fib arrest: Out-of-hospital arrest.  Rhythm strips not available for my review.  Reported by EMS and ER physician. Currently in sinus rhythm, with postarrest EKG showing lateral ST depression. He did report chest pain and diaphoresis before V-fib arrest. Will proceed with urgent  coronary  angiography and possible intervention.  Verbal consent obtained from the patient due to emergency nature. Patient has known nonischemic cardiomyopathy.  If cath does not show any new coronary occlusion, consider ICD for secondary prevention.  CAD: Distal left circumflex occlusion-last noted on cath in 09/2022, known prior. No other significant proximal disease to explain cardiomyopathy.  Paroxysmal A-fib: Currently in sinus rhythm  Right shoulder pain: Complaining of shoulder pain since CPR. Will obtain Xray.   Risk Assessment/Risk Scores:    TIMI Risk Score for Unstable Angina or Non-ST Elevation MI:   The patient's TIMI risk score is 5, which indicates a 26% risk of all cause mortality, new or recurrent myocardial infarction or need for urgent revascularization in the next 14 days.{  New York Heart Association (NYHA) Functional Class NYHA Class III  CHA2DS2-VASc Score = 3 This indicates a 3.2% annual risk of stroke. The patient's score is based upon: CHF History: 1 HTN History: 1 Diabetes History: 0 Stroke History: 0 Vascular Disease History: 1 Age Score: 0 Gender Score: 0     Code Status: Full Code  Severity of Illness: The appropriate patient status for this patient is INPATIENT. Inpatient status is judged to be reasonable and necessary in order to provide the required intensity of service to ensure the patient's safety. The patient's presenting symptoms, physical exam findings, and initial radiographic and laboratory data in the context of their chronic comorbidities is felt to place them at high risk for further clinical deterioration. Furthermore, it is not anticipated that the patient will be medically stable for discharge from the hospital within 2 midnights of admission.   * I certify that at the point of admission it is my clinical judgment that the patient will require inpatient hospital care spanning beyond 2 midnights from the point of admission due to  high intensity of service, high risk for further deterioration and high frequency of surveillance required.*   For questions or updates, please contact [facility] Please consult [email or web address] for contact info under   CRITICAL CARE Performed by: Truett Mainland   Total critical care time: 30 minutes   Critical care time was exclusive of separately billable procedures and treating other patients.   Critical care was necessary to treat or prevent imminent or life-threatening deterioration.   Critical care was time spent personally by me on the following activities: development of treatment plan with patient and/or surrogate as well as nursing, discussions with consultants, evaluation of patient's response to treatment, examination of patient, obtaining history from patient or surrogate, ordering and performing treatments and interventions, ordering and review of laboratory studies, ordering and review of radiographic studies, pulse oximetry and re-evaluation of patient's condition.      Signed, Elder Negus, MD 02/04/2023 8:05 AM

## 2023-02-04 NOTE — ED Provider Notes (Signed)
[facility] CARDIAC CATH LAB Provider Note   CSN: [id number] Arrival date & time: 02/04/23  0700     History  Chief Complaint  Patient presents with   post CPR    KEVAL WOLSKE is a 62 y.o. male.  Patient arrives with i-gel in place with EMS as a post CPR.  Patient called family at the house for chest pain.  When EMS arrived he was diaphoretic clutching his chest.  They brought him into the ambulance and he had loss of consciousness seizure-like activity went into V-fib.  He was shocked 3 times given amiodarone and given epinephrine 3 times.  CPR was initiated immediately.  They had return of spontaneous circulation after that.  He has been moving extremities he is had normal vitals.  They state he has a history of stroke but they do not have much history otherwise.  Level 5 caveat.  The history is provided by the patient and the EMS personnel.       Home Medications Prior to Admission medications   Medication Sig Start Date End Date Taking? Authorizing Provider  albuterol (PROVENTIL) (2.5 MG/3ML) 0.083% nebulizer solution Take 3 mLs (2.5 mg total) by nebulization every 6 (six) hours as needed for wheezing or shortness of breath. 11/17/22   Byrum, Les Pou, MD  albuterol (VENTOLIN HFA) 108 (90 Base) MCG/ACT inhaler Inhale 1-2 puffs into the lungs every 4 (four) hours as needed for wheezing or shortness of breath. 12/20/22   Marrianne Mood, MD  amiodarone (PACERONE) 200 MG tablet Take 1 tablet (200 mg total) by mouth daily. 11/11/22 02/27/23  Tolia, Sunit, DO  apixaban (ELIQUIS) 5 MG TABS tablet Take 1 tablet (5 mg total) by mouth 2 (two) times daily. 12/01/22   Tolia, Sunit, DO  aspirin EC 81 MG tablet Take 1 tablet (81 mg total) by mouth daily. Swallow whole. 10/23/22   Atway, Rayann N, DO  atorvastatin (LIPITOR) 80 MG tablet Take 1 tablet (80 mg total) by mouth daily. 12/20/22   Marrianne Mood, MD  dapagliflozin propanediol (FARXIGA) 10 MG TABS tablet Take 1 tablet  (10 mg total) by mouth daily. 12/20/22   Marrianne Mood, MD  fluticasone (FLONASE) 50 MCG/ACT nasal spray Place 2 sprays into both nostrils daily. 02/15/22   Marrianne Mood, MD  furosemide (LASIX) 40 MG tablet Take 1 tablet (40 mg total) by mouth daily. 12/20/22 12/20/23  Marrianne Mood, MD  isosorbide mononitrate (IMDUR) 60 MG 24 hr tablet Take 1 tablet (60 mg total) by mouth daily 11/11/22 02/27/23  Tolia, Sunit, DO  metoprolol succinate (TOPROL-XL) 50 MG 24 hr tablet Take 1 tablet (50 mg total) by mouth daily. Take with or immediately following a meal. 11/11/22 02/27/23  Tolia, Sunit, DO  ranolazine (RANEXA) 500 MG 12 hr tablet Take 1 tablet (500 mg total) by mouth 2 (two) times daily. 11/11/22 02/27/23  Tolia, Sunit, DO  spironolactone (ALDACTONE) 25 MG tablet Take 1/2 tablet (12.5 mg total) by mouth daily. 11/11/22 02/19/23  Tolia, Sunit, DO  umeclidinium-vilanterol (ANORO ELLIPTA) 62.5-25 MCG/ACT AEPB Inhale 1 puff into the lungs daily. 10/22/22   Chauncey Mann, DO      Allergies    Patient has no known allergies.    Review of Systems   Review of Systems  Physical Exam Updated Vital Signs BP (!) 137/99   Pulse 75   Temp (!) 96 F (35.6 C) (Tympanic)   Resp 14   Ht 6\' 2"  (1.88 m)   Wt  124.7 kg   SpO2 98%   BMI 35.31 kg/m  Physical Exam Vitals and nursing note reviewed.  Constitutional:      General: He is in acute distress.     Appearance: He is well-developed. He is ill-appearing and diaphoretic.  HENT:     Head: Normocephalic and atraumatic.  Eyes:     Conjunctiva/sclera: Conjunctivae normal.  Cardiovascular:     Rate and Rhythm: Normal rate and regular rhythm.     Pulses: Normal pulses.     Heart sounds: Normal heart sounds. No murmur heard. Pulmonary:     Comments: I gel in place, coarse breath sounds throughout Abdominal:     Palpations: Abdomen is soft.     Tenderness: There is no abdominal tenderness.  Musculoskeletal:        General: No swelling.      Cervical back: Neck supple.  Skin:    General: Skin is warm.     Capillary Refill: Capillary refill takes less than 2 seconds.  Neurological:     Comments: Following commands, opening eyes spontaneously, moving all extremities  Psychiatric:        Mood and Affect: Mood normal.     ED Results / Procedures / Treatments   Labs (all labs ordered are listed, but only abnormal results are displayed) Labs Reviewed  CBC WITH DIFFERENTIAL/PLATELET - Abnormal; Notable for the following components:      Result Value   WBC 15.3 (*)    Hemoglobin 12.8 (*)    RDW 15.6 (*)    Lymphs Abs 6.7 (*)    Abs Immature Granulocytes 0.28 (*)    All other components within normal limits  COMPREHENSIVE METABOLIC PANEL  I-STAT CHEM 8, ED  I-STAT VENOUS BLOOD GAS, ED  TROPONIN I (HIGH SENSITIVITY)    EKG EKG Interpretation Date/Time:  Saturday February 04 2023 07:10:25 EST Ventricular Rate:  80 PR Interval:  120 QRS Duration:  131 QT Interval:  667 QTC Calculation: 770 R Axis:   -15  Text Interpretation: Sinus rhythm Sinus pause Nonspecific intraventricular conduction delay Repol abnrm, global ischemia, diffuse leads Confirmed by Virgina Norfolk [phone]) on 02/04/2023 8:03:56 AM  Radiology No results found.  Procedures .Critical Care  Performed by: Virgina Norfolk, DO Authorized by: Virgina Norfolk, DO   Critical care provider statement:    Critical care time (minutes):  40   Critical care was necessary to treat or prevent imminent or life-threatening deterioration of the following conditions:  Cardiac failure   Critical care was time spent personally by me on the following activities:  Blood draw for specimens, development of treatment plan with patient or surrogate, discussions with primary provider, evaluation of patient's response to treatment, examination of patient, obtaining history from patient or surrogate, ordering and performing treatments and interventions, ordering and review of  laboratory studies, ordering and review of radiographic studies, pulse oximetry, re-evaluation of patient's condition and review of old charts   I assumed direction of critical care for this patient from another provider in my specialty: no       Medications Ordered in ED Medications  heparin 60454 UT/250ML infusion (has no administration in time range)  ondansetron (ZOFRAN) injection 4 mg ( Intravenous MAR Hold 02/04/23 0758)  aspirin chewable tablet 324 mg ( Oral MAR Hold 02/04/23 0758)  amiodarone (NEXTERONE PREMIX) 360-4.14 MG/200ML-% (1.8 mg/mL) IV infusion (60 mg/hr Intravenous New Bag/Given 02/04/23 0733)  amiodarone (NEXTERONE PREMIX) 360-4.14 MG/200ML-% (1.8 mg/mL) IV infusion (has no administration in time range)  Heparin (Porcine) in NaCl 1000-0.9 UT/500ML-% SOLN (500 mLs  Given 02/04/23 0805)  furosemide (LASIX) injection 40 mg (40 mg Intravenous Given 02/04/23 0748)  fentaNYL (SUBLIMAZE) injection 25 mcg (25 mcg Intravenous Given 02/04/23 0750)    ED Course/ Medical Decision Making/ A&P Clinical Course as of 02/04/23 1610  Sat Feb 04, 2023  0804 DG Chest Portable 1 View [AC]    Clinical Course User Index [AC] Virgina Norfolk, DO                                 Medical Decision Making Amount and/or Complexity of Data Reviewed Labs: ordered. Radiology: ordered. Decision-making details documented in ED Course.  Risk OTC drugs. Prescription drug management. Decision regarding hospitalization.   REDRICK NORDAHL is here status post CPR.  Patient called out EMS for chest pain.  He was diaphoretic, lost pulses in the ambulance with immediate CPR.  Went into ventricular fibrillation was shocked 3 times given amiodarone and 3 epis and have return of spontaneous circulation about 10 to 12 minutes post CPR starting.  He arrives with normal vitals.  He is awake and following commands moving extremities.  I-gel was removed and patient was maintaining airway on his own.  Awake and alert and  appears neurologically intact.  He still having chest pain and diaphoretic.  He states he has been taking his Eliquis.  He has a history of heart failure.  He had a cardiac cath a couple months ago that showed some disease in the RCA and circumflex.  EKG upon arrival here with ST depressions in the lateral leads.  I called a code STEMI given the clinical picture talked with Dr. Aggie Cosier and overall he will take the patient to the Cath Lab.  Lab work has been collected including chest x-ray.  I do suspect some pulmonary contusions/possible aspiration.  He is on 4 L of oxygen.  He is not having any abdominal pain headache and is got normal pulses in all 4 extremities.  I do not think there is another emergent process going on and I do think that this was possibly an ischemic process.  Overall plan is to take patient to the Cath Lab.  Chest x-ray shows pulmonary edema but do not see any obvious pneumothorax.  Radiology report is pending.  Lab work pending at time of patient transfer to the Cath Lab.  Heparin bolus and infusion has been started for NSTEMI.  Patient given aspirin.  Hemodynamically stable throughout my care.  This chart was dictated using voice recognition software.  Despite best efforts to proofread,  errors can occur which can change the documentation meaning.         Final Clinical Impression(s) / ED Diagnoses Final diagnoses:  Cardiac arrest with ventricular fibrillation [facility])    Rx / DC Orders ED Discharge Orders     None         Virgina Norfolk, DO 02/04/23 9604

## 2023-02-04 NOTE — ED Notes (Signed)
Called patient ex. Wife at patient request Michael Bell [phone] no answer message left.

## 2023-02-04 NOTE — Progress Notes (Signed)
RT note. Patient placed on ventilator briefly when brought in by ems. Patient with I gel - removed per MD at bedside, patient waking up.  Patient placed on [place] sat 94%. RT will continue to monitor.

## 2023-02-04 NOTE — Plan of Care (Signed)
  Problem: Education: Goal: Knowledge of General Education information will improve Description: Including pain rating scale, medication(s)/side effects and non-pharmacologic comfort measures Outcome: Progressing   Problem: Health Behavior/Discharge Planning: Goal: Ability to manage health-related needs will improve Outcome: Progressing   Problem: Clinical Measurements: Goal: Ability to maintain clinical measurements within normal limits will improve Outcome: Progressing Goal: Will remain free from infection Outcome: Progressing Goal: Diagnostic test results will improve Outcome: Progressing Goal: Respiratory complications will improve Outcome: Progressing Goal: Cardiovascular complication will be avoided Outcome: Progressing   Problem: Activity: Goal: Risk for activity intolerance will decrease Outcome: Progressing   Problem: Nutrition: Goal: Adequate nutrition will be maintained Outcome: Progressing   Problem: Coping: Goal: Level of anxiety will decrease Outcome: Progressing   Problem: Elimination: Goal: Will not experience complications related to bowel motility Outcome: Progressing Goal: Will not experience complications related to urinary retention Outcome: Progressing   Problem: Pain Management: Goal: General experience of comfort will improve Outcome: Progressing   Problem: Safety: Goal: Ability to remain free from injury will improve Outcome: Progressing   Problem: Skin Integrity: Goal: Risk for impaired skin integrity will decrease Outcome: Progressing   Problem: Education: Goal: Understanding of CV disease, CV risk reduction, and recovery process will improve Outcome: Progressing   Problem: Activity: Goal: Ability to return to baseline activity level will improve Outcome: Progressing   Problem: Cardiovascular: Goal: Ability to achieve and maintain adequate cardiovascular perfusion will improve Outcome: Progressing Goal: Vascular access site(s)  Level 0-1 will be maintained Outcome: Progressing   Problem: Health Behavior/Discharge Planning: Goal: Ability to safely manage health-related needs after discharge will improve Outcome: Progressing

## 2023-02-04 NOTE — ED Notes (Signed)
Cardiologist at bedside.  

## 2023-02-05 DIAGNOSIS — I4901 Ventricular fibrillation: Secondary | ICD-10-CM | POA: Diagnosis not present

## 2023-02-05 LAB — LIPID PANEL
Cholesterol: 121 mg/dL (ref 0–200)
HDL: 43 mg/dL (ref >40)
LDL Cholesterol: 71 mg/dL (ref 0–99)
Total CHOL/HDL Ratio: 2.8 {ratio}
Triglycerides: 34 mg/dL (ref <150)
VLDL: 7 mg/dL (ref 0–40)

## 2023-02-05 LAB — BASIC METABOLIC PANEL
Anion gap: 12 (ref 5–15)
BUN: 29 mg/dL — ABNORMAL HIGH (ref 8–23)
CO2: 16 mmol/L — ABNORMAL LOW (ref 22–32)
Calcium: 8.5 mg/dL — ABNORMAL LOW (ref 8.9–10.3)
Chloride: 104 mmol/L (ref 98–111)
Creatinine, Ser: 1.91 mg/dL — ABNORMAL HIGH (ref 0.61–1.24)
GFR, Estimated: 39 mL/min — ABNORMAL LOW (ref >60)
Glucose, Bld: 143 mg/dL — ABNORMAL HIGH (ref 70–99)
Potassium: 4.7 mmol/L (ref 3.5–5.1)
Sodium: 132 mmol/L — ABNORMAL LOW (ref 135–145)

## 2023-02-05 LAB — HEPARIN LEVEL (UNFRACTIONATED)
Heparin Unfractionated: 0.27 [IU]/mL — ABNORMAL LOW (ref 0.30–0.70)
Heparin Unfractionated: 0.24 [IU]/mL — ABNORMAL LOW (ref 0.30–0.70)
Heparin Unfractionated: 0.34 [IU]/mL (ref 0.30–0.70)

## 2023-02-05 LAB — APTT
aPTT: 55 s — ABNORMAL HIGH (ref 24–36)
aPTT: 48 s — ABNORMAL HIGH (ref 24–36)

## 2023-02-05 LAB — MAGNESIUM: Magnesium: 2.1 mg/dL (ref 1.7–2.4)

## 2023-02-05 MED ORDER — AMIODARONE HCL 200 MG PO TABS
200.0000 mg | ORAL_TABLET | Freq: Two times a day (BID) | ORAL | Status: DC
  Administered 2023-02-05 – 2023-02-06 (×4): 200 mg via ORAL
  Filled 2023-02-05 (×4): qty 1

## 2023-02-05 MED ORDER — NICOTINE 21 MG/24HR TD PT24
21.0000 mg | MEDICATED_PATCH | Freq: Every day | TRANSDERMAL | Status: DC
  Administered 2023-02-05 – 2023-02-06 (×2): 21 mg via TRANSDERMAL
  Filled 2023-02-05 (×2): qty 1

## 2023-02-05 NOTE — Progress Notes (Addendum)
PHARMACY - ANTICOAGULATION CONSULT NOTE  Pharmacy Consult for heparin Indication: atrial fibrillation  No Known Allergies  Patient Measurements: Height: 6\' 2"  (188 cm) Weight: 124.7 kg (275 lb) IBW/kg (Calculated) : 82.2 Heparin Dosing Weight: 109.3 kg   Vital Signs: Temp: 98.1 F (36.7 C) (11/09 2300) Temp Source: Oral (11/09 2300) BP: 135/92 (11/10 0600) Pulse Rate: 67 (11/10 0600)  Labs: Recent Labs    02/04/23 0704 02/04/23 1134 02/04/23 2128 02/05/23 0249  HGB 12.8* 13.7  --   --   HCT 41.8 43.5  --   --   PLT 202 191  --   --   APTT  --   --  49* 55*  HEPARINUNFRC  --   --  0.25* 0.27*  CREATININE 1.63* 1.68*  --  1.91*  TROPONINIHS 30* 263*  --   --     Estimated Creatinine Clearance: 56.3 mL/min (A) (by C-G formula based on SCr of 1.91 mg/dL (H)).   Medical History: Past Medical History:  Diagnosis Date   Acute diastolic heart failure (HCC)    Arthritis    CHF (congestive heart failure) (HCC)    Headache    Hypertension    Hypertensive urgency     Medications:  Scheduled:   amiodarone  200 mg Oral Daily   amLODipine  2.5 mg Oral Daily   aspirin  324 mg Oral Once   aspirin EC  81 mg Oral Daily   atorvastatin  80 mg Oral Daily   Chlorhexidine Gluconate Cloth  6 each Topical Daily   dapagliflozin propanediol  10 mg Oral Daily   fluticasone  2 spray Each Nare Daily   metoprolol succinate  50 mg Oral Daily   ranolazine  500 mg Oral BID   sodium chloride flush  3 mL Intravenous Q12H   spironolactone  12.5 mg Oral Daily    Assessment: Michael Bell presenting after Vfib arrest. Underwent cardiac cath finding unchanged coronary disease compared to 10/20/2022. On apixaban PTA for hx Afib (LD 11/8 AM). Patient now being evaluated for possible ICD given Vfib arrest so apixaban on hold. CBC stable with Hgb 13.7, plt 191. No s/sx of bleeding.   HL is subtherapeutic at 0.24 on IV UFH at 1600 units/hour.  Heparin level and aPTT appear to be correlating already  indicating that patient may not have been taking Eliquis as directed. Will stop daily aPTT for now.  Goal of Therapy:  Heparin level 0.3-0.7 units/ml aPTT 66-102 seconds Monitor platelets by anticoagulation protocol: Yes   Plan:  Increase heparin infusion to 1800 units/hr Order 8 hour level Monitor daily HL, CBC, and for s/sx fo bleeding   Thank you for allowing pharmacy to participate in this patient's care,  Wilmer Floor, PharmD PGY2 Cardiology Pharmacy Resident Phone: [phone] 02/05/2023 6:54 AM  Please check AMION for all [facility] Pharmacy phone numbers After 10:00 PM, call Main Pharmacy [phone]

## 2023-02-05 NOTE — Progress Notes (Signed)
Patient Name: Michael Bell Date of Encounter: 02/05/2023 [facility] Cardiologist: Tessa Lerner, DO   Interval Summary  .    Patient underwent left heart cath yesterday with no changes compared to prior.  No acute overnight events.  Continues to report some soreness in his chest from CPR.  No new or acute complaints today.  Vital Signs .    Vitals:   02/05/23 0600 02/05/23 0759 02/05/23 0800 02/05/23 0902  BP: (!) 135/92 (!) 126/91 (!) 126/91 (!) 131/95  Pulse: 67 67 65 67  Resp: (!) 22 17 19    Temp:  99.2 F (37.3 C)    TempSrc:  Oral    SpO2: 96% 91% 91%   Weight:      Height:        Intake/Output Summary (Last 24 hours) at 02/05/2023 0953 Last data filed at 02/05/2023 0600 Gross per 24 hour  Intake 503.36 ml  Output 1225 ml  Net -721.64 ml      02/04/2023    7:38 AM 11/17/2022   10:31 AM 11/11/2022   10:34 AM  Last 3 Weights  Weight (lbs) 275 lb 255 lb 12.8 oz 249 lb 9.6 oz  Weight (kg) 124.739 kg 116.03 kg 113.218 kg      Telemetry/ECG    PVCs.  No sustained arrhythmias.- Personally Reviewed  Physical Exam .   GEN: No acute distress.   Neck: No JVD Cardiac: Normal rate and regular rhythm. Respiratory: Clear to auscultation bilaterally. GI: Soft, nontender, non-distended  MS: No edema  Echo:   1. Left ventricular ejection fraction, by estimation, is <20%. The left  ventricle has severely decreased function. The left ventricle demonstrates  global hypokinesis. The left ventricular internal cavity size was mildly  dilated. There is mild concentric   left ventricular hypertrophy. Left ventricular diastolic parameters are  consistent with Grade II diastolic dysfunction (pseudonormalization).  Elevated left atrial pressure.   2. Right ventricular systolic function is severely reduced. The right  ventricular size is severely enlarged. The estimated right ventricular  systolic pressure is 41.2 mmHg.   3. Left atrial size was severely dilated.    4. Right atrial size was severely dilated.   5. The mitral valve is normal in structure. No evidence of mitral valve  regurgitation.   6. Tricuspid valve regurgitation is mild to moderate.   7. The aortic valve is tricuspid. There is mild thickening of the aortic  valve. Aortic valve regurgitation is not visualized. No aortic stenosis is  present.   8. The inferior vena cava is dilated in size with <50% respiratory  variability, suggesting right atrial pressure of 15 mmHg.    ECG:    Assessment & Plan .     Michael Bell is a 62 y.o. 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  He was brought to the cardiac Cath Lab and left heart catheterization was performed.  This did not show any acute process and was overall unchanged from his last cardiac catheterization in August 2024.  Review of ECG strips by EMS in the media tab of his medical chart appear to show polymorphic VT.   #. Out-of-hospital cardiac arrest: #. Polymorphic VT #. PVCs -Given that there is no obvious ischemic etiology that  is reversible, patient would meet criteria for secondary prevention ICD.  This was offered to him and he would like to discuss it with his wife. -Continue oral amiodarone to suppress PVCs.  Is unclear if he was actually taking this medication at home.  #. Acute on chronic systolic heart failure: LVEF now less than 20%, previously 35 to 40%. -Continue metoprolol XL 50 mg once daily and uptitrate as able. -Continue to [place]. -Start ACE/ARB/ARNI when creatinine normalizes. -Restart home spironolactone once creatinine normalizes. -Stop amlodipine.  #. AKI: Likely ischemic injury to kidneys during cardiac arrest. -Continue to  monitor. -Start ACE/ARB/ARNI when creatinine normalizes. -Restart home spironolactone once creatinine normalizes.  #. CAD - No acute process.  - Continue aspirin and statin  #. Atrial fibrillation - Continue heparin for now. Will stop prior to ICD implant if patient wishes to proceed.   For questions or updates, please contact [facility] Please consult [email or web address] for contact info under        Signed, Nobie Putnam, MD

## 2023-02-05 NOTE — Progress Notes (Addendum)
PHARMACY - ANTICOAGULATION CONSULT NOTE  Pharmacy Consult for heparin Indication: atrial fibrillation  Labs: Recent Labs    02/04/23 0704 02/04/23 1134 02/04/23 2128  HGB 12.8* 13.7  --   HCT 41.8 43.5  --   PLT 202 191  --   APTT  --   --  49*  HEPARINUNFRC  --   --  0.25*  CREATININE 1.63* 1.68*  --   TROPONINIHS 30* 263*  --     Assessment/Plan:  62yo male subtherapeutic on heparin with initial dosing while DOAC on hold but labs were drawn early and suspect level will continue to trend up. Will continue infusion at current rate of 1500 units/hr and recheck with am labs.  Vernard Gambles, PharmD, BCPS 02/05/2023 1:10 AM     Addendum: Heparin level and PTT this am remain low (0.27, 55).  Will increase heparin gtt by 1 unit/kgABW/hr to 1600 units/hr and check level in 6-8 hours.   VB  4:27 AM

## 2023-02-05 NOTE — Progress Notes (Signed)
PHARMACY - ANTICOAGULATION CONSULT NOTE  Pharmacy Consult for heparin Indication: atrial fibrillation  No Known Allergies  Patient Measurements: Height: 6\' 2"  (188 cm) Weight: 124.7 kg (275 lb) IBW/kg (Calculated) : 82.2 Heparin Dosing Weight: 109.3 kg   Vital Signs: Temp: 99.2 F (37.3 C) (11/10 1950) Temp Source: Oral (11/10 1950) BP: 135/92 (11/10 1800) Pulse Rate: 64 (11/10 1700)  Labs: Recent Labs    02/04/23 0704 02/04/23 1134 02/04/23 2128 02/04/23 2128 02/05/23 0249 02/05/23 1119 02/05/23 1924  HGB 12.8* 13.7  --   --   --   --   --   HCT 41.8 43.5  --   --   --   --   --   PLT 202 191  --   --   --   --   --   APTT  --   --  49*  --  55* 48*  --   HEPARINUNFRC  --   --  0.25*   < > 0.27* 0.24* 0.34  CREATININE 1.63* 1.68*  --   --  1.91*  --   --   TROPONINIHS 30* 263*  --   --   --   --   --    < > = values in this interval not displayed.    Estimated Creatinine Clearance: 56.3 mL/min (A) (by C-G formula based on SCr of 1.91 mg/dL (H)).   Medical History: Past Medical History:  Diagnosis Date   Acute diastolic heart failure (HCC)    Arthritis    CHF (congestive heart failure) (HCC)    Headache    Hypertension    Hypertensive urgency     Medications:  Scheduled:   amiodarone  200 mg Oral BID   aspirin EC  81 mg Oral Daily   atorvastatin  80 mg Oral Daily   Chlorhexidine Gluconate Cloth  6 each Topical Daily   dapagliflozin propanediol  10 mg Oral Daily   fluticasone  2 spray Each Nare Daily   metoprolol succinate  50 mg Oral Daily   nicotine  21 mg Transdermal Daily   ranolazine  500 mg Oral BID    Assessment: 62 yom presenting after Vfib arrest. Underwent cardiac cath finding unchanged coronary disease compared to 10/20/2022. On apixaban PTA for hx Afib (LD 11/8 AM). Patient now being evaluated for possible ICD given Vfib arrest so apixaban on hold. CBC stable with Hgb 13.7, plt 191. No s/sx of bleeding.   02/05/23 morning heparin level  and aPTT appear to be correlating already indicating that patient may not have been taking Eliquis as directed. Thus, we have stopped daily aPTT labs for now.  Will monitor heparin using heparin level (HL).  11/0/24 Pm:  8 hour  HL is therapeutic at 0.34 after IV UFH increased to 1800 units/hour.   Goal of Therapy:  Heparin level 0.3-0.7 units/ml aPTT 66-102 seconds Monitor platelets by anticoagulation protocol: Yes   Plan:  Continue Heparin infusion at 1800 units/hr Monitor daily HL, CBC, and for s/sx fo bleeding   Thank you for allowing pharmacy to participate in this patient's care,   Noah Delaine, RPh Clinical Pharmacist 02/05/2023 7:59 PM  Please check AMION for all [facility] Pharmacy phone numbers After 10:00 PM, call Main Pharmacy [phone]

## 2023-02-06 ENCOUNTER — Other Ambulatory Visit: Payer: Self-pay

## 2023-02-06 ENCOUNTER — Inpatient Hospital Stay (HOSPITAL_COMMUNITY)
Admission: EM | Disposition: A | Discharge status: Home / Self Care | Payer: Self-pay | Source: Home / Self Care | Attending: Cardiology

## 2023-02-06 ENCOUNTER — Encounter (HOSPITAL_COMMUNITY): Payer: Self-pay | Admitting: Cardiology

## 2023-02-06 DIAGNOSIS — I255 Ischemic cardiomyopathy: Secondary | ICD-10-CM

## 2023-02-06 DIAGNOSIS — I4901 Ventricular fibrillation: Secondary | ICD-10-CM | POA: Diagnosis not present

## 2023-02-06 HISTORY — PX: ICD IMPLANT: EP1208

## 2023-02-06 LAB — CBC
HCT: 38.4 % — ABNORMAL LOW (ref 39.0–52.0)
Hemoglobin: 12.4 g/dL — ABNORMAL LOW (ref 13.0–17.0)
MCH: 27.9 pg (ref 26.0–34.0)
MCHC: 32.3 g/dL (ref 30.0–36.0)
MCV: 86.3 fL (ref 80.0–100.0)
Platelets: 152 10*3/uL (ref 150–400)
RBC: 4.45 MIL/uL (ref 4.22–5.81)
RDW: 15.9 % — ABNORMAL HIGH (ref 11.5–15.5)
WBC: 12.9 10*3/uL — ABNORMAL HIGH (ref 4.0–10.5)
nRBC: 0 % (ref 0.0–0.2)

## 2023-02-06 LAB — BASIC METABOLIC PANEL
Anion gap: 8 (ref 5–15)
BUN: 32 mg/dL — ABNORMAL HIGH (ref 8–23)
CO2: 21 mmol/L — ABNORMAL LOW (ref 22–32)
Calcium: 8.6 mg/dL — ABNORMAL LOW (ref 8.9–10.3)
Chloride: 104 mmol/L (ref 98–111)
Creatinine, Ser: 1.9 mg/dL — ABNORMAL HIGH (ref 0.61–1.24)
GFR, Estimated: 39 mL/min — ABNORMAL LOW (ref >60)
Glucose, Bld: 99 mg/dL (ref 70–99)
Potassium: 4.3 mmol/L (ref 3.5–5.1)
Sodium: 133 mmol/L — ABNORMAL LOW (ref 135–145)

## 2023-02-06 LAB — HEPARIN LEVEL (UNFRACTIONATED): Heparin Unfractionated: 0.29 [IU]/mL — ABNORMAL LOW (ref 0.30–0.70)

## 2023-02-06 LAB — SURGICAL PCR SCREEN
MRSA, PCR: NEGATIVE
Staphylococcus aureus: NEGATIVE

## 2023-02-06 LAB — MAGNESIUM: Magnesium: 2 mg/dL (ref 1.7–2.4)

## 2023-02-06 SURGERY — ICD IMPLANT

## 2023-02-06 MED ORDER — LIDOCAINE HCL (PF) 1 % IJ SOLN
INTRAMUSCULAR | Status: DC | PRN
  Administered 2023-02-06: 30 mL

## 2023-02-06 MED ORDER — FENTANYL CITRATE (PF) 100 MCG/2ML IJ SOLN
INTRAMUSCULAR | Status: DC | PRN
  Administered 2023-02-06: 12.5 ug via INTRAVENOUS
  Administered 2023-02-06: 25 ug via INTRAVENOUS

## 2023-02-06 MED ORDER — CHLORHEXIDINE GLUCONATE 4 % EX SOLN: 60.0000 mL | Freq: Once | CUTANEOUS | Status: DC

## 2023-02-06 MED ORDER — SODIUM CHLORIDE 0.9 % IV SOLN: INTRAVENOUS | Status: DC

## 2023-02-06 MED ORDER — SODIUM CHLORIDE 0.9 % IV SOLN
INTRAVENOUS | Status: DC | PRN
  Administered 2023-02-06: 80 mg

## 2023-02-06 MED ORDER — SODIUM CHLORIDE 0.9 % IV SOLN
80.0000 mg | INTRAVENOUS | Status: AC
  Administered 2023-02-06: 80 mg
  Filled 2023-02-06: qty 2

## 2023-02-06 MED ORDER — CEFAZOLIN SODIUM-DEXTROSE 2-4 GM/100ML-% IV SOLN
INTRAVENOUS | Status: AC
  Filled 2023-02-06: qty 100

## 2023-02-06 MED ORDER — LIDOCAINE HCL (PF) 1 % IJ SOLN
INTRAMUSCULAR | Status: AC
  Filled 2023-02-06: qty 60

## 2023-02-06 MED ORDER — SODIUM CHLORIDE 0.9 % IV SOLN
INTRAVENOUS | Status: AC
  Filled 2023-02-06: qty 2

## 2023-02-06 MED ORDER — FENTANYL CITRATE (PF) 100 MCG/2ML IJ SOLN
INTRAMUSCULAR | Status: AC
  Filled 2023-02-06: qty 2

## 2023-02-06 MED ORDER — CEFAZOLIN IN SODIUM CHLORIDE 3-0.9 GM/100ML-% IV SOLN
3.0000 g | INTRAVENOUS | Status: AC
  Administered 2023-02-06: 3 g via INTRAVENOUS
  Filled 2023-02-06 (×2): qty 100

## 2023-02-06 MED ORDER — MIDAZOLAM HCL 2 MG/2ML IJ SOLN
INTRAMUSCULAR | Status: AC
  Filled 2023-02-06: qty 2

## 2023-02-06 MED ORDER — HEPARIN (PORCINE) IN NACL 1000-0.9 UT/500ML-% IV SOLN
INTRAVENOUS | Status: DC | PRN
  Administered 2023-02-06: 500 mL

## 2023-02-06 MED ORDER — MIDAZOLAM HCL 5 MG/5ML IJ SOLN
INTRAMUSCULAR | Status: DC | PRN
  Administered 2023-02-06: 1 mg via INTRAVENOUS
  Administered 2023-02-06: 2 mg via INTRAVENOUS

## 2023-02-06 MED ORDER — CEFAZOLIN SODIUM-DEXTROSE 1-4 GM/50ML-% IV SOLN
1.0000 g | Freq: Four times a day (QID) | INTRAVENOUS | Status: DC
  Administered 2023-02-06 – 2023-02-07 (×2): 1 g via INTRAVENOUS
  Filled 2023-02-06 (×4): qty 50

## 2023-02-06 MED ORDER — MIDAZOLAM HCL 2 MG/2ML IJ SOLN
INTRAMUSCULAR | Status: AC
Start: 2023-02-06 — End: ?
  Filled 2023-02-06: qty 2

## 2023-02-06 MED ORDER — CHLORHEXIDINE GLUCONATE 4 % EX SOLN
60.0000 mL | Freq: Once | CUTANEOUS | Status: AC
  Administered 2023-02-06: 4 via TOPICAL

## 2023-02-06 MED ORDER — LORAZEPAM 1 MG PO TABS
1.0000 mg | ORAL_TABLET | ORAL | Status: DC | PRN
  Administered 2023-02-06 – 2023-02-07 (×3): 1 mg via ORAL
  Filled 2023-02-06 (×3): qty 1

## 2023-02-06 SURGICAL SUPPLY — 6 items
CABLE SURGICAL S-101-97-12 (CABLE) ×1 IMPLANT
ELECT DEFIB PAD ADLT CADENCE (PAD) IMPLANT
ICD MOMENTUM D120 (ICD Generator) IMPLANT
LEAD RELIANCE 0138-64 (Lead) IMPLANT
SHEATH 9FR PRELUDE SNAP 13 (SHEATH) IMPLANT
TRAY PACEMAKER INSERTION (PACKS) ×1 IMPLANT

## 2023-02-06 NOTE — H&P (View-Only) (Signed)
Patient Name: Michael Bell Date of Encounter: 02/06/2023  Primary Cardiologist: Tessa Lerner, DO Electrophysiologist: None  Interval Summary   The patient is doing well today.  At this time, the patient denies chest pain, shortness of breath, or any new concerns.  Vital Signs    Vitals:   02/06/23 0500 02/06/23 0600 02/06/23 0700 02/06/23 0800  BP: 129/85 (!) 133/95    Pulse: 65 71 74   Resp: (!) 23 (!) 26 (!) 21 17  Temp:   99.3 F (37.4 C)   TempSrc:      SpO2: 95% 93% 98% 98%  Weight:      Height:        Intake/Output Summary (Last 24 hours) at 02/06/2023 0840 Last data filed at 02/06/2023 0800 Gross per 24 hour  Intake 491.62 ml  Output 500 ml  Net -8.38 ml   Filed Weights   02/04/23 0738  Weight: 124.7 kg    Physical Exam    GEN- The patient is well appearing, alert and oriented x 3 today.   Lungs- Clear to ausculation bilaterally, normal work of breathing Cardiac- Regular rate and rhythm, no murmurs, rubs or gallops GI- soft, NT, ND, + BS Extremities- no clubbing or cyanosis. No edema  Telemetry    NSR 60-70s (personally reviewed)  Hospital Course    Michael Bell is a 62 y.o. 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  He was brought to the cardiac Cath Lab and left heart catheterization was performed.  This did not show any acute process and was overall unchanged from his last cardiac catheterization in August 2024.  Review of ECG strips by EMS in the media tab of his medical chart appear to show polymorphic VT.   Assessment & Plan    Out-of-hospital cardiac arrest: Polymorphic VT PVCs Given that there is no obvious ischemic etiology that is reversible, patient meets criteria for  secondary prevention ICD.   Continue amiodarone  Explained risks, benefits, and alternatives to ICD implantation, including but not limited to bleeding, infection, pneumothorax, pericardial effusion, lead dislodgement, heart attack, stroke, or death.  Pt verbalized understanding and agrees to proceed.   Acute on chronic systolic heart failure:  LVEF now less than 20%, previously 35 to 40%. Continue metoprolol XL 50 mg once daily and uptitrate as able. Continue to [place]. Start ACE/ARB/ARNI when creatinine normalizes. Restart home spironolactone once creatinine normalizes. Stop amlodipine to leave room for GDMT   AKI Likely pre-renal in setting of arrest.  Continue to monitor. Start ACE/ARB/ARNI when creatinine normalizes. Restart home spironolactone once creatinine normalizes.   CAD, Non-obstructive No acute process on cath 50% mid LCx disease and 80-90% RCA disease. Continue aspirin and statin   Atrial fibrillation Hold heparin for implant.  CHA2DS2/VASc is 3. Will plan to resume Eliquis s/p ICD.   For questions or updates, please contact [facility] Please consult [email or web address] for contact info under Cardiology/STEMI.  Signed, Graciella Freer, PA-C  02/06/2023, 8:40 AM   EP Attending  Patient seen and examined. Agree with above. The patient presents with a VT arrest and no change in his non-obstructive CAD. There are no reversible causes. I have discussed the treatment options with the patient and recommended proceeding with ICD insertion. He has  known atrial fib and his eliquis has been held.Sharlot Gowda Sofiya Ezelle,MD

## 2023-02-06 NOTE — Progress Notes (Addendum)
Patient Name: Michael Bell Date of Encounter: 02/06/2023  Primary Cardiologist: Tessa Lerner, DO Electrophysiologist: None  Interval Summary   The patient is doing well today.  At this time, the patient denies chest pain, shortness of breath, or any new concerns.  Vital Signs    Vitals:   02/06/23 0500 02/06/23 0600 02/06/23 0700 02/06/23 0800  BP: 129/85 (!) 133/95    Pulse: 65 71 74   Resp: (!) 23 (!) 26 (!) 21 17  Temp:   99.3 F (37.4 C)   TempSrc:      SpO2: 95% 93% 98% 98%  Weight:      Height:        Intake/Output Summary (Last 24 hours) at 02/06/2023 0840 Last data filed at 02/06/2023 0800 Gross per 24 hour  Intake 491.62 ml  Output 500 ml  Net -8.38 ml   Filed Weights   02/04/23 0738  Weight: 124.7 kg    Physical Exam    GEN- The patient is well appearing, alert and oriented x 3 today.   Lungs- Clear to ausculation bilaterally, normal work of breathing Cardiac- Regular rate and rhythm, no murmurs, rubs or gallops GI- soft, NT, ND, + BS Extremities- no clubbing or cyanosis. No edema  Telemetry    NSR 60-70s (personally reviewed)  Hospital Course    [name] is a 62 y.o. 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  He was brought to the cardiac Cath Lab and left heart catheterization was performed.  This did not show any acute process and was overall unchanged from his last cardiac catheterization in August 2024.  Review of ECG strips by EMS in the media tab of his medical chart appear to show polymorphic VT.   Assessment & Plan    Out-of-hospital cardiac arrest: Polymorphic VT PVCs Given that there is no obvious ischemic etiology that is reversible, patient meets criteria for  secondary prevention ICD.   Continue amiodarone  Explained risks, benefits, and alternatives to ICD implantation, including but not limited to bleeding, infection, pneumothorax, pericardial effusion, lead dislodgement, heart attack, stroke, or death.  Pt verbalized understanding and agrees to proceed.   Acute on chronic systolic heart failure:  LVEF now less than 20%, previously 35 to 40%. Continue metoprolol XL 50 mg once daily and uptitrate as able. Continue to [place]. Start ACE/ARB/ARNI when creatinine normalizes. Restart home spironolactone once creatinine normalizes. Stop amlodipine to leave room for GDMT   AKI Likely pre-renal in setting of arrest.  Continue to monitor. Start ACE/ARB/ARNI when creatinine normalizes. Restart home spironolactone once creatinine normalizes.   CAD, Non-obstructive No acute process on cath 50% mid LCx disease and 80-90% RCA disease. Continue aspirin and statin   Atrial fibrillation Hold heparin for implant.  CHA2DS2/VASc is 3. Will plan to resume Eliquis s/p ICD.   For questions or updates, please contact [facility] Please consult [email or web address] for contact info under Cardiology/STEMI.  Signed, Graciella Freer, PA-C  02/06/2023, 8:40 AM   EP Attending  Patient seen and examined. Agree with above. The patient presents with a VT arrest and no change in his non-obstructive CAD. There are no reversible causes. I have discussed the treatment options with the patient and recommended proceeding with ICD insertion. He has  known atrial fib and his eliquis has been held.Sharlot Gowda Sofiya Ezelle,MD

## 2023-02-06 NOTE — Progress Notes (Signed)
   Heart Failure Stewardship Pharmacist Progress Note   PCP: Marrianne Mood, MD PCP-Cardiologist: Tessa Lerner, DO    HPI:  62 yo M with PMH of HFrEF, HTN, coronary and aortic atherosclerosis, COPD, tobacco use, alcohol use, afib, and intrarenal aortic aneurysm 3.7 cm.  Presented to the ED on 11/9 with chest pain and diaphoresis. When with EMS, he began seizing and went into afib followed by vfib arrest. Shocked 3 times and given 450 mg of amiodarone and 3 epinephrine per RN report. Post arrest EKG with sinus rhythm, lateral ST depressions. CXR with stable cardiomegaly and mild central pulmonary vascular congestion and possible bilateral pulmonary edema.   Taken for [facility] on 11/9 and 50% stenosis of mid LAD and diffuse 80-90% stenosis of mid RCA (coronary anatomy unchanged from 09/2022). Vfib likely secondary to NICM. ECHO on 11/9 showed EF <20% (was 35-40% in 09/2022), global hypokinesis, mild concentric LVH, G2DD, RV severely reduced.   S/p ICD implant on 11/11.   Patient requesting to leave AMA on 11/12 but is willing to stay for teaching and primary team.   Current HF Medications: Beta Blocker: metoprolol XL 50 mg daily SGLT2i: Farxiga 10 mg daily  Prior to admission HF Medications: Diuretic: furosemide 40 mg daily Beta blocker: metoprolol XL 50 mg daily MRA: spironolactone 12.5 mg daily SGLT2i: Farxiga 10 mg daily  Pertinent Lab Values: Serum creatinine 1.90, BUN 32, Potassium 4.3, Sodium 133, Magnesium 2.0   Vital Signs: Weight: 275 lbs Blood pressure: 160/90s  Heart rate: 70-80s  I/O: net +0.2L yesterday; net -0.8L since admission  Medication Assistance / Insurance Benefits Check: Does the patient have prescription insurance?  Yes Type of insurance plan: [place] Medicaid  Outpatient Pharmacy:  Prior to admission outpatient pharmacy: [facility] OP Is the patient willing to use [facility] TOC pharmacy at discharge? Yes    Assessment: 1. Systolic CHF (LVEF <20%), due to NICM. NYHA class  II symptoms. - Baseline creatinine ~1.4, up to 1.9 today. Has follow up on 11/18 with [facility]. Recommend repeating labs then to restart spironolactone.  -  Has required one dose of IV lasix this admission. Recommend to restart furosemide 40 mg daily at discharge with plans to repeat BMET on 11/18. Seems like he has some extra fluid today with shortness of breath on exertion.  - Continue metoprolol XL 50 mg daily and Farxiga 10 mg daily - BP elevated - consider starting BiDil 20/37.5 mg 1 tablet TID until creatinine normalizes and can start Entresto and resume spironolactone.    Plan: 1) Medication changes recommended at this time: - Restart furosemide 40 mg daily at discharge - Add BiDil 20/37.5 mg 1 tablet PO TID - Restart spironolactone at follow up - Add Entresto 24/26 mg BID at follow up  2) Patient assistance: Sherryll Burger copay $4 - BiDil copay $4 - Marcelline Deist copay $4  3)  Education  - Patient has been educated on current HF medications and potential additions to HF medication regimen - Patient verbalizes understanding that over the next few months, these medication doses may change and more medications may be added to optimize HF regimen - Patient was falling asleep during education today. Would benefit from continued education as an outpatient.    Sharen Hones, PharmD, BCPS Heart Failure Stewardship Pharmacist Phone [phone]

## 2023-02-06 NOTE — Progress Notes (Signed)
PHARMACY - ANTICOAGULATION CONSULT NOTE  Pharmacy Consult for heparin Indication: atrial fibrillation  No Known Allergies  Patient Measurements: Height: 6\' 2"  (188 cm) Weight: 124.7 kg (275 lb) IBW/kg (Calculated) : 82.2 Heparin Dosing Weight: 109.3 kg   Vital Signs: Temp: 99.8 F (37.7 C) (11/10 2300) Temp Source: Oral (11/10 2300) BP: 129/85 (11/11 0500) Pulse Rate: 65 (11/11 0500)  Labs: Recent Labs    02/04/23 0704 02/04/23 1134 02/04/23 2128 02/04/23 2128 02/05/23 0249 02/05/23 1119 02/05/23 1924 02/06/23 0412  HGB 12.8* 13.7  --   --   --   --   --  12.4*  HCT 41.8 43.5  --   --   --   --   --  38.4*  PLT 202 191  --   --   --   --   --  152  APTT  --   --  49*  --  55* 48*  --   --   HEPARINUNFRC  --   --  0.25*   < > 0.27* 0.24* 0.34 0.29*  CREATININE 1.63* 1.68*  --   --  1.91*  --   --  1.90*  TROPONINIHS 30* 263*  --   --   --   --   --   --    < > = values in this interval not displayed.    Estimated Creatinine Clearance: 56.6 mL/min (A) (by C-G formula based on SCr of 1.9 mg/dL (H)).   Medical History: Past Medical History:  Diagnosis Date   Acute diastolic heart failure (HCC)    Arthritis    CHF (congestive heart failure) (HCC)    Headache    Hypertension    Hypertensive urgency     Assessment: 62 yom presenting after Vfib arrest. Underwent cardiac cath finding unchanged coronary disease compared to 10/20/2022. On apixaban PTA for hx Afib (LD 11/8 AM). Patient now being evaluated for possible ICD given Vfib arrest so apixaban on hold. CBC stable with Hgb 13.7, plt 191. No s/sx of bleeding.   02/06/23 AM:  8 hour heparin level is subtherapeutic at 0.29 on 1800 units/hour. Per RN, no issues wit the heparin running continuously or signs/symptoms of bleeding. Hgb 13.7>12.4 and plts 191>152.  Goal of Therapy:  Heparin level 0.3-0.7 units/ml Monitor platelets by anticoagulation protocol: Yes   Plan:  Increase Heparin infusion to 1900  units/hr 8h heparin level Monitor daily HL, CBC, and for s/sx fo bleeding   Thank you for allowing pharmacy to participate in this patient's care,  Arabella Merles, PharmD. Clinical Pharmacist 02/06/2023 6:13 AM

## 2023-02-06 NOTE — Interval H&P Note (Signed)
History and Physical Interval Note:  02/06/2023 11:07 AM  Michael Bell  has presented today for surgery, with the diagnosis of cardiac arrest.  The various methods of treatment have been discussed with the patient and family. After consideration of risks, benefits and other options for treatment, the patient has consented to  Procedure(s): ICD IMPLANT (N/A) as a surgical intervention.  The patient's history has been reviewed, patient examined, no change in status, stable for surgery.  I have reviewed the patient's chart and labs.  Questions were answered to the patient's satisfaction.     Lewayne Bunting

## 2023-02-06 NOTE — Discharge Instructions (Signed)
After Your ICD (Implantable Cardiac Defibrillator)   You have a Environmental manager ICD  ACTIVITY Do not lift your arm above shoulder height for 1 week after your procedure. After 7 days, you may progress as below.  You should remove your sling 24 hours after your procedure, unless otherwise instructed by your provider.     Monday February 13, 2023  Tuesday February 14, 2023 Wednesday February 15, 2023 Thursday February 16, 2023   Do not lift, push, pull, or carry anything over 10 pounds with the affected arm until 6 weeks (Monday March 20, 2023 ) after your procedure.   You may drive AFTER your wound check, unless you have been told otherwise by your provider.   Ask your healthcare provider when you can go back to work   INCISION/Dressing If you are on a blood thinner such as Coumadin, Xarelto, Eliquis, Plavix, or Pradaxa please confirm with your provider when this should be resumed.   If large square, outer bandage is left in place, this can be removed after 24 hours from your procedure. Do not remove steri-strips or glue as below.   Monitor your defibrillator site for redness, swelling, and drainage. Call the device clinic at [phone] if you experience these symptoms or fever/chills.  If your incision is sealed with Steri-strips or staples, you may shower 7 days after your procedure or when told by your provider. Do not remove the steri-strips or let the shower hit directly on your site. You may wash around your site with soap and water.    If you were discharged in a sling, please do not wear this during the day more than 48 hours after your surgery unless otherwise instructed. This may increase the risk of stiffness and soreness in your shoulder.   Avoid lotions, ointments, or perfumes over your incision until it is well-healed.  You may use a hot tub or a pool AFTER your wound check appointment if the incision is completely closed.  Your ICD is designed to protect you  from life threatening heart rhythms. Because of this, you may receive a shock.   1 shock with no symptoms:  Call the office during business hours. 1 shock with symptoms (chest pain, chest pressure, dizziness, lightheadedness, shortness of breath, overall feeling unwell):  Call 911. If you experience 2 or more shocks in 24 hours:  Call 911. If you receive a shock, you should not drive for 6 months per the [place] DMV IF you receive appropriate therapy from your ICD.   ICD Alerts:  Some alerts are vibratory and others beep. These are NOT emergencies. Please call our office to let us know. If this occurs at night or on weekends, it can wait until the next business day. Send a remote transmission.  If your device is capable of reading fluid status (for heart failure), you will be offered monthly monitoring to review this with you.   DEVICE MANAGEMENT Remote monitoring is used to monitor your ICD from home. This monitoring is scheduled every 91 days by our office. It allows [place] to keep an eye on the functioning of your device to ensure it is working properly. You will routinely see your Electrophysiologist annually (more often if necessary).   You should receive your ID card for your new device in 4-8 weeks. Keep this card with you at all times once received. Consider wearing a medical alert bracelet or necklace.  Your ICD  may be MRI compatible. This will be discussed at your  next office visit/wound check.  You should avoid contact with strong electric or magnetic fields.   Do not use amateur (ham) radio equipment or electric (arc) welding torches. MP3 player headphones with magnets should not be used. Some devices are safe to use if held at least 12 inches (30 cm) from your defibrillator. These include power tools, lawn mowers, and speakers. If you are unsure if something is safe to use, ask your health care provider.  When using your cell phone, hold it to the ear that is on the opposite side from the  defibrillator. Do not leave your cell phone in a pocket over the defibrillator.  You may safely use electric blankets, heating pads, computers, and microwave ovens.  Call the office right away if: You have chest pain. You feel more than one shock. You feel more short of breath than you have felt before. You feel more light-headed than you have felt before. Your incision starts to open up.  This information is not intended to replace advice given to you by your health care provider. Make sure you discuss any questions you have with your health care provider.

## 2023-02-07 ENCOUNTER — Other Ambulatory Visit (HOSPITAL_BASED_OUTPATIENT_CLINIC_OR_DEPARTMENT_OTHER): Payer: Self-pay

## 2023-02-07 ENCOUNTER — Encounter (HOSPITAL_COMMUNITY): Payer: Self-pay | Admitting: Internal Medicine

## 2023-02-07 ENCOUNTER — Other Ambulatory Visit (HOSPITAL_COMMUNITY): Payer: Self-pay

## 2023-02-07 ENCOUNTER — Inpatient Hospital Stay (HOSPITAL_COMMUNITY): Payer: Medicaid Other

## 2023-02-07 DIAGNOSIS — I428 Other cardiomyopathies: Secondary | ICD-10-CM

## 2023-02-07 DIAGNOSIS — N170 Acute kidney failure with tubular necrosis: Secondary | ICD-10-CM

## 2023-02-07 DIAGNOSIS — I10 Essential (primary) hypertension: Secondary | ICD-10-CM

## 2023-02-07 DIAGNOSIS — I5023 Acute on chronic systolic (congestive) heart failure: Secondary | ICD-10-CM

## 2023-02-07 DIAGNOSIS — N179 Acute kidney failure, unspecified: Secondary | ICD-10-CM | POA: Insufficient documentation

## 2023-02-07 DIAGNOSIS — Z95810 Presence of automatic (implantable) cardiac defibrillator: Secondary | ICD-10-CM

## 2023-02-07 DIAGNOSIS — I4901 Ventricular fibrillation: Secondary | ICD-10-CM | POA: Diagnosis not present

## 2023-02-07 DIAGNOSIS — I480 Paroxysmal atrial fibrillation: Secondary | ICD-10-CM | POA: Insufficient documentation

## 2023-02-07 LAB — CBC
HCT: 38 % — ABNORMAL LOW (ref 39.0–52.0)
Hemoglobin: 12.2 g/dL — ABNORMAL LOW (ref 13.0–17.0)
MCH: 27.4 pg (ref 26.0–34.0)
MCHC: 32.1 g/dL (ref 30.0–36.0)
MCV: 85.4 fL (ref 80.0–100.0)
Platelets: 174 10*3/uL (ref 150–400)
RBC: 4.45 MIL/uL (ref 4.22–5.81)
RDW: 15.7 % — ABNORMAL HIGH (ref 11.5–15.5)
WBC: 10.7 10*3/uL — ABNORMAL HIGH (ref 4.0–10.5)
nRBC: 0 % (ref 0.0–0.2)

## 2023-02-07 LAB — BASIC METABOLIC PANEL
Anion gap: 9 (ref 5–15)
BUN: 28 mg/dL — ABNORMAL HIGH (ref 8–23)
CO2: 20 mmol/L — ABNORMAL LOW (ref 22–32)
Calcium: 8.4 mg/dL — ABNORMAL LOW (ref 8.9–10.3)
Chloride: 105 mmol/L (ref 98–111)
Creatinine, Ser: 1.64 mg/dL — ABNORMAL HIGH (ref 0.61–1.24)
GFR, Estimated: 47 mL/min — ABNORMAL LOW (ref >60)
Glucose, Bld: 129 mg/dL — ABNORMAL HIGH (ref 70–99)
Potassium: 4 mmol/L (ref 3.5–5.1)
Sodium: 134 mmol/L — ABNORMAL LOW (ref 135–145)

## 2023-02-07 LAB — MAGNESIUM: Magnesium: 1.9 mg/dL (ref 1.7–2.4)

## 2023-02-07 MED ORDER — HYDRALAZINE HCL 20 MG/ML IJ SOLN
10.0000 mg | INTRAMUSCULAR | Status: DC | PRN
  Administered 2023-02-07 (×2): 10 mg via INTRAVENOUS
  Filled 2023-02-07 (×2): qty 1

## 2023-02-07 MED ORDER — RANOLAZINE ER 500 MG PO TB12
500.0000 mg | ORAL_TABLET | Freq: Two times a day (BID) | ORAL | 1 refills | Status: DC
  Filled 2023-02-07 (×2): qty 60, 30d supply, fill #0
  Filled 2023-03-18: qty 60, 30d supply, fill #1

## 2023-02-07 MED ORDER — METOPROLOL SUCCINATE ER 50 MG PO TB24
50.0000 mg | ORAL_TABLET | Freq: Every day | ORAL | 1 refills | Status: DC
  Filled 2023-02-07 (×2): qty 30, 30d supply, fill #0
  Filled 2023-03-04: qty 30, 30d supply, fill #1

## 2023-02-07 MED ORDER — ATORVASTATIN CALCIUM 80 MG PO TABS
80.0000 mg | ORAL_TABLET | Freq: Every day | ORAL | 1 refills | Status: DC
  Filled 2023-02-07 (×2): qty 30, 30d supply, fill #0
  Filled 2023-03-04: qty 30, 30d supply, fill #1

## 2023-02-07 MED ORDER — DAPAGLIFLOZIN PROPANEDIOL 10 MG PO TABS
10.0000 mg | ORAL_TABLET | Freq: Every day | ORAL | 1 refills | Status: DC
  Filled 2023-02-07 (×2): qty 30, 30d supply, fill #0
  Filled 2023-03-04: qty 30, 30d supply, fill #1

## 2023-02-07 MED ORDER — ISOSORB DINITRATE-HYDRALAZINE 20-37.5 MG PO TABS
1.0000 | ORAL_TABLET | Freq: Three times a day (TID) | ORAL | 2 refills | Status: AC
  Filled 2023-02-07 (×2): qty 90, 30d supply, fill #0
  Filled 2023-03-04: qty 90, 30d supply, fill #1
  Filled 2023-03-29: qty 90, 30d supply, fill #2

## 2023-02-07 MED ORDER — FUROSEMIDE 40 MG PO TABS
40.0000 mg | ORAL_TABLET | Freq: Every day | ORAL | 1 refills | Status: DC
  Filled 2023-02-07 (×2): qty 30, 30d supply, fill #0
  Filled 2023-03-04: qty 30, 30d supply, fill #1

## 2023-02-07 MED ORDER — APIXABAN 5 MG PO TABS: 5.0000 mg | ORAL_TABLET | Freq: Two times a day (BID) | ORAL | Status: DC

## 2023-02-07 MED ORDER — ASPIRIN 81 MG PO TBEC
81.0000 mg | DELAYED_RELEASE_TABLET | Freq: Every day | ORAL | 1 refills | Status: DC
  Filled 2023-02-07 (×2): qty 90, 90d supply, fill #0
  Filled 2023-03-04 – 2023-05-25 (×2): qty 90, 90d supply, fill #1

## 2023-02-07 MED ORDER — AMIODARONE HCL 200 MG PO TABS
200.0000 mg | ORAL_TABLET | Freq: Two times a day (BID) | ORAL | 1 refills | Status: DC
  Filled 2023-02-07 (×2): qty 60, 30d supply, fill #0

## 2023-02-07 MED ORDER — ISOSORBIDE MONONITRATE ER 60 MG PO TB24
60.0000 mg | ORAL_TABLET | Freq: Every day | ORAL | 1 refills | Status: DC
  Filled 2023-02-07: qty 30, 30d supply, fill #0

## 2023-02-07 NOTE — Progress Notes (Signed)
Patient Name: Michael Bell Date of Encounter: 02/07/2023 [facility] Cardiologist: Tessa Lerner, DO   Interval Summary  .    ICD placed yesterday.  Doing relatively well today.  Does wake up with some chest tightness and dyspnea but otherwise has been okay walking around.  Vital Signs .    Vitals:   02/07/23 0556 02/07/23 0700 02/07/23 0800 02/07/23 0915  BP: (!) 162/84     Pulse:  77    Resp: 14 17 17    Temp:    97.8 F (36.6 C)  TempSrc:    Oral  SpO2:  92%    Weight:      Height:        Intake/Output Summary (Last 24 hours) at 02/07/2023 0957 Last data filed at 02/07/2023 0600 Gross per 24 hour  Intake 535.25 ml  Output 250 ml  Net 285.25 ml      02/04/2023    7:38 AM 11/17/2022   10:31 AM 11/11/2022   10:34 AM  Last 3 Weights  Weight (lbs) 275 lb 255 lb 12.8 oz 249 lb 9.6 oz  Weight (kg) 124.739 kg 116.03 kg 113.218 kg      Telemetry/ECG    Sinus rhythm with PVCs when on telemetry.- Personally Reviewed  Inpatient procedures include cardiac catheterization 02/04/2023 and echo 02/04/2023. Echo 02/04/2023 with EF<20% global HK.  GR 2 DD.  Mildly dilated LV.  Also severely dilated RV with severely reduced function.  RVSP estimated 41 mL mercury.  Biatrial severe dilation.  No significant valvular disease.  Elevated RAP of 15 mmHg. Cardiac cath showed mid LCx for percent stenosis followed by very distal occlusion unchanged from May 2023.  Small nondominant RCA with diffuse 89% mid vessel disease.  No change. ICD implant 02/06/2023  Physical Exam .   GEN: No acute distress.   Neck: No JVD Cardiac: RRR,Distant heart sounds with normal S1 and S2.  Cannot exclude S4.   no murmurs, or rubs,  Respiratory: Clear to auscultation bilaterally. GI: Soft, nontender, non-distended  MS: No edema  Assessment & Plan .     Out-of-hospital cardiac arrest: Polymorphic VT / PVCs => ICD placed yesterday, Stable device check this am Stable CXR Wound care and arm  restrictions reviewed with pt and placed in AVS.  Per EP: Continue amiodarone 200 mg BID until follow up.    Acute on chronic systolic heart failure:  LVEF now less than 20%, previously 35 to 40%. Already on Toprol-XL 50 mg-will hold off on titrating further until off of amiodarone Continue Farxiga Was on spironolactone prior to admission, but with renal function still not quite to baseline, will hold off and restart in the outpatient setting. Will start low-dose Lasix 40 mg p.o. daily Will also start BiDil-with for now on discharge with plans to convert to [facility] in the outpatient setting.   AKI Likely pre-renal in setting of arrest.  Improved creatinine today.  Will need outpatient labs checked prior to [facility] follow-up Holding spironolactone and ARNI until renal function back to baseline creatinine of 1.3.  Can be restarted in the outpatient setting as renal function stabilizes Using BiDil pending improvement in renal function in which we can switch to ARNI.   CAD, Non-obstructive: No acute process on cath 50% mid LCx disease and 80-90% RCA disease. Continue aspirin, statin and beta-blocker.   Paroxysmal atrial fibrillation: CHA2DS2/VASc is 3 Will plan to resume Eliquis s/p ICD Per EP: Can resume Eliquis on SUNDAY, 11/17   Dispo:  Plan for discharge today will need TOC follow-up for heart failure management either in [facility] office or heart failure TOC clinic.  Will need to have chemistry panel checked at the end of the week to reassess renal function. Will also need to have EP set up well check/patient check.    For questions or updates, please contact [facility] Please consult [email or web address] for contact info under        Signed, Bryan Lemma, MD

## 2023-02-07 NOTE — Discharge Summary (Addendum)
Discharge Summary    Patient ID: Michael Bell MRN: [id number]; DOB: [birth year]  Admit date: 02/04/2023 Discharge date: 02/07/2023  PCP:  Marrianne Mood, MD    [facility] Providers Cardiologist:  Tessa Lerner, DO  Electrophysiologist:  Lewayne Bunting, MD    Discharge Diagnoses    Principal Problem:   Ventricular fibrillation [facility]) Active Problems:   HTN, goal below 130/80   HFrEF (heart failure with reduced ejection fraction) (HCC)   NICM (nonischemic cardiomyopathy) (HCC)   AKI (acute kidney injury) (HCC)   Paroxysmal atrial fibrillation (HCC)  Acute on chronic systolic heart failure:  LVEF now less than 20%, previously 35 to 40%.   Diagnostic Studies/Procedures    Echo: 02/04/2023  IMPRESSIONS     1. Left ventricular ejection fraction, by estimation, is <20%. The left  ventricle has severely decreased function. The left ventricle demonstrates  global hypokinesis. The left ventricular internal cavity size was mildly  dilated. There is mild concentric   left ventricular hypertrophy. Left ventricular diastolic parameters are  consistent with Grade II diastolic dysfunction (pseudonormalization).  Elevated left atrial pressure.   2. Right ventricular systolic function is severely reduced. The right  ventricular size is severely enlarged. The estimated right ventricular  systolic pressure is 41.2 mmHg.   3. Left atrial size was severely dilated.   4. Right atrial size was severely dilated.   5. The mitral valve is normal in structure. No evidence of mitral valve  regurgitation.   6. Tricuspid valve regurgitation is mild to moderate.   7. The aortic valve is tricuspid. There is mild thickening of the aortic  valve. Aortic valve regurgitation is not visualized. No aortic stenosis is  present.   8. The inferior vena cava is dilated in size with <50% respiratory  variability, suggesting right atrial pressure of 15 mmHg.   Comparison(s): Prior images reviewed  side by side. The left ventricular  function is significantly worse.   FINDINGS   Left Ventricle: Pulsus alternans is seen by LVOT pulsed Doppler, a sign  of very low cardiac output and poor prognosis. Left ventricular ejection  fraction, by estimation, is <20%. The left ventricle has severely  decreased function. The left ventricle  demonstrates global hypokinesis. Definity contrast agent was given IV to  delineate the left ventricular endocardial borders. The left ventricular  internal cavity size was mildly dilated. There is mild concentric left  ventricular hypertrophy. Left  ventricular diastolic parameters are consistent with Grade II diastolic  dysfunction (pseudonormalization). Elevated left atrial pressure.   Right Ventricle: The right ventricular size is severely enlarged. No  increase in right ventricular wall thickness. Right ventricular systolic  function is severely reduced. The tricuspid regurgitant velocity is 2.56  m/s, and with an assumed right atrial  pressure of 15 mmHg, the estimated right ventricular systolic pressure is  41.2 mmHg.   Left Atrium: Left atrial size was severely dilated.   Right Atrium: Right atrial size was severely dilated.   Pericardium: There is no evidence of pericardial effusion.   Mitral Valve: The mitral valve is normal in structure. No evidence of  mitral valve regurgitation.   Tricuspid Valve: The tricuspid valve is normal in structure. Tricuspid  valve regurgitation is mild to moderate.   Aortic Valve: The aortic valve is tricuspid. There is mild thickening of  the aortic valve. Aortic valve regurgitation is not visualized. No aortic  stenosis is present. Aortic valve mean gradient measures 3.0 mmHg. Aortic  valve peak gradient measures 4.7  mmHg. Aortic valve area, by VTI measures 3.22 cm.   Pulmonic Valve: The pulmonic valve was normal in structure. Pulmonic valve  regurgitation is trivial.   Aorta: The aortic root is  normal in size and structure.   Venous: The inferior vena cava is dilated in size with less than 50%  respiratory variability, suggesting right atrial pressure of 15 mmHg.   IAS/Shunts: No atrial level shunt detected by color flow Doppler.   _____________   History of Present Illness     Michael Bell is a 62 y.o. 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   Patient was at home the morning of admission when he started having chest pain and diaphoresis. He called EMS. Reportedly by EMS, he went into Afib, followed by Vfib arrest requiring CPR and 3 shocks (strips not available for my review), ?epi, and brief placement of King's airway, which was since removed. There was reportedly some ?seizures before the cardiac arrest. IO access was obtained in the field. He was startedon IV amiodarone. Patient awake, alert, talking, complaining of chest pain as well as right shoulder pain, along with shortness of breath, since CPR. Post arrest EKG shows sinus rhyth, with lateral ST depressions which are new since previous EKGs prior to this admission.   Cath in 10/2022 showed distal Lcx occlusion, which was new compared to previous cath.  He was admitted to cardiology for further management.  Hospital Course     Consultants: EP   Out-of-hospital cardiac arrest Polymorphic VT / PVCs -- There was no obvious reversible ischemic etiology and he was felt to meet criteria for secondary prevention ICD -- He underwent ICD placement with Dr. Ladona Ridgel on 11/11 -- Stable CXR -- Wound care and arm restrictions reviewed with pt and placed in AVS per EP -- Amiodarone will be continued at 200 mg twice daily until follow-up in the office   Acute on chronic systolic heart failure: -- LVEF now less than 20%, previously 35 to 40%. -- GDMT: continue Toprol-XL 50  mg-will hold off on titrating further until off of amiodarone, continue Farxiga, started Bi-Dil TID at discharge but consider transitioning to [facility] in the outpatient setting -- Was on spironolactone prior to admission, but with renal function still not quite to baseline, will hold off and restart in the outpatient setting. -- resume low-dose Lasix 40 mg p.o. daily  AKI -- Likely pre-renal in setting of arrest.  Improved creatinine to 1.9 at discharge   Will need outpatient labs checked prior to [facility] follow-up -- Holding spironolactone and ARNI until renal function back to baseline creatinine of 1.3.  Consider restarting in the outpatient setting as renal function stabilizes -- Using BiDil pending improvement in renal function in which we can switch to ARNI.   CAD, Non-obstructive -- cardiac cath 11/9 with 50% mid LCx disease and 80-90% RCA disease, no culprit lesion -- Continue aspirin, statin and beta-blocker.   Paroxysmal atrial fibrillation -- CHA2DS2/VASc is 3 -- Will plan to resume Eliquis s/p ICD on Sunday 11/17   Patient was seen by Dr. Herbie Baltimore and deemed stable for discharge. Follow up arranged in the office. Medications sent to patients pharmacy of choice. Educated by pharmD prior to discharge.  _____________  Discharge Vitals Blood pressure (!) 162/84, pulse 77, temperature 97.8 F (36.6 C), temperature source Oral, resp. rate 17, height 6\' 2"  (1.88 m),  weight 124.7 kg, SpO2 92%.  Filed Weights   02/04/23 0738  Weight: 124.7 kg    Labs & Radiologic Studies    CBC Recent Labs    02/06/23 0412 02/07/23 0750  WBC 12.9* 10.7*  HGB 12.4* 12.2*  HCT 38.4* 38.0*  MCV 86.3 85.4  PLT 152 174   Basic Metabolic Panel Recent Labs    16/10/96 0412 02/07/23 0750  NA 133* 134*  K 4.3 4.0  CL 104 105  CO2 21* 20*  GLUCOSE 99 129*  BUN 32* 28*  CREATININE 1.90* 1.64*  CALCIUM 8.6* 8.4*  MG 2.0 1.9   Liver Function Tests No results for input(s): "AST", "ALT",  "ALKPHOS", "BILITOT", "PROT", "ALBUMIN" in the last 72 hours. No results for input(s): "LIPASE", "AMYLASE" in the last 72 hours. High Sensitivity Troponin:   Recent Labs  Lab 02/04/23 0704 02/04/23 1134  TROPONINIHS 30* 263*    BNP Invalid input(s): "POCBNP" D-Dimer No results for input(s): "DDIMER" in the last 72 hours. Hemoglobin A1C No results for input(s): "HGBA1C" in the last 72 hours. Fasting Lipid Panel Recent Labs    02/05/23 0249  CHOL 121  HDL 43  LDLCALC 71  TRIG 34  CHOLHDL 2.8   Thyroid Function Tests No results for input(s): "TSH", "T4TOTAL", "T3FREE", "THYROIDAB" in the last 72 hours.  Invalid input(s): "FREET3" _____________  DG Chest 2 View  Result Date: 02/07/2023 CLINICAL DATA:  [id number] ICD (implantable cardioverter-defibrillator) in place [id number]. EXAM: CHEST - 2 VIEW COMPARISON:  Chest radiograph 02/04/2023. FINDINGS: Interval placement of a left chest AICD with lead projecting over the right ventricle. No pneumothorax. Improved expansion of the lungs. No focal airspace disease. Stable mild pulmonary venous congestion and mild cardiomegaly. No pleural effusion. IMPRESSION: Interval placement of a left chest AICD with lead projecting over the right ventricle. No pneumothorax. Electronically Signed   By: Orvan Falconer M.D.   On: 02/07/2023 08:56   EP PPM/ICD IMPLANT  Result Date: 02/06/2023  CONCLUSIONS:  1. Ischemic cardiomyopathy with VF arrest.  2. Successful ICD implantation.  3. No early apparent complications. ICD Criteria Current LVEF:40%. Within 12 months prior to implant: Yes Heart failure history: Yes, Class II Cardiomyopathy history: Yes, Ischemic Cardiomyopathy - Prior MI. Atrial Fibrillation/Atrial Flutter: Yes, Paroxysmal. Ventricular tachycardia history: No. Cardiac arrest history: Yes, Ventricular Fibrillation. History of syndromes with risk of sudden death: No. Previous ICD: No. Current ICD indication: Secondary PPM indication: No.  Class I  or II Bradycardia indication present: no Beta Blocker therapy for 3 or more months: Yes, prescribed. Ace Inhibitor/ARB therapy for 3 or more months: Yes, prescribed. Lewayne Bunting, MD 4:12 PM 02/06/2023   ECHOCARDIOGRAM COMPLETE  Result Date: 02/04/2023    ECHOCARDIOGRAM REPORT   Patient Name:   Michael Bell Date of Exam: 02/04/2023 Medical Rec #:  [id number]    Height:       74.0 in Accession #:    [id number]   Weight:       275.0 lb Date of Birth:  Feb 05, 1961    BSA:          2.488 m Patient Age:    62 years     BP:           133/101 mmHg Patient Gender: M            HR:           58 bpm. Exam Location:  Inpatient Procedure: 2D Echo, Cardiac Doppler, Color Doppler and Intracardiac  Opacification Agent Indications:    Cardiac arrest I46.9  History:        Patient has prior history of Echocardiogram examinations, most                 recent 10/18/2022. CHF; Risk Factors:Hypertension.  Sonographer:    Darlys Gales Referring Phys: [id number] [facility] PATWARDHAN IMPRESSIONS  1. Left ventricular ejection fraction, by estimation, is <20%. The left ventricle has severely decreased function. The left ventricle demonstrates global hypokinesis. The left ventricular internal cavity size was mildly dilated. There is mild concentric  left ventricular hypertrophy. Left ventricular diastolic parameters are consistent with Grade II diastolic dysfunction (pseudonormalization). Elevated left atrial pressure.  2. Right ventricular systolic function is severely reduced. The right ventricular size is severely enlarged. The estimated right ventricular systolic pressure is 41.2 mmHg.  3. Left atrial size was severely dilated.  4. Right atrial size was severely dilated.  5. The mitral valve is normal in structure. No evidence of mitral valve regurgitation.  6. Tricuspid valve regurgitation is mild to moderate.  7. The aortic valve is tricuspid. There is mild thickening of the aortic valve. Aortic valve regurgitation is not  visualized. No aortic stenosis is present.  8. The inferior vena cava is dilated in size with <50% respiratory variability, suggesting right atrial pressure of 15 mmHg. Comparison(s): Prior images reviewed side by side. The left ventricular function is significantly worse. FINDINGS  Left Ventricle: Pulsus alternans is seen by LVOT pulsed Doppler, a sign of very low cardiac output and poor prognosis. Left ventricular ejection fraction, by estimation, is <20%. The left ventricle has severely decreased function. The left ventricle demonstrates global hypokinesis. Definity contrast agent was given IV to delineate the left ventricular endocardial borders. The left ventricular internal cavity size was mildly dilated. There is mild concentric left ventricular hypertrophy. Left ventricular diastolic parameters are consistent with Grade II diastolic dysfunction (pseudonormalization). Elevated left atrial pressure. Right Ventricle: The right ventricular size is severely enlarged. No increase in right ventricular wall thickness. Right ventricular systolic function is severely reduced. The tricuspid regurgitant velocity is 2.56 m/s, and with an assumed right atrial pressure of 15 mmHg, the estimated right ventricular systolic pressure is 41.2 mmHg. Left Atrium: Left atrial size was severely dilated. Right Atrium: Right atrial size was severely dilated. Pericardium: There is no evidence of pericardial effusion. Mitral Valve: The mitral valve is normal in structure. No evidence of mitral valve regurgitation. Tricuspid Valve: The tricuspid valve is normal in structure. Tricuspid valve regurgitation is mild to moderate. Aortic Valve: The aortic valve is tricuspid. There is mild thickening of the aortic valve. Aortic valve regurgitation is not visualized. No aortic stenosis is present. Aortic valve mean gradient measures 3.0 mmHg. Aortic valve peak gradient measures 4.7 mmHg. Aortic valve area, by VTI measures 3.22 cm. Pulmonic  Valve: The pulmonic valve was normal in structure. Pulmonic valve regurgitation is trivial. Aorta: The aortic root is normal in size and structure. Venous: The inferior vena cava is dilated in size with less than 50% respiratory variability, suggesting right atrial pressure of 15 mmHg. IAS/Shunts: No atrial level shunt detected by color flow Doppler.  LEFT VENTRICLE PLAX 2D LVIDd:         5.90 cm   Diastology LVIDs:         5.30 cm   LV e' medial:    5.98 cm/s LV PW:         1.30 cm   LV E/e' medial:  14.2  LV IVS:        1.30 cm   LV e' lateral:   5.66 cm/s LVOT diam:     2.00 cm   LV E/e' lateral: 15.0 LV SV:         53 LV SV Index:   21 LVOT Area:     3.14 cm  RIGHT VENTRICLE RV S prime:     5.77 cm/s TAPSE (M-mode): 1.5 cm LEFT ATRIUM              Index        RIGHT ATRIUM           Index LA Vol (A2C):   163.0 ml 65.50 ml/m  RA Area:     27.50 cm LA Vol (A4C):   75.2 ml  30.22 ml/m  RA Volume:   92.00 ml  36.97 ml/m LA Biplane Vol: 118.0 ml 47.42 ml/m  AORTIC VALVE AV Area (Vmax):    3.11 cm AV Area (Vmean):   2.94 cm AV Area (VTI):     3.22 cm AV Vmax:           108.00 cm/s AV Vmean:          79.600 cm/s AV VTI:            0.166 m AV Peak Grad:      4.7 mmHg AV Mean Grad:      3.0 mmHg LVOT Vmax:         107.00 cm/s LVOT Vmean:        74.500 cm/s LVOT VTI:          0.170 m LVOT/AV VTI ratio: 1.02  AORTA Ao Root diam: 3.30 cm MITRAL VALVE               TRICUSPID VALVE MV Area (PHT): 3.30 cm    TR Peak grad:   26.2 mmHg MV Decel Time: 230 msec    TR Vmax:        256.00 cm/s MV E velocity: 85.00 cm/s                            SHUNTS                            Systemic VTI:  0.17 m                            Systemic Diam: 2.00 cm Rachelle Hora Croitoru MD Electronically signed by Thurmon Fair MD Signature Date/Time: 02/04/2023/6:21:54 PM    Final    DG Shoulder 1V Right  Result Date: 02/04/2023 CLINICAL DATA:  Right shoulder pain. EXAM: RIGHT SHOULDER - 1 VIEW COMPARISON:  None Available. FINDINGS: There is  no evidence of fracture or dislocation. Mild degenerative changes seen involving right acromioclavicular joint. Soft tissues are unremarkable. 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 No acute abnormality seen. Electronically Signed   By: Lupita Raider M.D.   On: 02/04/2023 13:06   CARDIAC CATHETERIZATION  Result Date: 02/04/2023 Coronary angiography 02/04/2023: LM: Normal LAD: No significant disease Ramus: No significant disease Lcx: Large, codominant. Mid 50% disease, followed by very distal occlusion (unchanged since 07/2021) RCA: Small, codominant. Diffuse 80-90% mid vessel disease  Coronary angiogram unchanged compared to 10/20/2022 Vfib likely secondary to nonischemic cardiomyopathy. Consider ICD for secondary prevention. Continue GDMT for HFrEF. Currently  on IV amiodarone started by EMS. Continue for now. Non-system related delay due to simultaneous activation of another STEMI page that needed triaging. Elder Negus, MD   DG Chest Portable 1 View  Result Date: 02/04/2023 CLINICAL DATA:  Chest pain. EXAM: PORTABLE CHEST 1 VIEW COMPARISON:  Aug 04, 2021. FINDINGS: Stable cardiomegaly with mild central pulmonary vascular congestion and possible bilateral pulmonary edema. No consolidative process is noted. Bony thorax is unremarkable. IMPRESSION: Stable cardiomegaly with mild central pulmonary vascular congestion and possible bilateral pulmonary edema. Electronically Signed   By: Lupita Raider M.D.   On: 02/04/2023 08:09   Disposition   Pt is being discharged home today in good condition.  Follow-up Plans & Appointments     Follow-up Information     [facility]. Go in 13 day(s).   Specialty: Cardiology Why: Hospital follow up 02/21/23 @ 9 am PLease bring a current medication list to appointment FREE valet parking, Entrance C, off [address] [employer] information: [address] [place] [address] [phone]               Discharge Instructions     (HEART FAILURE PATIENTS) Call MD:  Anytime you have any of the following symptoms: 1) 3 pound weight gain in 24 hours or 5 pounds in 1 week 2) shortness of breath, with or without a dry hacking cough 3) swelling in the hands, feet or stomach 4) if you have to sleep on extra pillows at night in order to breathe.   Complete by: As directed    AMB referral to Phase II Cardiac Rehabilitation   Complete by: As directed    Diagnosis: Heart Failure (see criteria below if ordering Phase II)   Heart Failure Type: Chronic Systolic   After initial evaluation and assessments completed: Virtual Based Care may be provided alone or in conjunction with Phase 2 Cardiac Rehab based on patient barriers.: Yes   Intensive Cardiac Rehabilitation (ICR) MC location only OR Traditional Cardiac Rehabilitation (TCR) *If criteria for ICR are not met will enroll in TCR [facility] only): Yes   Diet - low sodium heart healthy   Complete by: As directed    Discharge instructions   Complete by: As directed    For patients with congestive heart failure, we give them these special instructions:  1. Follow a low-salt diet and watch your fluid intake. In general, you should not be taking in more than 2 liters of fluid per day (no more than 8 glasses per day). Some patients are restricted to less than 1.5 liters of fluid per day (no more than 6 glasses per day). This includes sources of water in foods like soup, coffee, tea, milk, etc. 2. Weigh yourself on the same scale at same time of day and keep a log. 3. Call your doctor: (Anytime you feel any of the following symptoms)  - 3-4 pound weight gain in 1-2 days or 2 pounds overnight  - Shortness of breath, with or without a dry hacking cough  - Swelling in the hands, feet or stomach  - If you have to sleep on extra pillows at night in order to breathe   IT IS IMPORTANT TO LET YOUR DOCTOR KNOW EARLY ON  IF YOU ARE HAVING SYMPTOMS SO WE CAN HELP YOU!   Increase activity slowly   Complete by: As directed         Discharge Medications   Allergies as of  02/07/2023   No Known Allergies      Medication List     STOP taking these medications    isosorbide mononitrate 60 MG 24 hr tablet Commonly known as: IMDUR   spironolactone 25 MG tablet Commonly known as: ALDACTONE       TAKE these medications    albuterol (2.5 MG/3ML) 0.083% nebulizer solution Commonly known as: PROVENTIL Take 3 mLs (2.5 mg total) by nebulization every 6 (six) hours as needed for wheezing or shortness of breath.   Ventolin HFA 108 (90 Base) MCG/ACT inhaler Generic drug: albuterol Inhale 1-2 puffs into the lungs every 4 (four) hours as needed for wheezing or shortness of breath.   amiodarone 200 MG tablet Commonly known as: PACERONE Take 1 tablet (200 mg total) by mouth 2 (two) times daily. What changed: when to take this   Anoro Ellipta 62.5-25 MCG/ACT Aepb Generic drug: umeclidinium-vilanterol Inhale 1 puff into the lungs daily.   apixaban 5 MG Tabs tablet Commonly known as: ELIQUIS Take 1 tablet (5 mg total) by mouth 2 (two) times daily. Resume SUNDAY, 11/17 Start taking on: February 12, 2023 What changed:  additional instructions These instructions start on February 12, 2023. If you are unsure what to do until then, ask your doctor or other care provider.   aspirin EC 81 MG tablet Take 1 tablet (81 mg total) by mouth daily. Swallow whole.   atorvastatin 80 MG tablet Commonly known as: LIPITOR Take 1 tablet (80 mg total) by mouth daily.   dapagliflozin propanediol 10 MG Tabs tablet Commonly known as: Farxiga Take 1 tablet (10 mg total) by mouth daily.   fluticasone 50 MCG/ACT nasal spray Commonly known as: FLONASE Place 2 sprays into both nostrils daily.   furosemide 40 MG tablet Commonly known as: LASIX Take 1 tablet (40 mg total) by mouth daily. Notes to patient: Take an  additional tablet if you have a weight gain of more than 3 pounds in one day   isosorbide-hydrALAZINE 20-37.5 MG tablet Commonly known as: BiDil Take 1 tablet by mouth 3 (three) times daily.   metoprolol succinate 50 MG 24 hr tablet Commonly known as: TOPROL-XL Take 1 tablet (50 mg total) by mouth daily. Take with or immediately following a meal.   ranolazine 500 MG 12 hr tablet Commonly known as: Ranexa Take 1 tablet (500 mg total) by mouth 2 (two) times daily.        Outstanding Labs/Studies   BMET at follow up appt  Duration of Discharge Encounter   Greater than 30 minutes including physician time.  Signed, Laverda Page, NP 02/07/2023, 10:34 AM

## 2023-02-07 NOTE — TOC Benefit Eligibility Note (Signed)
Patient Product/process development scientist completed.    The patient is insured through [facility] MEDICAID.     Ran test claim for Entresto 24-26 mg and the current 30 day co-pay is $4.00.  Ran test claim for Bidil 20-37.5 mg and the current 30 day co-pay is $4.00.  This test claim was processed through [facility]- copay amounts may vary at other pharmacies due to pharmacy/plan contracts, or as the patient moves through the different stages of their insurance plan.     Michael Bell, CPHT Pharmacy Technician III Certified Patient Advocate [facility] Patient Advocate Team Direct Number: [phone]  Fax: [phone]

## 2023-02-07 NOTE — Progress Notes (Addendum)
  Patient Name: Michael Bell Date of Encounter: 02/07/2023  Primary Cardiologist: Tessa Lerner, DO Electrophysiologist: None  Interval Summary   Stable s/p ICD from perspective.  Per report, pt found with contraband pain meds overnight.   The patient is doing well today. Anxious to leave but willing to stay for teaching.   Vital Signs    Vitals:   02/07/23 0533 02/07/23 0556 02/07/23 0700 02/07/23 0800  BP: (!) 152/90 (!) 162/84    Pulse:   77   Resp:  14 17 17   Temp:      TempSrc:      SpO2:   92%   Weight:      Height:        Intake/Output Summary (Last 24 hours) at 02/07/2023 0843 Last data filed at 02/07/2023 0600 Gross per 24 hour  Intake 535.25 ml  Output 250 ml  Net 285.25 ml   Filed Weights   02/04/23 0738  Weight: 124.7 kg    Physical Exam    GEN- The patient is well appearing, alert and oriented x 3 today.   Lungs- Clear to ausculation bilaterally, normal work of breathing Cardiac- Regular rate and rhythm, no murmurs, rubs or gallops GI- soft, NT, ND, + BS Extremities- no clubbing or cyanosis. No edema  Telemetry    NSR 70s (personally reviewed)  Hospital Course    Mr. Fiume is a 62 y.o. 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 He was brought to the cardiac Cath Lab and left heart catheterization was performed. This did not show any acute process and was overall unchanged from his last cardiac catheterization in August 2024. Review of ECG strips by EMS in the media tab of his medical chart appear to show polymorphic VT.   Assessment & Plan    Out-of-hospital cardiac arrest: Polymorphic VT PVCs Stable device check this am Stable CXR Wound care and arm restrictions reviewed with  pt and placed in AVS.  Continue amiodarone 200 mg BID until follow up.    Acute on chronic systolic heart failure:  LVEF now less than 20%, previously 35 to 40%. Continue metoprolol XL 50 mg once daily and uptitrate as able. Continue to [place]. GDMT per primary.    AKI Likely pre-renal in setting of arrest.  Continue to monitor. Consider ACE/ARB/ARNI when creatinine normalizes. Restart home spironolactone once creatinine normalizes.   CAD, Non-obstructive No acute process on cath 50% mid LCx disease and 80-90% RCA disease. Continue aspirin and statin   Atrial fibrillation Can resume Eliquis on SUNDAY, 11/17 CHA2DS2/VASc is 3. Will plan to resume Eliquis s/p ICD   For questions or updates, please contact [facility] Please consult [email or web address] for contact info under Cardiology/STEMI.  Signed, Graciella Freer, PA-C  02/07/2023, 8:43 AM   EP Attending  Patient seen and examined. He is stable overnight though restless and a little anxious. He denies chest pain or sob. His incision is tender but no hematoma. Interrogation of his ICD under my direction demonstrates normal VVI ICD function and CXR demonstrates stable lead position. He will be discharged home with usual followup.  Sharlot Gowda Hawken Bielby,MD

## 2023-02-07 NOTE — Progress Notes (Signed)
Patient attempting to leave AMA. Michael Glenn PA to bedside to speak with patient. Informed patient that Herbie Baltimore MD is in a procedure and will speak with patient after. AMA forms already signed, however patient has agreed to wait to see Dr. Herbie Baltimore.

## 2023-02-07 NOTE — Progress Notes (Signed)
Heart Failure Nurse Navigator Progress Note  PCP: Marrianne Mood, MD PCP-Cardiologist: Odis Hollingshead Admission Diagnosis: Cardiac arrest with ventricular fibrillation.  Admitted from: Home via EMS  Presentation:   Michael Bell presented with stabbing chest pain, diaphoretic,when lifted into EMS truck patient started seizing and went into afib. Patient shocked x3, 450 mg amiodarone and 3 epi's given.  CXR showed pulmonary edema, stable cardiomegaly, Taken for [facility] on 11/9 and 50% stenosis of mid LAD and diffuse 80-90% stenosis of mid RCA (coronary anatomy unchanged from 09/2022). Vfib likely secondary to NICM. ECHO on 11/9 showed EF <20% (was 35-40% in 09/2022), global hypokinesis, mild concentric LVH, G2DD, RV severely reduced.  S/p ICD implant on 11/11.    Patient requesting to leave AMA on 11/12 but is willing to stay for teaching and primary team.   Patient was interviewed for HF TOC, attempted to educate patient on the sign and symptoms of heart failure, daily weights, diet/ fluid restrictions, taking all medications as prescribed and attending all medical appointments, however kept falling asleep and then getting up and wandering. A HF TOC appointment was scheduled for [id number] @ 9 am.   ECHO/ LVEF: <20%  Clinical Course:  Past Medical History:  Diagnosis Date   Acute diastolic heart failure (HCC)    Arthritis    CHF (congestive heart failure) (HCC)    Headache    Hypertension    Hypertensive urgency      Social History   Socioeconomic History   Marital status: Married    Spouse name: Not on file   Number of children: Not on file   Years of education: Not on file   Highest education level: Not on file  Occupational History   Not on file  Tobacco Use   Smoking status: Every Day    Current packs/day: 1.50    Average packs/day: 1.5 packs/day for 0.9 years (1.3 ttl pk-yrs)    Types: Cigarettes    Start date: 25    Last attempt to quit: 2023   Smokeless tobacco: Never  Vaping  Use   Vaping status: Never Used  Substance and Sexual Activity   Alcohol use: Not Currently   Drug use: Not Currently    Types: Marijuana    Comment: 12/26/13   Sexual activity: Not on file  Other Topics Concern   Not on file  Social History Narrative   ** Merged History Encounter **       Social Determinants of Health   Financial Resource Strain: Not on file  Food Insecurity: Food Insecurity Present (02/06/2023)   Hunger Vital Sign    Worried About Running Out of Food in the Last Year: Sometimes true    Ran Out of Food in the Last Year: Sometimes true  Transportation Needs: No Transportation Needs (02/06/2023)   PRAPARE - Administrator, Civil Service (Medical): No    Lack of Transportation (Non-Medical): No  Physical Activity: Not on file  Stress: Not on file  Social Connections: Not on file   Education Assessment and Provision:  Detailed education and instructions provided on heart failure disease management including the following:  Signs and symptoms of Heart Failure When to call the physician Importance of daily weights Low sodium diet Fluid restriction Medication management Anticipated future follow-up appointments  Patient education given on each of the above topics.  Patient acknowledges understanding via teach back method and acceptance of all instructions.  Education Materials:  "Living Better With Heart Failure" Booklet, HF zone  tool, & Daily Weight Tracker Tool.  Patient has scale at home: yes Patient has pill box at home: NA    High Risk Criteria for Readmission and/or Poor Patient Outcomes: Heart failure hospital admissions (last 6 months): 0  No Show rate: 11% Difficult social situation: No, lives with wife Demonstrates medication adherence: No Primary Language: English Literacy level: Reading, writing,   Barriers of Care:   Medication compliance Diet/ fluid restrictions/daily weights Substance cessation ( alcohol and  smoking)   Considerations/Referrals:   Referral made to Heart Failure Pharmacist Stewardship: Yes Referral made to Heart Failure CSW/NCM TOC: No Referral made to Heart & Vascular TOC clinic: Yes, 02/21/2023 @ 9 am  Items for Follow-up on DC/TOC: Medication compliance Diet/ fluid restrictions/ daily weights Continued HF education Substance cessation ( alcohol and smoking)    Rhae Hammock, BSN, RN Heart Failure Teacher, adult education Only

## 2023-02-08 ENCOUNTER — Other Ambulatory Visit (HOSPITAL_BASED_OUTPATIENT_CLINIC_OR_DEPARTMENT_OTHER): Payer: Self-pay

## 2023-02-08 ENCOUNTER — Other Ambulatory Visit (HOSPITAL_COMMUNITY): Payer: Self-pay

## 2023-02-08 MED FILL — Fentanyl Citrate Preservative Free (PF) Inj 100 MCG/2ML: INTRAMUSCULAR | Qty: 2 | Status: AC

## 2023-02-08 MED FILL — Midazolam HCl Inj 2 MG/2ML (Base Equivalent): INTRAMUSCULAR | Qty: 3 | Status: AC

## 2023-02-10 ENCOUNTER — Other Ambulatory Visit (HOSPITAL_COMMUNITY): Payer: Self-pay

## 2023-02-10 ENCOUNTER — Telehealth: Payer: Self-pay

## 2023-02-10 MED ORDER — AMIODARONE HCL 200 MG PO TABS
ORAL_TABLET | ORAL | 1 refills | Status: DC
  Filled 2023-02-10 – 2023-02-20 (×2): qty 60, 16d supply, fill #0
  Filled 2023-03-04 – 2023-03-18 (×4): qty 60, 30d supply, fill #1

## 2023-02-10 NOTE — Telephone Encounter (Signed)
  Patient called to advise to of directions above. Med list updated. Patient and wife on the phone voiced understanding. Patient also instructed of shock plan and no driving x6 months per [place] DMV x6 months. Shock plan reviewed with pt w/ verbal understanding and also sent shock plan in mychart message to have in case he needs it to refer to until seen in device clinic. Pt advised to call if he has any further questions or concerns.

## 2023-02-10 NOTE — Telephone Encounter (Signed)
Alert received from [employer] for 7 VT/VF episodes with Therapy on 02/08/23 between 05:19 and 14:26, longest 33 sec. EGMs c/w VT treated appropriately and successfully with ATP x 1 burst per episode. 2 episodes with Type 2 break. Average V rates 216-237 bpm. Routed to Triage for review. 91 VT-NS episodes, all on episode log on 02/08/23 between 06:38 and 14:29, longest 7 min 7 sec. EGMs c/w VT at 170-212 bpm. EGM #94 c/w salvos of NSVT. EGMs #49, 50, 51 and 92 show false termination due to rate dropping as low as 156 bpm, below detection of 170 bpm.   Patient denies any symptoms at this time. Unable to recall any symptoms during VT/ATP events. Patient report of intermittent shortness of breath with ambulation at times, although denies today. Denies chest pain, dizziness, lightheadedness or other cardiac symptoms. Patient is currently under [place] DMV driving restrictions. Will send secure chat to [place], [place] who is covering in hospital. Patient advised I will follow up with him once I hear back about further instructions. Pt voiced understanding.

## 2023-02-13 ENCOUNTER — Encounter: Payer: Self-pay | Admitting: Cardiology

## 2023-02-13 ENCOUNTER — Ambulatory Visit: Payer: Medicaid Other | Attending: Cardiology | Admitting: Cardiology

## 2023-02-13 VITALS — BP 114/80 | HR 62 | Ht 74.0 in | Wt 255.4 lb

## 2023-02-13 DIAGNOSIS — N179 Acute kidney failure, unspecified: Secondary | ICD-10-CM | POA: Diagnosis not present

## 2023-02-13 DIAGNOSIS — I5022 Chronic systolic (congestive) heart failure: Secondary | ICD-10-CM | POA: Diagnosis not present

## 2023-02-13 DIAGNOSIS — I4901 Ventricular fibrillation: Secondary | ICD-10-CM

## 2023-02-13 DIAGNOSIS — F17210 Nicotine dependence, cigarettes, uncomplicated: Secondary | ICD-10-CM

## 2023-02-13 DIAGNOSIS — I255 Ischemic cardiomyopathy: Secondary | ICD-10-CM

## 2023-02-13 DIAGNOSIS — I10 Essential (primary) hypertension: Secondary | ICD-10-CM

## 2023-02-13 DIAGNOSIS — I469 Cardiac arrest, cause unspecified: Secondary | ICD-10-CM

## 2023-02-13 DIAGNOSIS — I2510 Atherosclerotic heart disease of native coronary artery without angina pectoris: Secondary | ICD-10-CM

## 2023-02-13 DIAGNOSIS — Z7901 Long term (current) use of anticoagulants: Secondary | ICD-10-CM

## 2023-02-13 DIAGNOSIS — Z79899 Other long term (current) drug therapy: Secondary | ICD-10-CM

## 2023-02-13 DIAGNOSIS — I480 Paroxysmal atrial fibrillation: Secondary | ICD-10-CM

## 2023-02-13 DIAGNOSIS — Z789 Other specified health status: Secondary | ICD-10-CM

## 2023-02-13 DIAGNOSIS — I700 Atherosclerosis of aorta: Secondary | ICD-10-CM

## 2023-02-13 DIAGNOSIS — F17200 Nicotine dependence, unspecified, uncomplicated: Secondary | ICD-10-CM

## 2023-02-13 NOTE — Patient Instructions (Addendum)
Medication Instructions:  Your physician recommends that you continue on your current medications as directed. Please refer to the Current Medication list given to you today.  *If you need a refill on your cardiac medications before your next appointment, please call your pharmacy*  Lab Work: Lab draw for BMP, Pro-BNP, and hemoglobin/ hematocrit to be completed between 03/10/23-03/12/23 to prepare for the 1 month follow-up appointment with Dr. Odis Hollingshead.  If you have labs (blood work) drawn today and your tests are completely normal, you will receive your results only by: MyChart Message (if you have MyChart) OR A paper copy in the mail If you have any lab test that is abnormal or we need to change your treatment, we will call you to review the results.  Testing/Procedures: None ordered today.  Follow-Up: At [facility], you and your health needs are our priority.  As part of our continuing mission to provide you with exceptional heart care, we have created designated Provider Care Teams.  These Care Teams include your primary Cardiologist (physician) and Advanced Practice Providers (APPs -  Physician Assistants and Nurse Practitioners) who all work together to provide you with the care you need, when you need it.  Your next appointment:   1 month(s) (03/13/23 at 11:30 am)  The format for your next appointment:   In Person  Provider:   Tessa Lerner, DO {

## 2023-02-13 NOTE — Progress Notes (Signed)
Cardiology Office Note:  .   Date:  02/13/2023  ID:  Michael Bell, DOB 11-15-1960, MRN [id number] PCP:  Marrianne Mood, MD  Former Cardiology Providers: None [facility] Providers Cardiologist:  Tessa Lerner, DO, [facility] (established care 08/04/2021)  Electrophysiologist:  Lewayne Bunting, MD  Electrophysiologist:  Lewayne Bunting, MD  Click to update primary MD,subspecialty MD or APP then REFRESH:1}    Chief Complaint  Patient presents with   Hospitalization Follow-up    S/p cardiac arrest    History of Present Illness: .   Michael Bell is a 62 y.o. Caucasian male whose past medical history and cardiovascular risk factors includes: Status post V-fib arrest, ischemic cardiomyopathy, chronic HFrEF, paroxysmal atrial fibrillation, NSTEMI (May 2023), hypertension, smoker (started at the age of 15 averaging 2 to 3 packs/day until the age of 22 now down to 1 packs/day), excessive alcohol consumption, marijuana use, coronary and aortic atherosclerosis (nongated CT study 12/2020), emphysema, infrarenal aortic aneurysm.   In May 2023 patient ruled in for NSTEMI underwent angiography and was noted to have disease in the RCA and L PDA see angiography results for more details.  Given his cardiomyopathy and HFrEF shared decision was to uptitrate GDMT.  However he was lost to follow-up until January 2024 and he has been out of majority of his medications in the interim due to loss of insurance.  He was hospitalized in July 2024 for worsening dyspnea.  At the last office visit the shared decision was to uptitrate GDMT.  However, in the interim, he presented to the hospital again in November 2024 due to out-of-hospital cardiac arrest due to polymorphic VT/PVC.  He underwent left heart catheterization, EP evaluation, and ICD implant.  He presents today for follow-up accompanied by his wife.  Status post discharge she is doing well from a cardiac standpoint.  Denies any anginal chest pain, heart failure  symptoms, near-syncope or syncopal events.  Reviewed the discharge summary from 02/07/2023 and the ICD alerts from 02/10/2023.  Patient is currently on amiodarone 400 mg p.o. twice daily with plans of transitioning to 200 mg p.o. twice daily in 2 weeks.  Review of Systems: .   Review of Systems  Cardiovascular:  Positive for dyspnea on exertion. Negative for chest pain, claudication, irregular heartbeat, leg swelling, near-syncope, orthopnea, palpitations, paroxysmal nocturnal dyspnea and syncope.  Respiratory:  Negative for shortness of breath.   Hematologic/Lymphatic: Negative for bleeding problem.    Studies Reviewed:   EKG: EKG Interpretation Date/Time:  Monday February 13 2023 09:53:59 EST Text Interpretation: Normal sinus rhythm Non-specific intra-ventricular conduction delay Minimal voltage criteria for LVH, may be normal variant ( Cornell product ) When compared with ECG of 07-Feb-2023 06:44, No significant change was found Confirmed by Tessa Lerner [id number]) on 02/13/2023 9:57:56 AM  Echocardiogram: November 2024  1. Left ventricular ejection fraction, by estimation, is <20%. The left  ventricle has severely decreased function. The left ventricle demonstrates  global hypokinesis. The left ventricular internal cavity size was mildly  dilated. There is mild concentric   left ventricular hypertrophy. Left ventricular diastolic parameters are  consistent with Grade II diastolic dysfunction (pseudonormalization).  Elevated left atrial pressure.   2. Right ventricular systolic function is severely reduced. The right  ventricular size is severely enlarged. The estimated right ventricular  systolic pressure is 41.2 mmHg.   3. Left atrial size was severely dilated.   4. Right atrial size was severely dilated.   5. The mitral valve is normal in structure.  No evidence of mitral valve  regurgitation.   6. Tricuspid valve regurgitation is mild to moderate.   7. The aortic valve is  tricuspid. There is mild thickening of the aortic  valve. Aortic valve regurgitation is not visualized. No aortic stenosis is  present.   8. The inferior vena cava is dilated in size with <50% respiratory  variability, suggesting right atrial pressure of 15 mmHg.   Coronary angiography 02/04/2023: LM: Normal LAD: No significant disease Ramus: No significant disease Lcx: Large, codominant. Mid 50% disease, followed by very distal occlusion (unchanged since 07/2021) RCA: Small, codominant. Diffuse 80-90% mid vessel disease   Coronary angiogram unchanged compared to 10/20/2022  RADIOLOGY: NA  Risk Assessment/Calculations:   Click Here to Calculate/Change CHADS2VASc Score The patient's CHADS2-VASc score is 3, indicating a 3.2% annual risk of stroke.   CHF History: Yes HTN History: Yes Diabetes History: No Stroke History: No Vascular Disease History: Yes  Labs:       Latest Ref Rng & Units 02/07/2023    7:50 AM 02/06/2023    4:12 AM 02/04/2023   11:34 AM  CBC  WBC 4.0 - 10.5 K/uL 10.7  12.9  14.1   Hemoglobin 13.0 - 17.0 g/dL 60.7  37.1  06.2   Hematocrit 39.0 - 52.0 % 38.0  38.4  43.5   Platelets 150 - 400 K/uL 174  152  191        Latest Ref Rng & Units 02/07/2023    7:50 AM 02/06/2023    4:12 AM 02/05/2023    2:49 AM  BMP  Glucose 70 - 99 mg/dL 694  99  854   BUN 8 - 23 mg/dL 28  32  29   Creatinine 0.61 - 1.24 mg/dL 6.27  0.35  0.09   Sodium 135 - 145 mmol/L 134  133  132   Potassium 3.5 - 5.1 mmol/L 4.0  4.3  4.7   Chloride 98 - 111 mmol/L 105  104  104   CO2 22 - 32 mmol/L 20  21  16    Calcium 8.9 - 10.3 mg/dL 8.4  8.6  8.5       Latest Ref Rng & Units 02/07/2023    7:50 AM 02/06/2023    4:12 AM 02/05/2023    2:49 AM  CMP  Glucose 70 - 99 mg/dL 381  99  829   BUN 8 - 23 mg/dL 28  32  29   Creatinine 0.61 - 1.24 mg/dL 9.37  1.69  6.78   Sodium 135 - 145 mmol/L 134  133  132   Potassium 3.5 - 5.1 mmol/L 4.0  4.3  4.7   Chloride 98 - 111 mmol/L 105  104   104   CO2 22 - 32 mmol/L 20  21  16    Calcium 8.9 - 10.3 mg/dL 8.4  8.6  8.5     Lab Results  Component Value Date   CHOL 121 02/05/2023   HDL 43 02/05/2023   LDLCALC 71 02/05/2023   LDLDIRECT 93.6 08/04/2021   TRIG 34 02/05/2023   CHOLHDL 2.8 02/05/2023   No results for input(s): "LIPOA" in the last 8760 hours. No components found for: "NTPROBNP" No results for input(s): "PROBNP" in the last 8760 hours. Recent Labs    10/18/22 0116  TSH 1.425   Physical Exam:    Today's Vitals   02/13/23 0923  BP: 114/80  Pulse: 62  SpO2: 93%  Weight: 255 lb 6.4 oz (115.8 kg)  Height: 6\' 2"  (1.88 m)   Body mass index is 32.79 kg/m. Wt Readings from Last 3 Encounters:  02/13/23 255 lb 6.4 oz (115.8 kg)  02/04/23 275 lb (124.7 kg)  11/17/22 255 lb 12.8 oz (116 kg)    Physical Exam  Constitutional: He appears chronically ill.  hemodynamically stable  Neck: No JVD present.  Cardiovascular: Normal rate, regular rhythm, S1 normal and S2 normal. Exam reveals no gallop, no S3 and no S4.  No murmur heard. ICD in place to the left infraclavicular region.  Steri-Strips present.  No hematoma or signs for infection  Pulmonary/Chest: Effort normal. No stridor. He has no wheezes. He has no rales.  Decreased breath sounds bilaterally.  Abdominal: Soft. Bowel sounds are normal. He exhibits no distension. There is no abdominal tenderness.  Musculoskeletal:        General: No edema.     Cervical back: Neck supple.  Neurological: He is alert and oriented to person, place, and time. He has intact cranial nerves (2-12).  Skin: Skin is warm.     Impression & Recommendation(s):  Impression:   ICD-10-CM   1. Chronic HFrEF (heart failure with reduced ejection fraction) (HCC)  I50.22 EKG 12-Lead    Basic metabolic panel    Pro b natriuretic peptide (BNP)    Hemoglobin and hematocrit, blood    Hemoglobin and hematocrit, blood    Pro b natriuretic peptide (BNP)    Basic metabolic panel    2.  Ischemic cardiomyopathy  I25.5     3. AKI (acute kidney injury) (HCC)  N17.9     4. Cardiac arrest with ventricular fibrillation (HCC)  I46.9    I49.01     5. Long term current use of antiarrhythmic drug  Z79.899     6. Paroxysmal atrial fibrillation (HCC)  I48.0     7. Long term (current) use of anticoagulants  Z79.01     8. Coronary artery disease involving native coronary artery of native heart without angina pectoris  I25.10     9. Benign hypertension  I10     10. Atherosclerosis of aorta (HCC)  I70.0     11. Tobacco dependence  F17.200     12. Alcohol use  Z78.9     13. Cigarette smoker  F17.210        Recommendation(s):  Chronic HFrEF (heart failure with reduced ejection fraction) (HCC) Ischemic cardiomyopathy Coronary artery disease involving native coronary artery of native heart without angina pectoris Stage C, NYHA class II/III. Hemodynamically stable. Continue Farxiga 10 mg p.o. daily. Continue Lasix 40 mg p.o. daily. Continue BiDil 1 tablet 3 times daily. Continue Toprol-XL 50 mg p.o. daily. Continue Ranexa 500 mg p.o. twice daily Once renal function is back to baseline would like to transition him from BiDil to [facility] and spironolactone. Given his recent ICD alerts would like to focus on managing his NSVT/VT and give some time for renal improvement as well. EKG today is nonischemic  AKI (acute kidney injury) (HCC) Baseline creatinine around 1.3 mg/dL. Creatinine at discharge 1.6 mg/dL. Continue current medical therapy. Will recheck labs prior to the next office visit.  Cardiac arrest with ventricular fibrillation [facility]) Long term current use of antiarrhythmic drug Presented to the hospital in November 2024 with V-fib arrest Has undergone appropriate cardiac workup -now status post ICD implant for secondary prevention.   Since discharge patient has had ICD alerts and therefore amiodarone was increased from 200 mg p.o. twice daily to 400 mg p.o. twice  daily for additional 2 weeks.  Thereafter he will transition back to 200 mg p.o. twice daily He has a device check appointment later this week.  Paroxysmal atrial fibrillation (HCC) Long term (current) use of anticoagulants Rate control: Metoprolol. Rhythm control: Amiodarone. Thromboembolic prophylaxis: Eliquis Does not endorse evidence of bleeding.  Will check H&H prior to the next office visit.  Benign hypertension Office blood pressures are well-controlled. Medication changes as discussed above  Atherosclerosis of aorta (HCC) Continue aspirin and statin therapy.  Tobacco dependence Tobacco cessation counseling: Currently smoking 1 packs/day   Patient denies claudication Patient is informed to follow-up with PCP and consider lung cancer screening if and when appropriate. Consider screening for peripheral artery disease with ankle-brachial index-defer to PCP.  He is informed of the dangers of tobacco abuse including stroke, cancer, and MI, as well as benefits of tobacco cessation. He is willing to quit at this time. 5 mins were spent counseling patient cessation techniques. We discussed various methods to help quit smoking, including deciding on a date to quit, joining a support group, pharmacological agents- nicotine gum/patch/lozenges.  I will reassess his progress at the next follow-up visit  Alcohol use Reemphasized importance of consuming no more than 2 standard drinks per day.  Orders Placed:  Orders Placed This Encounter  Procedures   Basic metabolic panel    Standing Status:   Future    Number of Occurrences:   1    Standing Expiration Date:   02/13/2024   Pro b natriuretic peptide (BNP)    Standing Status:   Future    Number of Occurrences:   1    Standing Expiration Date:   02/13/2024   Hemoglobin and hematocrit, blood    Standing Status:   Future    Number of Occurrences:   1    Standing Expiration Date:   02/13/2024   EKG 12-Lead    As part of medical  decision making discharge summary November 2024, echocardiogram results November 2024, heart catheterization report November 2024, ICD implant and device alerts from November 2024, EKG performed and independently reviewed, labs from November 2024, were reviewed at today's office visit total time spent 42 minutes.  Final Medication List:   No orders of the defined types were placed in this encounter.   There are no discontinued medications.   Current Outpatient Medications:    albuterol (PROVENTIL) (2.5 MG/3ML) 0.083% nebulizer solution, Take 3 mLs (2.5 mg total) by nebulization every 6 (six) hours as needed for wheezing or shortness of breath., Disp: 75 mL, Rfl: 12   albuterol (VENTOLIN HFA) 108 (90 Base) MCG/ACT inhaler, Inhale 1-2 puffs into the lungs every 4 (four) hours as needed for wheezing or shortness of breath., Disp: 18 g, Rfl: 0   amiodarone (PACERONE) 200 MG tablet, Take 2 tablets (400 mg total) by mouth 2 (two) times daily for 14 days, THEN 1 tablet (200 mg total) 2 (two) times daily., Disp: 60 tablet, Rfl: 1   apixaban (ELIQUIS) 5 MG TABS tablet, Take 1 tablet (5 mg total) by mouth 2 (two) times daily. Resume SUNDAY, 11/17, Disp: , Rfl:    aspirin EC 81 MG tablet, Take 1 tablet (81 mg total) by mouth daily. Swallow whole., Disp: 90 tablet, Rfl: 1   atorvastatin (LIPITOR) 80 MG tablet, Take 1 tablet (80 mg total) by mouth daily., Disp: 30 tablet, Rfl: 1   dapagliflozin propanediol (FARXIGA) 10 MG TABS tablet, Take 1 tablet (10 mg total) by mouth daily., Disp: 30  tablet, Rfl: 1   fluticasone (FLONASE) 50 MCG/ACT nasal spray, Place 2 sprays into both nostrils daily., Disp: 16 g, Rfl: 2   furosemide (LASIX) 40 MG tablet, Take 1 tablet (40 mg total) by mouth daily., Disp: 30 tablet, Rfl: 1   isosorbide-hydrALAZINE (BIDIL) 20-37.5 MG tablet, Take 1 tablet by mouth 3 (three) times daily., Disp: 90 tablet, Rfl: 2   metoprolol succinate (TOPROL-XL) 50 MG 24 hr tablet, Take 1 tablet (50 mg  total) by mouth daily. Take with or immediately following a meal., Disp: 30 tablet, Rfl: 1   ranolazine (RANEXA) 500 MG 12 hr tablet, Take 1 tablet (500 mg total) by mouth 2 (two) times daily., Disp: 60 tablet, Rfl: 1   umeclidinium-vilanterol (ANORO ELLIPTA) 62.5-25 MCG/ACT AEPB, Inhale 1 puff into the lungs daily., Disp: 60 each, Rfl: 1  Consent:   N/A  Disposition:   1 month sooner if needed Patient may be asked to follow-up sooner based on the results of the above-mentioned testing.  His questions and concerns were addressed to his satisfaction. He voices understanding of the recommendations provided during this encounter.    Signed, Tessa Lerner, DO, [facility]  [facility]  [address] [place], [place] [address] 02/13/2023 10:33 AM

## 2023-02-16 ENCOUNTER — Ambulatory Visit: Payer: Medicaid Other | Attending: Cardiovascular Disease

## 2023-02-16 DIAGNOSIS — I469 Cardiac arrest, cause unspecified: Secondary | ICD-10-CM | POA: Diagnosis not present

## 2023-02-16 DIAGNOSIS — I4901 Ventricular fibrillation: Secondary | ICD-10-CM | POA: Diagnosis not present

## 2023-02-16 LAB — CUP PACEART INCLINIC DEVICE CHECK
Date Time Interrogation Session: 20241121131958
HighPow Impedance: 82 Ohm
Implantable Lead Connection Status: 753985
Implantable Lead Implant Date: 20241111
Implantable Lead Location: 753860
Implantable Lead Model: 138
Implantable Lead Serial Number: 305868
Implantable Pulse Generator Implant Date: 20241111
Lead Channel Impedance Value: 504 Ohm
Lead Channel Pacing Threshold Amplitude: 0.8 V
Lead Channel Pacing Threshold Pulse Width: 0.4 ms
Lead Channel Sensing Intrinsic Amplitude: 16.4 mV
Lead Channel Setting Pacing Amplitude: 3.5 V
Lead Channel Setting Pacing Pulse Width: 0.4 ms
Lead Channel Setting Sensing Sensitivity: 0.6 mV
Pulse Gen Serial Number: 221194
Zone Setting Status: 755011

## 2023-02-16 NOTE — Patient Instructions (Signed)

## 2023-02-16 NOTE — Progress Notes (Signed)
Wound check appointment. Steri-strips removed. Wound without redness or edema. Incision edges approximated, wound well healed. Normal device function. Thresholds, sensing, and impedances consistent with implant measurements. Device programmed at 3.5V for extra safety margin until 3 month visit. Histogram distribution appropriate for patient and level of activity. No mode switches or ventricular arrhythmias since known episode on 02/08/23. Patient educated about wound care, arm mobility, lifting restrictions, shock plan. ROV in 3 months with implanting physician.

## 2023-02-20 ENCOUNTER — Other Ambulatory Visit (HOSPITAL_COMMUNITY): Payer: Self-pay

## 2023-02-21 ENCOUNTER — Other Ambulatory Visit (HOSPITAL_COMMUNITY): Payer: Self-pay

## 2023-02-21 ENCOUNTER — Ambulatory Visit (HOSPITAL_COMMUNITY)
Admit: 2023-02-21 | Discharge: 2023-02-21 | Disposition: A | Discharge status: Home / Self Care | Payer: Medicaid Other | Source: Ambulatory Visit | Attending: Physician Assistant | Admitting: Physician Assistant

## 2023-02-21 ENCOUNTER — Encounter (HOSPITAL_COMMUNITY): Payer: Self-pay

## 2023-02-21 VITALS — BP 190/110 | HR 70 | Ht 74.0 in | Wt 255.8 lb

## 2023-02-21 DIAGNOSIS — I4901 Ventricular fibrillation: Secondary | ICD-10-CM | POA: Diagnosis not present

## 2023-02-21 DIAGNOSIS — Z8674 Personal history of sudden cardiac arrest: Secondary | ICD-10-CM | POA: Diagnosis not present

## 2023-02-21 DIAGNOSIS — I5020 Unspecified systolic (congestive) heart failure: Secondary | ICD-10-CM

## 2023-02-21 DIAGNOSIS — J449 Chronic obstructive pulmonary disease, unspecified: Secondary | ICD-10-CM | POA: Diagnosis not present

## 2023-02-21 DIAGNOSIS — I480 Paroxysmal atrial fibrillation: Secondary | ICD-10-CM | POA: Diagnosis not present

## 2023-02-21 DIAGNOSIS — N179 Acute kidney failure, unspecified: Secondary | ICD-10-CM | POA: Insufficient documentation

## 2023-02-21 DIAGNOSIS — I110 Hypertensive heart disease with heart failure: Secondary | ICD-10-CM | POA: Diagnosis not present

## 2023-02-21 DIAGNOSIS — Z79899 Other long term (current) drug therapy: Secondary | ICD-10-CM | POA: Insufficient documentation

## 2023-02-21 DIAGNOSIS — I10 Essential (primary) hypertension: Secondary | ICD-10-CM | POA: Diagnosis not present

## 2023-02-21 DIAGNOSIS — I2510 Atherosclerotic heart disease of native coronary artery without angina pectoris: Secondary | ICD-10-CM

## 2023-02-21 DIAGNOSIS — R0683 Snoring: Secondary | ICD-10-CM | POA: Diagnosis not present

## 2023-02-21 DIAGNOSIS — I428 Other cardiomyopathies: Secondary | ICD-10-CM | POA: Diagnosis not present

## 2023-02-21 LAB — COMPREHENSIVE METABOLIC PANEL
ALT: 29 U/L (ref 0–44)
AST: 24 U/L (ref 15–41)
Albumin: 3.1 g/dL — ABNORMAL LOW (ref 3.5–5.0)
Alkaline Phosphatase: 90 U/L (ref 38–126)
Anion gap: 7 (ref 5–15)
BUN: 24 mg/dL — ABNORMAL HIGH (ref 8–23)
CO2: 23 mmol/L (ref 22–32)
Calcium: 9.1 mg/dL (ref 8.9–10.3)
Chloride: 105 mmol/L (ref 98–111)
Creatinine, Ser: 1.22 mg/dL (ref 0.61–1.24)
GFR, Estimated: >60 mL/min (ref >60)
Glucose, Bld: 91 mg/dL (ref 70–99)
Potassium: 4.6 mmol/L (ref 3.5–5.1)
Sodium: 135 mmol/L (ref 135–145)
Total Bilirubin: 0.4 mg/dL (ref <1.2)
Total Protein: 7.5 g/dL (ref 6.5–8.1)

## 2023-02-21 LAB — BRAIN NATRIURETIC PEPTIDE: B Natriuretic Peptide: 31.6 pg/mL (ref 0.0–100.0)

## 2023-02-21 MED ORDER — ENTRESTO 49-51 MG PO TABS
1.0000 | ORAL_TABLET | Freq: Two times a day (BID) | ORAL | 6 refills | Status: DC
  Filled 2023-02-21: qty 60, 30d supply, fill #0

## 2023-02-21 MED ORDER — ENTRESTO 49-51 MG PO TABS
1.0000 | ORAL_TABLET | Freq: Two times a day (BID) | ORAL | 6 refills | Status: DC
  Filled 2023-02-21: qty 60, 30d supply, fill #0
  Filled 2023-03-04 – 2023-03-06 (×2): qty 60, 30d supply, fill #1

## 2023-02-21 NOTE — Patient Instructions (Addendum)
Start Entresto 49/51 mg twice daily. Rx sent to local pharmacy. Per our pharmacist, prior authorization is not required and your co-pay will be $4.00.  Labs today - will call you if abnormal. We have ordered a sleep study for you today - see below.  We have ordered Cardiac PET scan for you. Prior authorization if needed will be obtained and you will be called to schedule. See instructions below. Return to Heart Failure APP Clinic in 4 weeks - see below.  Please call us at [phone] if any questions or issues prior to your next visit.              SLEEP STUDY  Height:  6'2  Weight: 255.8 lbs BMI:  32.8  Today's Date: 02/21/23  STOP BANG RISK ASSESSMENT S (snore) Have you been told that you snore?     YES/NO   T (tired) Are you often tired, fatigued, or sleepy during the day?   YES/NO  O (obstruction) Do you stop breathing, choke, or gasp during sleep? YES/NO   P (pressure) Do you have or are you being treated for high blood pressure? YES/NO   B (BMI) Is your body index greater than 35 kg/m? YES/NO   A (age) Are you 45 years old or older? YES/NO   N (neck) Do you have a neck circumference greater than 16 inches?   YES/NO   G (gender) Are you a male? YES/NO   TOTAL STOP/BANG "YES" ANSWERS                                                                        For Office Use Only              Procedure Order Form    YES to 3+ Stop Bang questions OR two clinical symptoms - patient qualifies for WatchPAT (CPT 95800)      Clinical Notes: Will consult Sleep Specialist and refer for management of therapy due to patient increased risk of Sleep Apnea. Ordering a sleep study due to the following two clinical symptoms: Excessive daytime sleepiness G47.10 / Gastroesophageal reflux K21.9 / Nocturia R35.1 / Morning Headaches G44.221 / Difficulty concentrating R41.840 / Memory problems or poor judgment G31.84 / Personality changes or irritability R45.4 / Loud snoring R06.83 / Depression  F32.9 / Unrefreshed by sleep G47.8 / Impotence N52.9 / History of high blood pressure R03.0 / Insomnia G47.00          Cardiac Sarcoidosis/Inflammation PET Scan Patient Instructions  Please report to Radiology at the [facility] Main Entrance 15 minutes early for your test.  [address] [place], [place] [address] BRING FOOD DIARY WITH YOU TO THIS APPOINTMENT For 24 hours before the test: Do not exercise! Do not eat after 5 pm the day before your test! To make sure that your scanning results are accurate, you MUST follow the sarcoid prep meal diet starting the day before your PET scan. This diet involves eating no carbohydrates 24 hours before the test.  You will keep a log of all that you eat the day before your test. If you have questions or do not understand this diet, please call [phone] for more information. If you are unable  to follow this diet, please discuss an alternative strategy with the coordinator.  If you are diabetic, continue your diabetes medications as usual on the day before until you begin to fast. NO DIABETES MEDICATIONS ONCE YOU BEGIN TO FAST. What foods can I eat the day before my test?  Drink only water or black coffee (WITHOUT sugar, artificial sweetener, cream, or milk). Eggs (prepared without milk or cheese)  Meat that is either broiled or pan fried in butter WITHOUT breading (chicken, [place], bacon, meat-only sausage, hamburger, steak, fish) Butter, salt & pepper What foods must I AVOID the day before my test?  Do not consume alcoholic beverages, sodas, fruit juice, coffee creamer, or sports drinks  Do not eat vegetables, beans, nuts, fruits, juices, bread, grains, rice, pasta, potatoes, or any baked goods Do not eat dairy products (milk, cheese, etc.)  Do not eat mayonnaise, ketchup, tartar sauce, mustard, or other condiments Do not add sugar, artificial sweeteners, or Splenda (sucralose) to foods or drinks  Do not eat breaded foods  (like fried chicken)  Do not eat sweets, candy, gum, sweetened cough drops, lozenges, or sugar  Do not eat sweetened, grilled, or cured meats or meat with carbohydrate-containing additives (some sausages, ham, sweetened bacon) Suggested items for breakfast, lunch, or dinner:  Breakfast  3 to 5 fatty sausage links fried in butter. 3 to 5 bacon strips.  3 eggs pan fried in butter (no milk or cheese).  Lunch/Dinner  2 hamburger patties fried in butter. Chicken or fatty fish pan fried in butter. No breading. 8 oz. fatty steak pan fried in butter.  Beverages  Drink only water or black coffee. DO NOT ADD SUGAR, ARTIFICIAL SWEETENER, CREAM, OR MILK   For more information and frequently asked questions, please visit our website : [email or web address]    Cardiac Sarcoidosis/Inflammation PET Scan  Food Diary Name: _____________________________ Please fill in EXACTLY what you have eaten and when for 24 hours PRIOR to your test date.  Time Food/Drink Comments  Breakfast                Lunch                Dinner                Snacks                 DO NOT EXERCISE THE DAY BEFORE YOUR TEST DO NOT EAT AFTER 5 PM THE DAY BEFORE YOUR TEST.  ON THE DAY OF YOUR TEST, DO NOT EAT ANY FOOD AND ONLY DRINK CLEAR WATER! PLEASE BRING THIS FOOD DIARY WITH YOU TO YOUR APPOINTMENT

## 2023-02-21 NOTE — Progress Notes (Incomplete)
HEART & VASCULAR TRANSITION OF CARE CONSULT NOTE     Referring Physician: Dr. Herbie Baltimore Primary Care: Dr. Benito Mccreedy Primary Cardiologist: Dr. Odis Hollingshead  HPI: Referred to clinic by Dr. Herbie Baltimore with [facility] for heart failure consultation. 62 y.o. male with history of CAD, COPD, tobacco and ETOH abuse, PAF, HFrEF, medication noncompliance, HTN. Has been followed by Dr. Odis Hollingshead in the past.   Had NSTEMI 05/23. Echo 05/23: EF 40-45%. R/LHC 05/23: Fick CI 3.37, 90% RCA, distal Cx occluded and ulcerated. Treated medically.  Admission with acute on chronic HFrEF, angina and Afib with RVR in 07/24. Echo with EF 35-40%, severe BAE. R/LHC 07/24: 80-90% m RCA, known occlusion Lcx, CI 2.0.  CTA chest 07/24: Mediastinal and bilateral hilar lymph nodes. He saw Dr. Delton Coombes in 08/24. PET CT ordered but denied by insurance.  EMS called 02/04/23 d/t chest pain and diaphoresis. Initial rhythm in the field was Afib followed by reported Vfib arrest requiring CPR and 3 shocks. Available strips appeared polymorphic VT on review by EP. Cardiac cath with 50% m LCX, very distal occlusion LCx and 80-90% RCA (no clear culprit and unchanged from 07/24 angiogram). Echo with EF down to < 20% (previously 35-40%). He underwent ICD placement for secondary prevention of SCD and was started on amiodarone.   Had multiple episodes of VT/VF and received 7 ATPs on 02/08/23. His amiodarone was increased to 400 mg BID X 2 weeks.   He is here today for follow-up. Patient is accompanied by his wife who assists with the history. Reports chronic dyspnea with exertion which is no worse than his baseline. Chest is very sore from CPR and hurts to take a full breath. Denies orthopnea or PND. Occasional lower extremity edema. He snores and wife has witnessed some apneic spells. No dizziness, presyncope or syncope. Taking all medications as prescribed.  Has cut back smoking from 2 ppd to 1 ppd. Previously consumed 6 drinks a day, has not had  any alcohol since discharge.    Past Medical History:  Diagnosis Date   Acute diastolic heart failure (HCC)    Arthritis    Cardiac arrest (HCC)    CHF (congestive heart failure) (HCC)    Headache    Hypertension    Hypertensive urgency     Current Outpatient Medications  Medication Sig Dispense Refill   albuterol (PROVENTIL) (2.5 MG/3ML) 0.083% nebulizer solution Take 3 mLs (2.5 mg total) by nebulization every 6 (six) hours as needed for wheezing or shortness of breath. 75 mL 12   albuterol (VENTOLIN HFA) 108 (90 Base) MCG/ACT inhaler Inhale 1-2 puffs into the lungs every 4 (four) hours as needed for wheezing or shortness of breath. 18 g 0   amiodarone (PACERONE) 200 MG tablet Take 2 tablets (400 mg total) by mouth 2 (two) times daily for 14 days, THEN 1 tablet (200 mg total) 2 (two) times daily. 60 tablet 1   apixaban (ELIQUIS) 5 MG TABS tablet Take 1 tablet (5 mg total) by mouth 2 (two) times daily. Resume SUNDAY, 11/17     aspirin EC 81 MG tablet Take 1 tablet (81 mg total) by mouth daily. Swallow whole. 90 tablet 1   atorvastatin (LIPITOR) 80 MG tablet Take 1 tablet (80 mg total) by mouth daily. 30 tablet 1   dapagliflozin propanediol (FARXIGA) 10 MG TABS tablet Take 1 tablet (10 mg total) by mouth daily. 30 tablet 1   fluticasone (FLONASE) 50 MCG/ACT nasal spray Place 2 sprays into both nostrils daily.  16 g 2   furosemide (LASIX) 40 MG tablet Take 1 tablet (40 mg total) by mouth daily. 30 tablet 1   isosorbide-hydrALAZINE (BIDIL) 20-37.5 MG tablet Take 1 tablet by mouth 3 (three) times daily. 90 tablet 2   metoprolol succinate (TOPROL-XL) 50 MG 24 hr tablet Take 1 tablet (50 mg total) by mouth daily. Take with or immediately following a meal. 30 tablet 1   ranolazine (RANEXA) 500 MG 12 hr tablet Take 1 tablet (500 mg total) by mouth 2 (two) times daily. 60 tablet 1   sacubitril-valsartan (ENTRESTO) 49-51 MG Take 1 tablet by mouth 2 (two) times daily. 60 tablet 6    umeclidinium-vilanterol (ANORO ELLIPTA) 62.5-25 MCG/ACT AEPB Inhale 1 puff into the lungs daily. 60 each 1   No current facility-administered medications for this encounter.    No Known Allergies    Social History   Socioeconomic History   Marital status: Married    Spouse name: Bonita Quin   Number of children: 1   Years of education: Not on file   Highest education level: High school graduate  Occupational History   Occupation: Retired  Tobacco Use   Smoking status: Every Day    Current packs/day: 1.50    Average packs/day: 1.5 packs/day for 0.9 years (1.4 ttl pk-yrs)    Types: Cigarettes    Start date: 1985    Last attempt to quit: 2023   Smokeless tobacco: Never  Vaping Use   Vaping status: Never Used  Substance and Sexual Activity   Alcohol use: Yes    Comment: drinks about 3 days a week   Drug use: Not Currently    Types: Marijuana    Comment: 1 x per week   Sexual activity: Not on file  Other Topics Concern   Not on file  Social History Narrative   ** Merged History Encounter **       Social Determinants of Health   Financial Resource Strain: Not on file  Food Insecurity: Food Insecurity Present (02/06/2023)   Hunger Vital Sign    Worried About Running Out of Food in the Last Year: Sometimes true    Ran Out of Food in the Last Year: Sometimes true  Transportation Needs: No Transportation Needs (02/06/2023)   PRAPARE - Administrator, Civil Service (Medical): No    Lack of Transportation (Non-Medical): No  Physical Activity: Not on file  Stress: Not on file  Social Connections: Not on file  Intimate Partner Violence: Not At Risk (02/06/2023)   Humiliation, Afraid, Rape, and Kick questionnaire    Fear of Current or Ex-Partner: No    Emotionally Abused: No    Physically Abused: No    Sexually Abused: No      Family History  Problem Relation Age of Onset   Ovarian cancer Mother 60       primary peritoneal cancer   Cancer Mother    Diabetes  Mother    Heart disease Mother    Hyperlipidemia Mother    Hypertension Mother    Stroke Mother    Mental illness Mother    Colon polyps Father        58 on last colonoscopy   Heart disease Father    Hyperlipidemia Father    Hypertension Father    Stroke Father    Cancer Sister    Colon cancer Sister 84       cancer of the sigmoid colon   Heart disease Brother  Hypertension Brother    Heart disease Brother    Hyperlipidemia Brother    Hypertension Brother    Down syndrome Paternal Aunt    Cancer Paternal Uncle        cancer of the spine   COPD Maternal Grandmother    Stomach cancer Paternal Grandmother     Vitals:   02/21/23 0911  BP: (!) 190/110  Pulse: 70  SpO2: 97%  Weight: 116 kg (255 lb 12.8 oz)  Height: 6\' 2"  (1.88 m)    PHYSICAL EXAM: General:  Appears older than stated age. No distress.  HEENT: normal Neck: supple. no JVD.  Cor: PMI nondisplaced. Regular rate & rhythm. No rubs, gallops or murmurs. Lungs: Diminished, likely d/t COPD + decreased effort from pain Abdomen: obese, soft, nontender, nondistended.  Extremities: no cyanosis, clubbing, rash, edema Neuro: alert & oriented x 3. Affect pleasant.  ECG 02/13/23: SR 74 bpm, IVCD w/ QRS 120 ms   ASSESSMENT & PLAN: HFrEF/NICM Echo 07/24: EF 35-40, RV okay Polymorphic VT/VF arrest 11/24 s/p CPR and 3 shocks Echo 11/24: EF < 20%, RV severely reduced, severe BAE LHC 11/24: 50% m Cx, occluded distal LCx (unchanged from prior), 80-90% small, diffuse m RCA.  -Etiology not certain. Cardiomyopathy out of proportion to CAD. He does have uncontrolled HTN. Note mediastinal and hilar lymphadenopathy on CTA chest in 07/24. 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 Will order Cardiac PET CT to further evaluate. May eventually need cMRI. NYHA III Does not appear volume overloaded on exam. Suspect COPD contributing to his dyspnea. Continue lasix 40 mg daily. Continue Farxiga  10 mg daily Continue bidil 1 tab TID Continue metoprolol xl 50 mg daily Add back entresto 49/51 mg BID Labs today Check sleep study  CAD -Recent coronary angiogram as above -No angina -Continue aspirin and statin -On metoprolol and ranexa  VT/VF -Polymorphic VT/VF arrest 11/24 s/p CPR and 3 shocks -S/p single chamber [employer] Sci ICD 02/06/23 -Received 7 ATP for VT/VF 11/13 -Amiodarone increased to 400 BID on 11/15, starting taper on 11/29  PAF -No atrial lead on device -Rhythm regular on exam -SR on ECG 11/18 -Continue Eliquis 5 BID  HTN -BP not controlled -Meds adjusted as above  AKI -Scr up to 1.9 during recent admit following cardiac arrest, down to 1.6 at discharge -Baseline 1.3 -Labs today  Snoring -Check sleep study   Referred to HFSW (PCP, Medications, Transportation, ETOH Abuse, Drug Abuse, Insurance, Surveyor, quantity ): No Refer to Pharmacy: No Refer to Home Health: No Refer to Advanced Heart Failure Clinic: Yes Refer to General Cardiology: No, already established  Follow up  4 weeks in HF clinic, will follow along with her Cardiologist, Dr. Odis Hollingshead.

## 2023-02-22 ENCOUNTER — Other Ambulatory Visit (HOSPITAL_COMMUNITY): Payer: Self-pay

## 2023-02-25 DIAGNOSIS — J438 Other emphysema: Secondary | ICD-10-CM | POA: Diagnosis not present

## 2023-02-27 ENCOUNTER — Telehealth: Payer: Self-pay | Admitting: *Deleted

## 2023-02-28 ENCOUNTER — Other Ambulatory Visit (HOSPITAL_COMMUNITY): Payer: Self-pay

## 2023-03-01 ENCOUNTER — Telehealth (HOSPITAL_COMMUNITY): Payer: Self-pay | Admitting: Cardiology

## 2023-03-01 ENCOUNTER — Other Ambulatory Visit (HOSPITAL_COMMUNITY): Payer: Self-pay

## 2023-03-01 NOTE — Telephone Encounter (Signed)
Pt called to report unbearable pain in chest- pains are related to CPR and pad shocks  Advised would forward to provider however ok to contact PCP in the event cardiology does not prescribe pain meds    Please advise

## 2023-03-02 ENCOUNTER — Other Ambulatory Visit: Payer: Self-pay

## 2023-03-02 ENCOUNTER — Encounter (HOSPITAL_COMMUNITY): Payer: Self-pay | Admitting: Emergency Medicine

## 2023-03-02 ENCOUNTER — Emergency Department (HOSPITAL_COMMUNITY): Payer: Medicaid Other

## 2023-03-02 ENCOUNTER — Emergency Department (HOSPITAL_COMMUNITY)
Admission: EM | Admit: 2023-03-02 | Discharge: 2023-03-02 | Discharge status: Against Medical Advice | Payer: Medicaid Other | Attending: Emergency Medicine | Admitting: Emergency Medicine

## 2023-03-02 DIAGNOSIS — Z95810 Presence of automatic (implantable) cardiac defibrillator: Secondary | ICD-10-CM | POA: Diagnosis not present

## 2023-03-02 DIAGNOSIS — Z5321 Procedure and treatment not carried out due to patient leaving prior to being seen by health care provider: Secondary | ICD-10-CM | POA: Insufficient documentation

## 2023-03-02 DIAGNOSIS — M19019 Primary osteoarthritis, unspecified shoulder: Secondary | ICD-10-CM | POA: Diagnosis not present

## 2023-03-02 DIAGNOSIS — J439 Emphysema, unspecified: Secondary | ICD-10-CM | POA: Diagnosis not present

## 2023-03-02 DIAGNOSIS — R0602 Shortness of breath: Secondary | ICD-10-CM | POA: Diagnosis not present

## 2023-03-02 DIAGNOSIS — R079 Chest pain, unspecified: Secondary | ICD-10-CM | POA: Insufficient documentation

## 2023-03-02 DIAGNOSIS — R0789 Other chest pain: Secondary | ICD-10-CM | POA: Diagnosis not present

## 2023-03-02 LAB — CBC WITH DIFFERENTIAL/PLATELET
Abs Immature Granulocytes: 0.1 10*3/uL — ABNORMAL HIGH (ref 0.00–0.07)
Basophils Absolute: 0.1 10*3/uL (ref 0.0–0.1)
Basophils Relative: 1 %
Eosinophils Absolute: 0.2 10*3/uL (ref 0.0–0.5)
Eosinophils Relative: 2 %
HCT: 45.8 % (ref 39.0–52.0)
Hemoglobin: 14.4 g/dL (ref 13.0–17.0)
Immature Granulocytes: 1 %
Lymphocytes Relative: 29 %
Lymphs Abs: 3.2 10*3/uL (ref 0.7–4.0)
MCH: 27.2 pg (ref 26.0–34.0)
MCHC: 31.4 g/dL (ref 30.0–36.0)
MCV: 86.4 fL (ref 80.0–100.0)
Monocytes Absolute: 0.8 10*3/uL (ref 0.1–1.0)
Monocytes Relative: 7 %
Neutro Abs: 6.7 10*3/uL (ref 1.7–7.7)
Neutrophils Relative %: 60 %
Platelets: 321 10*3/uL (ref 150–400)
RBC: 5.3 MIL/uL (ref 4.22–5.81)
RDW: 14.3 % (ref 11.5–15.5)
WBC: 11.1 10*3/uL — ABNORMAL HIGH (ref 4.0–10.5)
nRBC: 0 % (ref 0.0–0.2)

## 2023-03-02 LAB — BRAIN NATRIURETIC PEPTIDE: B Natriuretic Peptide: 43 pg/mL (ref 0.0–100.0)

## 2023-03-02 LAB — BASIC METABOLIC PANEL
Anion gap: 10 (ref 5–15)
BUN: 26 mg/dL — ABNORMAL HIGH (ref 8–23)
CO2: 23 mmol/L (ref 22–32)
Calcium: 9.1 mg/dL (ref 8.9–10.3)
Chloride: 104 mmol/L (ref 98–111)
Creatinine, Ser: 1.5 mg/dL — ABNORMAL HIGH (ref 0.61–1.24)
GFR, Estimated: 52 mL/min — ABNORMAL LOW (ref >60)
Glucose, Bld: 104 mg/dL — ABNORMAL HIGH (ref 70–99)
Potassium: 4.4 mmol/L (ref 3.5–5.1)
Sodium: 137 mmol/L (ref 135–145)

## 2023-03-02 LAB — TROPONIN I (HIGH SENSITIVITY): Troponin I (High Sensitivity): 18 ng/L — ABNORMAL HIGH (ref <18)

## 2023-03-02 MED ORDER — OXYCODONE-ACETAMINOPHEN 5-325 MG PO TABS
2.0000 | ORAL_TABLET | Freq: Once | ORAL | Status: AC
  Administered 2023-03-02: 2 via ORAL
  Filled 2023-03-02: qty 2

## 2023-03-02 NOTE — Telephone Encounter (Signed)
Pt aware.

## 2023-03-02 NOTE — ED Notes (Signed)
Patient left.

## 2023-03-02 NOTE — ED Provider Triage Note (Cosign Needed)
Emergency Medicine Provider Triage Evaluation Note  Michael Bell , a 62 y.o. male  was evaluated in triage.  Pt complains of left-sided chest pain.  He states that he coded in the beginning of 02-07-23 and has been having pain ever since that episode.  He reports associated shortness of breath.  He denies fever or chills.  Review of Systems  Positive:  Negative: See above   Physical Exam  BP (!) 120/90 (BP Location: Right Arm)   Pulse 72   Temp 98.5 F (36.9 C)   Resp 20   SpO2 92%  Gen:   Awake, no distress   Resp:  Normal effort  MSK:   Moves extremities without difficulty  Other:    Medical Decision Making  Medically screening exam initiated at 3:33 PM.  Appropriate orders placed.  [name] was informed that the remainder of the evaluation will be completed by another provider, this initial triage assessment does not replace that evaluation, and the importance of remaining in the ED until their evaluation is complete.     Honor Loh [place], [place] 03/02/23 1537

## 2023-03-02 NOTE — ED Triage Notes (Signed)
Pt presents with sternal chest pain radiating to back.  Pt reports coming in as a "code blue" on Nov 8 and having defibrillator placed.  Pt states that site it very painful as well. Not able to sleep.  Pain has been ongoing since Nov 8. Some SHOB. No n/v/d

## 2023-03-04 ENCOUNTER — Other Ambulatory Visit: Payer: Self-pay | Admitting: Student

## 2023-03-04 ENCOUNTER — Other Ambulatory Visit (HOSPITAL_COMMUNITY): Payer: Self-pay

## 2023-03-06 ENCOUNTER — Other Ambulatory Visit: Payer: Self-pay | Admitting: Student

## 2023-03-06 ENCOUNTER — Other Ambulatory Visit: Payer: Self-pay | Admitting: Internal Medicine

## 2023-03-06 ENCOUNTER — Other Ambulatory Visit (HOSPITAL_COMMUNITY): Payer: Self-pay

## 2023-03-06 ENCOUNTER — Other Ambulatory Visit: Payer: Self-pay | Admitting: Cardiology

## 2023-03-06 ENCOUNTER — Other Ambulatory Visit: Payer: Self-pay

## 2023-03-06 DIAGNOSIS — Z720 Tobacco use: Secondary | ICD-10-CM

## 2023-03-06 DIAGNOSIS — J439 Emphysema, unspecified: Secondary | ICD-10-CM

## 2023-03-06 DIAGNOSIS — I4891 Unspecified atrial fibrillation: Secondary | ICD-10-CM

## 2023-03-06 MED ORDER — APIXABAN 5 MG PO TABS
5.0000 mg | ORAL_TABLET | Freq: Two times a day (BID) | ORAL | 1 refills | Status: DC
  Filled 2023-03-06: qty 180, 90d supply, fill #0
  Filled 2023-03-29 – 2023-06-15 (×2): qty 180, 90d supply, fill #1

## 2023-03-06 MED ORDER — FLUTICASONE PROPIONATE 50 MCG/ACT NA SUSP
2.0000 | Freq: Every day | NASAL | 0 refills | Status: DC
  Filled 2023-03-06: qty 16, 30d supply, fill #0

## 2023-03-06 NOTE — Telephone Encounter (Signed)
Eliquis 5mg  refill request received. Patient is 62 years old, weight-116kg, Crea-1.50 on 03/02/23, Diagnosis-Afib, and last seen by Dr. Odis Hollingshead on 02/13/23. Dose is appropriate based on dosing criteria. Will send in refill to requested pharmacy.

## 2023-03-07 ENCOUNTER — Other Ambulatory Visit: Payer: Self-pay

## 2023-03-07 ENCOUNTER — Other Ambulatory Visit (HOSPITAL_COMMUNITY): Payer: Self-pay

## 2023-03-07 MED ORDER — ALBUTEROL SULFATE HFA 108 (90 BASE) MCG/ACT IN AERS
1.0000 | INHALATION_SPRAY | RESPIRATORY_TRACT | 0 refills | Status: DC | PRN
  Filled 2023-03-07: qty 18, 17d supply, fill #0

## 2023-03-07 MED ORDER — ANORO ELLIPTA 62.5-25 MCG/ACT IN AEPB
1.0000 | INHALATION_SPRAY | Freq: Every day | RESPIRATORY_TRACT | 1 refills | Status: DC
  Filled 2023-03-07: qty 60, 60d supply, fill #0
  Filled 2023-03-29 – 2023-05-25 (×2): qty 60, 60d supply, fill #1

## 2023-03-08 ENCOUNTER — Telehealth (HOSPITAL_COMMUNITY): Payer: Self-pay | Admitting: *Deleted

## 2023-03-08 ENCOUNTER — Other Ambulatory Visit (HOSPITAL_COMMUNITY): Payer: Self-pay

## 2023-03-13 ENCOUNTER — Ambulatory Visit: Payer: Medicaid Other | Admitting: Cardiology

## 2023-03-14 ENCOUNTER — Telehealth (HOSPITAL_COMMUNITY): Payer: Self-pay | Admitting: *Deleted

## 2023-03-14 NOTE — Telephone Encounter (Signed)
Attempted to call patient regarding upcoming cardiac PET appointment. Left message on voicemail with name and callback number  Oza Oberle RN Navigator Cardiac Imaging [facility] [phone] Office [phone] Cell  

## 2023-03-15 ENCOUNTER — Telehealth (HOSPITAL_COMMUNITY): Payer: Self-pay | Admitting: *Deleted

## 2023-03-15 NOTE — Telephone Encounter (Signed)
Reaching out to patient to offer assistance regarding upcoming cardiac imaging study; pt verbalizes understanding of appt date/time, parking situation and where to check in, pre-test NPO status; name and call back number provided for further questions should they arise  Larey Brick RN Navigator Cardiac Imaging [facility] [phone] office [phone] cell  Patient understands diet instructions.

## 2023-03-15 NOTE — Progress Notes (Signed)
Advanced Heart Failure Clinic   Primary Care: Marrianne Mood, MD Primary Cardiologist: Dr. Odis Hollingshead HF Cardiologist: Dr. Gala Romney  CC: HF follow up  HPI: 62 y.o. male with history of CAD, COPD, tobacco and ETOH abuse, PAF, HFrEF, medication noncompliance, HTN. Has been followed by Dr. Odis Hollingshead in the past.   Had NSTEMI 05/23. Echo 05/23: EF 40-45%. R/LHC 05/23: Fick CI 3.37, 90% RCA, distal Cx occluded and ulcerated. Treated medically.  Admitted with a/c HFrEF, angina and Afib with RVR 7/24. Echo with EF 35-40%, severe BAE. R/LHC 7/24: 80-90% m RCA, known occlusion Lcx, CI 2.0.  CTA chest 07/24: Mediastinal and bilateral hilar lymph nodes. He saw Dr. Delton Coombes in 08/24. PET CT ordered, but denied by insurance.  EMS called 02/04/23 d/t chest pain and diaphoresis. Initial rhythm in the field was Afib followed by reported Vfib arrest requiring CPR and 3 shocks. Available strips appeared polymorphic VT on review by EP. Cardiac cath with 50% m LCX, very distal occlusion LCx and 80-90% RCA (no clear culprit and unchanged from 07/24 angiogram). Echo with EF down to < 20% (previously 35-40%). He underwent ICD placement for secondary prevention of SCD and was started on amiodarone.   Had multiple episodes of VT/VF and received 7 ATPs on 02/08/23. His amiodarone was increased to 400 mg BID X 2 weeks.   Seen in TOC 02/21/23, NYHA III and volume stable. GDMT titrated, sleep study and Cardiac PET arranged.  Cardiac PET (12/24) with no evidence of active myocardial inflammation/sarcoidosis, EF 37%, no RWMAs.  Today he returns for HF follow up. Overall feeling poorly. Has constant, sharp mid-sternal CP. Chest hurts when he presses on it and on deep inspiration.  He is SOB walking on flat ground and with ADLs. He is dizzy, had a fall 2 weeks ago, but did not hit his head. Denies palpitations, abnormal bleeding, edema, or PND/Orthopnea. Appetite ok. No fever or chills. Weight at home 252-254 pounds. Taking all  medications. Cut back smoking from 2-3 ppd --> 1 ppd. No ETOH since discharge. Went to ED 03/02/23 with CP. Initial HsTroponin ok, left before further work up completed. Previously worked in [employer] x years.  Past Medical History:  Diagnosis Date   Acute diastolic heart failure (HCC)    Arthritis    Cardiac arrest (HCC)    CHF (congestive heart failure) (HCC)    Headache    Hypertension    Hypertensive urgency    Current Outpatient Medications  Medication Sig Dispense Refill   albuterol (PROVENTIL) (2.5 MG/3ML) 0.083% nebulizer solution Take 3 mLs (2.5 mg total) by nebulization every 6 (six) hours as needed for wheezing or shortness of breath. 75 mL 12   albuterol (VENTOLIN HFA) 108 (90 Base) MCG/ACT inhaler Inhale 1-2 puffs into the lungs every 4 (four) hours as needed for wheezing or shortness of breath. 18 g 0   amiodarone (PACERONE) 200 MG tablet Take 2 tablets (400 mg total) by mouth 2 (two) times daily for 14 days, THEN 1 tablet (200 mg total) 2 (two) times daily. (Patient taking differently: Patient is taking 1 tablet by mouth 3 times a day.) 60 tablet 1   apixaban (ELIQUIS) 5 MG TABS tablet Take 1 tablet (5 mg total) by mouth 2 (two) times daily. 180 tablet 1   aspirin EC 81 MG tablet Take 1 tablet (81 mg total) by mouth daily. Swallow whole. 90 tablet 1   atorvastatin (LIPITOR) 80 MG tablet Take 1 tablet (80 mg total) by mouth daily.  30 tablet 1   dapagliflozin propanediol (FARXIGA) 10 MG TABS tablet Take 1 tablet (10 mg total) by mouth daily. 30 tablet 1   fluticasone (FLONASE) 50 MCG/ACT nasal spray Place 2 sprays into both nostrils daily. 16 g 0   furosemide (LASIX) 40 MG tablet Take 1 tablet (40 mg total) by mouth daily. 30 tablet 1   isosorbide-hydrALAZINE (BIDIL) 20-37.5 MG tablet Take 1 tablet by mouth 3 (three) times daily. 90 tablet 2   metoprolol succinate (TOPROL-XL) 50 MG 24 hr tablet Take 1 tablet (50 mg total) by mouth daily. Take with or immediately  following a meal. 30 tablet 1   ranolazine (RANEXA) 500 MG 12 hr tablet Take 1 tablet (500 mg total) by mouth 2 (two) times daily. 60 tablet 1   sacubitril-valsartan (ENTRESTO) 49-51 MG Take 1 tablet by mouth 2 (two) times daily. 60 tablet 6   umeclidinium-vilanterol (ANORO ELLIPTA) 62.5-25 MCG/ACT AEPB Inhale 1 puff into the lungs daily. 60 each 1   No current facility-administered medications for this encounter.   No Known Allergies  Social History   Socioeconomic History   Marital status: Married    Spouse name: Bonita Quin   Number of children: 1   Years of education: Not on file   Highest education level: High school graduate  Occupational History   Occupation: Retired  Tobacco Use   Smoking status: Every Day    Current packs/day: 1.50    Average packs/day: 1.5 packs/day for 1 year (1.5 ttl pk-yrs)    Types: Cigarettes    Start date: 1985    Last attempt to quit: 2023   Smokeless tobacco: Never  Vaping Use   Vaping status: Never Used  Substance and Sexual Activity   Alcohol use: Yes    Comment: drinks about 3 days a week   Drug use: Not Currently    Types: Marijuana    Comment: 1 x per week   Sexual activity: Not on file  Other Topics Concern   Not on file  Social History Narrative   ** Merged History Encounter **       Social Drivers of Health   Financial Resource Strain: Not on file  Food Insecurity: Food Insecurity Present (02/06/2023)   Hunger Vital Sign    Worried About Running Out of Food in the Last Year: Sometimes true    Ran Out of Food in the Last Year: Sometimes true  Transportation Needs: No Transportation Needs (02/06/2023)   PRAPARE - Administrator, Civil Service (Medical): No    Lack of Transportation (Non-Medical): No  Physical Activity: Not on file  Stress: Not on file  Social Connections: Not on file  Intimate Partner Violence: Not At Risk (02/06/2023)   Humiliation, Afraid, Rape, and Kick questionnaire    Fear of Current or  Ex-Partner: No    Emotionally Abused: No    Physically Abused: No    Sexually Abused: No   Family History  Problem Relation Age of Onset   Ovarian cancer Mother 26       primary peritoneal cancer   Cancer Mother    Diabetes Mother    Heart disease Mother    Hyperlipidemia Mother    Hypertension Mother    Stroke Mother    Mental illness Mother    Colon polyps Father        68 on last colonoscopy   Heart disease Father    Hyperlipidemia Father    Hypertension Father  Stroke Father    Cancer Sister    Colon cancer Sister 5       cancer of the sigmoid colon   Heart disease Brother    Hypertension Brother    Heart disease Brother    Hyperlipidemia Brother    Hypertension Brother    Down syndrome Paternal Aunt    Cancer Paternal Uncle        cancer of the spine   COPD Maternal Grandmother    Stomach cancer Paternal Grandmother     BP (!) 154/104   Pulse 67   Wt 115.6 kg (254 lb 12.8 oz)   SpO2 98%   BMI 32.71 kg/m   Wt Readings from Last 3 Encounters:  03/17/23 115.6 kg (254 lb 12.8 oz)  02/21/23 116 kg (255 lb 12.8 oz)  02/13/23 115.8 kg (255 lb 6.4 oz)   PHYSICAL EXAM: General:  NAD. No resp difficulty, walked into clinic HEENT: Normal Neck: Supple. No JVD. Carotids 2+ bilat; no bruits. No lymphadenopathy or thryomegaly appreciated. Cor: PMI nondisplaced. Regular rate & rhythm. No rubs, gallops or murmurs. + TTP mid-sternum, ICD pocket looks ok, very mild soft tissue swelling around site Lungs: Diminished Abdomen: Soft, obese, nontender, nondistended. No hepatosplenomegaly. No bruits or masses. Good bowel sounds. Extremities: No cyanosis, clubbing, rash, edema Neuro: Alert & oriented x 3, cranial nerves grossly intact. Moves all 4 extremities w/o difficulty. Affect pleasant.  ECG (personally reviewed): NSR 68 bpm  REDs: 33%  Device interrogation (personally reviewed): thoracic impedence ok, average HR 67 bpm, 2.9 hr/day activity  ASSESSMENT &  PLAN: HFrEF/NICM - Echo 07/24: EF 35-40, RV okay - Polymorphic VT/VF arrest 11/24 s/p CPR and 3 shocks - Echo 11/24: EF < 20%, RV severely reduced, severe BAE - LHC 11/24: 50% m Cx, occluded distal LCx (unchanged from prior), 80-90% small, diffuse m RCA.  - Etiology not certain. Cardiomyopathy out of proportion to CAD. He does have uncontrolled HTN. Note mediastinal and hilar lymphadenopathy on CTA chest in 7/24. 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  - Cardiac PET 912/24) showed no evidence of active inflammation or sarcoidosis, EF 37% - May eventually need cMRI. - NYHA III, suspect COPD contributing to dyspnea. volume stable, ReDs 33%.  - Increase Entresto to 97/103 mg bid. - Continue lasix 40 mg daily. - Continue Farxiga 10 mg daily. - Continue BiDil 1 tab tid. - Continue Toprol XL 50 mg daily (consider switch to more beta-1 selective down the road). - Labs today - Repeat echo next visit  2. CAD - LHC as above (RCA is very small co-dominant vessel with diffuse non-critical disease. Not candidate for revascularization)  - Continues with CP, suspect MSK vs pocket pain from ICD insertion. Recent ED eval showed HsTroponin flat, ECG today OK. He has TTP over sternum today on exam, pain worse on inspiration. Check ESR and CRP. - Continue ASA + statin - Continue beta blocker and Ranexa  3. VT/VF - Polymorphic VT/VF arrest 11/24 s/p CPR and 3 shocks - S/p single chamber [employer] Sci ICD 02/06/23 - Received 7 ATP for VT/VF 02/08/23 - Continue amiodarone.  4. PAF - No atrial lead on device. - NSR on ECG today. - Continue Eliquis 5 mg bid - CBC today.  5. HTN - BP not controlled - Increase Entresto as above - Labs today.  6. Hx of AKI - Scr up to 1.9 during recent admit following cardiac arrest, down to 1.6 at discharge - Baseline  1.3. - Labs today  7. Snoring - Check home sleep study.  8. Chest pain - Likely MSK from CPR vs ICD  pocket pain - Check labs - OK to use tylenol - Will send message to device clinic for wound check  9. Lymphadenopathy - Seen on CT 7/24. - No acute metabolic findings on recent PET - Has follow up with Dr. Delton Coombes. Has significant tobacco hx.  Follow up in 2-3 months with Dr. Gala Romney + echo.  Prince Rome, FNP-BC 03/17/23

## 2023-03-16 ENCOUNTER — Encounter (HOSPITAL_COMMUNITY)
Admission: RE | Admit: 2023-03-16 | Discharge: 2023-03-16 | Disposition: A | Discharge status: Home / Self Care | Payer: Medicaid Other | Source: Ambulatory Visit | Attending: Physician Assistant | Admitting: Physician Assistant

## 2023-03-16 DIAGNOSIS — I4901 Ventricular fibrillation: Secondary | ICD-10-CM | POA: Diagnosis not present

## 2023-03-16 DIAGNOSIS — I5020 Unspecified systolic (congestive) heart failure: Secondary | ICD-10-CM | POA: Diagnosis not present

## 2023-03-16 LAB — NM PET CT MYOCARDIAL SARCOIDOSIS
LV dias vol: 174 mL (ref 62–150)
Nuc Stress EF: 37 %
Rest Nuclear Isotope Dose: 29.1 mCi

## 2023-03-16 MED ORDER — RUBIDIUM RB82 GENERATOR (RUBYFILL)
29.8000 | PACK | Freq: Once | INTRAVENOUS | Status: AC
  Administered 2023-03-16: 29.1 via INTRAVENOUS

## 2023-03-16 MED ORDER — FLUDEOXYGLUCOSE F - 18 (FDG) INJECTION
8.9000 | Freq: Once | INTRAVENOUS | Status: AC | PRN
Start: 2023-03-16 — End: 2023-03-16
  Administered 2023-03-16: 8.9 via INTRAVENOUS

## 2023-03-17 ENCOUNTER — Ambulatory Visit (HOSPITAL_COMMUNITY)
Admission: RE | Admit: 2023-03-17 | Discharge: 2023-03-17 | Disposition: A | Discharge status: Home / Self Care | Payer: Medicaid Other | Source: Ambulatory Visit | Attending: Family Medicine | Admitting: Family Medicine

## 2023-03-17 ENCOUNTER — Encounter (HOSPITAL_COMMUNITY): Payer: Self-pay

## 2023-03-17 ENCOUNTER — Other Ambulatory Visit (HOSPITAL_COMMUNITY): Payer: Self-pay

## 2023-03-17 VITALS — BP 154/104 | HR 67 | Wt 254.8 lb

## 2023-03-17 DIAGNOSIS — I10 Essential (primary) hypertension: Secondary | ICD-10-CM

## 2023-03-17 DIAGNOSIS — Z9181 History of falling: Secondary | ICD-10-CM | POA: Diagnosis not present

## 2023-03-17 DIAGNOSIS — I2510 Atherosclerotic heart disease of native coronary artery without angina pectoris: Secondary | ICD-10-CM | POA: Diagnosis not present

## 2023-03-17 DIAGNOSIS — R591 Generalized enlarged lymph nodes: Secondary | ICD-10-CM

## 2023-03-17 DIAGNOSIS — I5022 Chronic systolic (congestive) heart failure: Secondary | ICD-10-CM | POA: Diagnosis not present

## 2023-03-17 DIAGNOSIS — R9431 Abnormal electrocardiogram [ECG] [EKG]: Secondary | ICD-10-CM | POA: Diagnosis not present

## 2023-03-17 DIAGNOSIS — R079 Chest pain, unspecified: Secondary | ICD-10-CM

## 2023-03-17 DIAGNOSIS — R0789 Other chest pain: Secondary | ICD-10-CM | POA: Insufficient documentation

## 2023-03-17 DIAGNOSIS — I4901 Ventricular fibrillation: Secondary | ICD-10-CM | POA: Diagnosis not present

## 2023-03-17 DIAGNOSIS — J449 Chronic obstructive pulmonary disease, unspecified: Secondary | ICD-10-CM | POA: Diagnosis present

## 2023-03-17 DIAGNOSIS — I110 Hypertensive heart disease with heart failure: Secondary | ICD-10-CM | POA: Diagnosis not present

## 2023-03-17 DIAGNOSIS — I252 Old myocardial infarction: Secondary | ICD-10-CM | POA: Diagnosis not present

## 2023-03-17 DIAGNOSIS — R0683 Snoring: Secondary | ICD-10-CM | POA: Diagnosis not present

## 2023-03-17 DIAGNOSIS — I472 Ventricular tachycardia, unspecified: Secondary | ICD-10-CM | POA: Diagnosis not present

## 2023-03-17 DIAGNOSIS — F17210 Nicotine dependence, cigarettes, uncomplicated: Secondary | ICD-10-CM | POA: Insufficient documentation

## 2023-03-17 DIAGNOSIS — I428 Other cardiomyopathies: Secondary | ICD-10-CM | POA: Insufficient documentation

## 2023-03-17 DIAGNOSIS — I480 Paroxysmal atrial fibrillation: Secondary | ICD-10-CM | POA: Diagnosis not present

## 2023-03-17 DIAGNOSIS — R590 Localized enlarged lymph nodes: Secondary | ICD-10-CM | POA: Insufficient documentation

## 2023-03-17 DIAGNOSIS — Z7901 Long term (current) use of anticoagulants: Secondary | ICD-10-CM | POA: Diagnosis not present

## 2023-03-17 LAB — BASIC METABOLIC PANEL
Anion gap: 9 (ref 5–15)
BUN: 21 mg/dL (ref 8–23)
CO2: 23 mmol/L (ref 22–32)
Calcium: 9 mg/dL (ref 8.9–10.3)
Chloride: 105 mmol/L (ref 98–111)
Creatinine, Ser: 1.36 mg/dL — ABNORMAL HIGH (ref 0.61–1.24)
GFR, Estimated: 59 mL/min — ABNORMAL LOW (ref >60)
Glucose, Bld: 107 mg/dL — ABNORMAL HIGH (ref 70–99)
Potassium: 4.4 mmol/L (ref 3.5–5.1)
Sodium: 137 mmol/L (ref 135–145)

## 2023-03-17 LAB — CBC
HCT: 44.2 % (ref 39.0–52.0)
Hemoglobin: 14.6 g/dL (ref 13.0–17.0)
MCH: 28.1 pg (ref 26.0–34.0)
MCHC: 33 g/dL (ref 30.0–36.0)
MCV: 85 fL (ref 80.0–100.0)
Platelets: 210 10*3/uL (ref 150–400)
RBC: 5.2 MIL/uL (ref 4.22–5.81)
RDW: 13.7 % (ref 11.5–15.5)
WBC: 9 10*3/uL (ref 4.0–10.5)
nRBC: 0 % (ref 0.0–0.2)

## 2023-03-17 LAB — SEDIMENTATION RATE: Sed Rate: 20 mm/h — ABNORMAL HIGH (ref 0–16)

## 2023-03-17 LAB — C-REACTIVE PROTEIN: CRP: <0.5 mg/dL (ref <1.0)

## 2023-03-17 LAB — BRAIN NATRIURETIC PEPTIDE: B Natriuretic Peptide: 36.2 pg/mL (ref 0.0–100.0)

## 2023-03-17 MED ORDER — ENTRESTO 97-103 MG PO TABS
1.0000 | ORAL_TABLET | Freq: Two times a day (BID) | ORAL | 3 refills | Status: DC
  Filled 2023-03-17: qty 180, 90d supply, fill #0
  Filled 2023-03-29 – 2023-06-15 (×2): qty 180, 90d supply, fill #1

## 2023-03-17 NOTE — Patient Instructions (Addendum)
EKG done today.   Labs done today. We will contact you only if your labs are abnormal.  INCREASE Entresto to 97-103mg  (1 tablet) by mouth 2 times daily.   No other medication changes were made. Please continue all current medications as prescribed.  Your physician recommends that you schedule a follow-up appointment in: 3 months with Dr.Bensimhon an echo prior to your appointment.   Your physician has requested that you have an echocardiogram. Echocardiography is a painless test that uses sound waves to create images of your heart. It provides your doctor with information about the size and shape of your heart and how well your heart's chambers and valves are working. This procedure takes approximately one hour. There are no restrictions for this procedure. Please do NOT wear cologne, perfume, aftershave, or lotions (deodorant is allowed). Please arrive 15 minutes prior to your appointment time.  Please note: We ask at that you not bring children with you during ultrasound (echo/ vascular) testing. Due to room size and safety concerns, children are not allowed in the ultrasound rooms during exams. Our front office staff cannot provide observation of children in our lobby area while testing is being conducted. An adult accompanying a patient to their appointment will only be allowed in the ultrasound room at the discretion of the ultrasound technician under special circumstances. We apologize for any inconvenience.  If you have any questions or concerns before your next appointment please send [place] a message through [place] or call our office at [phone].    TO LEAVE A MESSAGE FOR THE NURSE SELECT OPTION 2, PLEASE LEAVE A MESSAGE INCLUDING: YOUR NAME DATE OF BIRTH CALL BACK NUMBER REASON FOR CALL**this is important as we prioritize the call backs  YOU WILL RECEIVE A CALL BACK THE SAME DAY AS LONG AS YOU CALL BEFORE 4:00 PM   Do the following things EVERYDAY: Weigh yourself in the morning  before breakfast. Write it down and keep it in a log. Take your medicines as prescribed Eat low salt foods--Limit salt (sodium) to 2000 mg per day.  Stay as active as you can everyday Limit all fluids for the day to less than 2 liters   At the [facility], you and your health needs are our priority. As part of our continuing mission to provide you with exceptional heart care, we have created designated Provider Care Teams. These Care Teams include your primary Cardiologist (physician) and Advanced Practice Providers (APPs- Physician Assistants and Nurse Practitioners) who all work together to provide you with the care you need, when you need it.   You may see any of the following providers on your designated Care Team at your next follow up: Dr Arvilla Meres Dr Marca Ancona Dr. Marcos Eke, NP Robbie Lis, [place] [facility] [place], [place] Brynda Peon, NP Karle Plumber, PharmD   Please be sure to bring in all your medications bottles to every appointment.    Thank you for choosing [facility]

## 2023-03-17 NOTE — Progress Notes (Signed)
ReDS Vest / Clip - 03/17/23 1000       ReDS Vest / Clip   Station Marker C    Ruler Value 30    ReDS Value Range Low volume    ReDS Actual Value 33

## 2023-03-19 ENCOUNTER — Other Ambulatory Visit: Payer: Self-pay | Admitting: Cardiology

## 2023-03-20 ENCOUNTER — Other Ambulatory Visit: Payer: Self-pay

## 2023-03-20 ENCOUNTER — Encounter (HOSPITAL_BASED_OUTPATIENT_CLINIC_OR_DEPARTMENT_OTHER): Payer: Self-pay | Admitting: Emergency Medicine

## 2023-03-20 ENCOUNTER — Emergency Department (HOSPITAL_BASED_OUTPATIENT_CLINIC_OR_DEPARTMENT_OTHER)
Admission: EM | Admit: 2023-03-20 | Discharge: 2023-03-20 | Disposition: A | Discharge status: Home / Self Care | Payer: Medicaid Other | Attending: Emergency Medicine | Admitting: Emergency Medicine

## 2023-03-20 ENCOUNTER — Ambulatory Visit (INDEPENDENT_AMBULATORY_CARE_PROVIDER_SITE_OTHER): Payer: Medicaid Other

## 2023-03-20 ENCOUNTER — Emergency Department (HOSPITAL_BASED_OUTPATIENT_CLINIC_OR_DEPARTMENT_OTHER): Payer: Medicaid Other | Admitting: Radiology

## 2023-03-20 ENCOUNTER — Other Ambulatory Visit (HOSPITAL_COMMUNITY): Payer: Self-pay

## 2023-03-20 DIAGNOSIS — J449 Chronic obstructive pulmonary disease, unspecified: Secondary | ICD-10-CM | POA: Insufficient documentation

## 2023-03-20 DIAGNOSIS — W1839XA Other fall on same level, initial encounter: Secondary | ICD-10-CM | POA: Insufficient documentation

## 2023-03-20 DIAGNOSIS — Z7982 Long term (current) use of aspirin: Secondary | ICD-10-CM | POA: Insufficient documentation

## 2023-03-20 DIAGNOSIS — I2510 Atherosclerotic heart disease of native coronary artery without angina pectoris: Secondary | ICD-10-CM | POA: Diagnosis not present

## 2023-03-20 DIAGNOSIS — S8991XA Unspecified injury of right lower leg, initial encounter: Secondary | ICD-10-CM | POA: Diagnosis not present

## 2023-03-20 DIAGNOSIS — M25561 Pain in right knee: Secondary | ICD-10-CM | POA: Diagnosis not present

## 2023-03-20 DIAGNOSIS — I428 Other cardiomyopathies: Secondary | ICD-10-CM

## 2023-03-20 DIAGNOSIS — M25461 Effusion, right knee: Secondary | ICD-10-CM | POA: Diagnosis not present

## 2023-03-20 DIAGNOSIS — I5022 Chronic systolic (congestive) heart failure: Secondary | ICD-10-CM | POA: Diagnosis not present

## 2023-03-20 DIAGNOSIS — L989 Disorder of the skin and subcutaneous tissue, unspecified: Secondary | ICD-10-CM | POA: Insufficient documentation

## 2023-03-20 DIAGNOSIS — Z7901 Long term (current) use of anticoagulants: Secondary | ICD-10-CM | POA: Insufficient documentation

## 2023-03-20 DIAGNOSIS — Z7951 Long term (current) use of inhaled steroids: Secondary | ICD-10-CM | POA: Insufficient documentation

## 2023-03-20 LAB — CUP PACEART REMOTE DEVICE CHECK
Battery Remaining Longevity: 180 mo
Battery Remaining Percentage: 100 %
Brady Statistic RV Percent Paced: 0 %
Date Time Interrogation Session: 20241223043200
HighPow Impedance: 79 Ohm
Implantable Lead Connection Status: 753985
Implantable Lead Implant Date: 20241111
Implantable Lead Location: 753860
Implantable Lead Model: 138
Implantable Lead Serial Number: 305868
Implantable Pulse Generator Implant Date: 20241111
Lead Channel Impedance Value: 587 Ohm
Lead Channel Pacing Threshold Amplitude: 0.7 V
Lead Channel Pacing Threshold Pulse Width: 0.4 ms
Lead Channel Setting Pacing Amplitude: 3.5 V
Lead Channel Setting Pacing Pulse Width: 0.4 ms
Lead Channel Setting Sensing Sensitivity: 0.6 mV
Pulse Gen Serial Number: 221194
Zone Setting Status: 755011

## 2023-03-20 MED ORDER — DAPAGLIFLOZIN PROPANEDIOL 10 MG PO TABS
10.0000 mg | ORAL_TABLET | Freq: Every day | ORAL | 0 refills | Status: DC
  Filled 2023-03-20 – 2023-03-30 (×2): qty 30, 30d supply, fill #0

## 2023-03-20 MED ORDER — OXYCODONE-ACETAMINOPHEN 5-325 MG PO TABS
1.0000 | ORAL_TABLET | Freq: Once | ORAL | Status: AC
  Administered 2023-03-20: 1 via ORAL
  Filled 2023-03-20: qty 1

## 2023-03-20 NOTE — ED Notes (Signed)
Provider informed of the difference in BP per arm.Marland KitchenMarland Kitchen

## 2023-03-20 NOTE — Discharge Instructions (Addendum)
Evaluation today was overall reassuring.  Recommend you follow-up with general surgery for the lesion on your right knee.  X-ray of your right knee did not indicate fracture or dislocation and the knee itself does not appear to be infected.  Recommend you also follow-up with your PCP for ongoing knee pain.  If you start experience swelling, redness or your knee becomes hot to touch, develop a fever or have inability to ambulate or bear weight on the right leg or any other concerning symptom please return to emergency department for further evaluation.

## 2023-03-20 NOTE — ED Provider Notes (Signed)
[place] EMERGENCY DEPARTMENT AT [facility] Provider Note   CSN: [id number] Arrival date & time: 03/20/23  1134     History  Chief Complaint  Patient presents with   Knee Pain   HPI Michael Bell is a 62 y.o. male with history of HFrEF, NICM, CAD, atrial fibrillation, COPD presenting for right knee pain.  States he fell 2 weeks ago and landed on that right knee but did not hit his head or lose consciousness.  States since then he has had significant pain in that knee.  Also reports that he has had a "bump" on his right knee for "several years.  States that since the fall it has been "oozing". Denies swelling to the knee.  Still able to move the knee joint like he normally does but it is painful to walk prompting his evaluation today.    Knee Pain      Home Medications Prior to Admission medications   Medication Sig Start Date End Date Taking? Authorizing Provider  albuterol (PROVENTIL) (2.5 MG/3ML) 0.083% nebulizer solution Take 3 mLs (2.5 mg total) by nebulization every 6 (six) hours as needed for wheezing or shortness of breath. 11/17/22   Byrum, Les Pou, MD  albuterol (VENTOLIN HFA) 108 (90 Base) MCG/ACT inhaler Inhale 1-2 puffs into the lungs every 4 (four) hours as needed for wheezing or shortness of breath. 03/07/23   Marrianne Mood, MD  amiodarone (PACERONE) 200 MG tablet Take 2 tablets (400 mg total) by mouth 2 (two) times daily for 14 days, THEN 1 tablet (200 mg total) 2 (two) times daily. Patient taking differently: No sig reported 02/10/23 06/03/23  Graciella Freer, PA-C  apixaban (ELIQUIS) 5 MG TABS tablet Take 1 tablet (5 mg total) by mouth 2 (two) times daily. 03/06/23   Tolia, Sunit, DO  aspirin EC 81 MG tablet Take 1 tablet (81 mg total) by mouth daily. Swallow whole. 02/07/23   Arty Baumgartner, NP  atorvastatin (LIPITOR) 80 MG tablet Take 1 tablet (80 mg total) by mouth daily. 02/07/23   Arty Baumgartner, NP  dapagliflozin propanediol  (FARXIGA) 10 MG TABS tablet Take 1 tablet (10 mg total) by mouth daily. 02/07/23   Arty Baumgartner, NP  fluticasone (FLONASE) 50 MCG/ACT nasal spray Place 2 sprays into both nostrils daily. 03/06/23   Marrianne Mood, MD  furosemide (LASIX) 40 MG tablet Take 1 tablet (40 mg total) by mouth daily. 02/07/23 02/07/24  Arty Baumgartner, NP  isosorbide-hydrALAZINE (BIDIL) 20-37.5 MG tablet Take 1 tablet by mouth 3 (three) times daily. 02/07/23 05/08/23  Marykay Lex, MD  metoprolol succinate (TOPROL-XL) 50 MG 24 hr tablet Take 1 tablet (50 mg total) by mouth daily. Take with or immediately following a meal. 02/07/23 05/08/23  Laverda Page B, NP  ranolazine (RANEXA) 500 MG 12 hr tablet Take 1 tablet (500 mg total) by mouth 2 (two) times daily. 02/07/23 05/08/23  Arty Baumgartner, NP  sacubitril-valsartan (ENTRESTO) 97-103 MG Take 1 tablet by mouth 2 (two) times daily. 03/17/23   [place], Anderson Malta, FNP  umeclidinium-vilanterol (ANORO ELLIPTA) 62.5-25 MCG/ACT AEPB Inhale 1 puff into the lungs daily. 03/07/23   Marrianne Mood, MD      Allergies    Patient has no known allergies.    Review of Systems   See HPI  Physical Exam Updated Vital Signs BP 102/79 (BP Location: Right Arm)   Pulse (!) 59   Temp (!) 97.3 F (36.3 C)   Resp 16  SpO2 97%  Physical Exam Vitals and nursing note reviewed.  HENT:     Head: Normocephalic and atraumatic.     Mouth/Throat:     Mouth: Mucous membranes are moist.  Eyes:     General:        Right eye: No discharge.        Left eye: No discharge.     Conjunctiva/sclera: Conjunctivae normal.  Cardiovascular:     Rate and Rhythm: Normal rate and regular rhythm.     Pulses: Normal pulses.     Heart sounds: Normal heart sounds.  Pulmonary:     Effort: Pulmonary effort is normal.     Breath sounds: Normal breath sounds.  Abdominal:     General: Abdomen is flat.     Palpations: Abdomen is soft.  Musculoskeletal:     Right knee: No swelling,  deformity or bony tenderness. Normal range of motion. No tenderness.     Comments: Quarter size cystic-like lesion overlying the right knee.  Nonfluctuant.  Dried blood noted around the lesion but not actively oozing or bleeding.  The knee itself is warm not hot to touch.  Not erythematous or edematous.  Engine motion of the right knee is normal.  Patient is also able to ambulate and bear weight.  Skin:    General: Skin is warm and dry.  Neurological:     General: No focal deficit present.  Psychiatric:        Mood and Affect: Mood normal.     ED Results / Procedures / Treatments   Labs (all labs ordered are listed, but only abnormal results are displayed) Labs Reviewed - No data to display  EKG None  Radiology DG Knee Complete 4 Views Right Result Date: 03/20/2023 CLINICAL DATA:  injury EXAM: RIGHT KNEE - COMPLETE 4 VIEW COMPARISON:  None Available. FINDINGS: No acute fracture, dislocation or subluxation. No osteolytic or osteoblastic changes. Joint spaces are maintained. Small suprapatellar knee joint effusion. IMPRESSION: No acute osseous abnormalities. Electronically Signed   By: Layla Maw M.D.   On: 03/20/2023 12:22    Procedures Procedures    Medications Ordered in ED Medications  oxyCODONE-acetaminophen (PERCOCET/ROXICET) 5-325 MG per tablet 1 tablet (1 tablet Oral Given 03/20/23 1322)    ED Course/ Medical Decision Making/ A&P Clinical Course as of 03/20/23 1419  Mon Mar 20, 2023  1321 BP: 105/75 [JR]    Clinical Course User Index [JR] Gareth Eagle, PA-C                                 Medical Decision Making Amount and/or Complexity of Data Reviewed Radiology: ordered.  Risk Prescription drug management.   62 year old well-appearing male presenting for right knee pain after a fall 2 weeks ago.  Exam notable for cystic-like lesion overlying the right knee but otherwise reassuring.  DDx includes septic arthritis, fracture dislocation, abscess,  other.  I personally reviewed and interpreted x-ray which revealed no acute osseous abnormality.  The lesion on the right nare appears to be a lipoma or other cystic like lesion.  The knee or the lesion does not appear to be infected. Considered septic arthritis but unlikely given normal range of motion of the knee without erythema or edema and not hot to touch and patient is afebrile. Advised him to follow-up with general surgery for the lesion on the right knee.  Discussed return precautions.  Vital stable.  Discharged  in good condition.        Final Clinical Impression(s) / ED Diagnoses Final diagnoses:  Right knee pain, unspecified chronicity  Skin lesion    Rx / DC Orders ED Discharge Orders     None         Caliber, [place], PA-C 03/20/23 1421    Gwyneth Sprout, MD 03/20/23 1519

## 2023-03-20 NOTE — ED Triage Notes (Signed)
Knee pain after fall 2 weeks ago. Right knee

## 2023-03-23 ENCOUNTER — Ambulatory Visit: Payer: Medicaid Other | Attending: Internal Medicine

## 2023-03-23 DIAGNOSIS — I4901 Ventricular fibrillation: Secondary | ICD-10-CM

## 2023-03-23 NOTE — Progress Notes (Signed)
Device function WNL. Some MS pain likely MSK. Pt presents w/ [facility]. MD GT assessed. Ordered pt take extra lasix dose the next three days.

## 2023-03-23 NOTE — Patient Instructions (Signed)
Take an extra dose of lasix for the next three days for a total of 80mg  each day. Thursday 03/23/23 Friday 03/24/23 Saturday 03/24/23.

## 2023-03-27 DIAGNOSIS — J438 Other emphysema: Secondary | ICD-10-CM | POA: Diagnosis not present

## 2023-03-29 ENCOUNTER — Other Ambulatory Visit: Payer: Self-pay | Admitting: Cardiology

## 2023-03-29 ENCOUNTER — Other Ambulatory Visit: Payer: Self-pay | Admitting: Student

## 2023-03-29 ENCOUNTER — Other Ambulatory Visit (HOSPITAL_COMMUNITY): Payer: Self-pay

## 2023-03-30 ENCOUNTER — Other Ambulatory Visit: Payer: Self-pay | Admitting: Cardiology

## 2023-03-30 ENCOUNTER — Encounter (HOSPITAL_COMMUNITY): Payer: Self-pay

## 2023-03-30 ENCOUNTER — Other Ambulatory Visit (HOSPITAL_COMMUNITY): Payer: Self-pay

## 2023-03-30 ENCOUNTER — Other Ambulatory Visit: Payer: Self-pay

## 2023-03-30 ENCOUNTER — Encounter: Payer: Self-pay | Admitting: Internal Medicine

## 2023-03-30 DIAGNOSIS — I5022 Chronic systolic (congestive) heart failure: Secondary | ICD-10-CM

## 2023-03-30 MED ORDER — FUROSEMIDE 40 MG PO TABS
40.0000 mg | ORAL_TABLET | Freq: Every day | ORAL | 3 refills | Status: DC
  Filled 2023-03-30: qty 90, 90d supply, fill #0
  Filled 2023-07-04: qty 90, 90d supply, fill #1

## 2023-03-30 MED ORDER — ATORVASTATIN CALCIUM 80 MG PO TABS
80.0000 mg | ORAL_TABLET | Freq: Every day | ORAL | 3 refills | Status: DC
  Filled 2023-03-30: qty 90, 90d supply, fill #0
  Filled 2023-07-04: qty 90, 90d supply, fill #1

## 2023-03-31 ENCOUNTER — Other Ambulatory Visit (HOSPITAL_COMMUNITY): Payer: Self-pay

## 2023-03-31 MED ORDER — FLUTICASONE PROPIONATE 50 MCG/ACT NA SUSP
2.0000 | Freq: Every day | NASAL | 0 refills | Status: DC
  Filled 2023-03-31: qty 16, 30d supply, fill #0

## 2023-03-31 NOTE — Telephone Encounter (Signed)
 Medication sent to pharmacy

## 2023-04-14 ENCOUNTER — Encounter: Payer: Self-pay | Admitting: Cardiology

## 2023-04-14 ENCOUNTER — Ambulatory Visit: Payer: Medicaid Other | Attending: Cardiology | Admitting: Cardiology

## 2023-04-14 VITALS — BP 130/80 | HR 84 | Resp 16 | Ht 74.0 in | Wt 262.6 lb

## 2023-04-14 DIAGNOSIS — I4819 Other persistent atrial fibrillation: Secondary | ICD-10-CM

## 2023-04-14 DIAGNOSIS — I4901 Ventricular fibrillation: Secondary | ICD-10-CM | POA: Diagnosis not present

## 2023-04-14 DIAGNOSIS — I700 Atherosclerosis of aorta: Secondary | ICD-10-CM

## 2023-04-14 DIAGNOSIS — I428 Other cardiomyopathies: Secondary | ICD-10-CM

## 2023-04-14 DIAGNOSIS — F17200 Nicotine dependence, unspecified, uncomplicated: Secondary | ICD-10-CM

## 2023-04-14 DIAGNOSIS — I5022 Chronic systolic (congestive) heart failure: Secondary | ICD-10-CM

## 2023-04-14 DIAGNOSIS — Z95810 Presence of automatic (implantable) cardiac defibrillator: Secondary | ICD-10-CM

## 2023-04-14 DIAGNOSIS — I10 Essential (primary) hypertension: Secondary | ICD-10-CM

## 2023-04-14 DIAGNOSIS — Z79899 Other long term (current) drug therapy: Secondary | ICD-10-CM

## 2023-04-14 DIAGNOSIS — I469 Cardiac arrest, cause unspecified: Secondary | ICD-10-CM

## 2023-04-14 DIAGNOSIS — I2510 Atherosclerotic heart disease of native coronary artery without angina pectoris: Secondary | ICD-10-CM | POA: Diagnosis not present

## 2023-04-14 DIAGNOSIS — Z7901 Long term (current) use of anticoagulants: Secondary | ICD-10-CM

## 2023-04-14 NOTE — Progress Notes (Unsigned)
Cardiology Office Note:  .   Date:  04/14/2023  ID:  Michael Bell, DOB 1960-07-27, MRN [id number] PCP:  Marrianne Mood, MD  Former Cardiology Providers: None [facility] Providers Cardiologist:  Tessa Lerner, DO, [facility] (established care 08/04/2021)  Electrophysiologist:  Lewayne Bunting, MD  Electrophysiologist:  Lewayne Bunting, MD  Click to update primary MD,subspecialty MD or APP then REFRESH:1}    Chief Complaint  Patient presents with   heart failure with reduced ejection fraction   Follow-up    History of Present Illness: .   Michael Bell is a 63 y.o. Caucasian male whose past medical history and cardiovascular risk factors includes: Status post V-fib arrest, ischemic cardiomyopathy, chronic HFrEF, paroxysmal atrial fibrillation, NSTEMI (May 2023), hypertension, smoker (started at the age of 15 averaging 2 to 3 packs/day until the age of 25 now down to 1 packs/day), excessive alcohol consumption, marijuana use, coronary and aortic atherosclerosis (nongated CT study 12/2020), emphysema, infrarenal aortic aneurysm.   In May 2023 patient ruled in for NSTEMI underwent angiography and was noted to have disease in the RCA and L PDA see angiography results for more details.  Given his cardiomyopathy and HFrEF shared decision was to uptitrate GDMT.  However he was lost to follow-up until January 2024 and he has been out of majority of his medications in the interim due to loss of insurance.  He was hospitalized in July 2024 for worsening dyspnea.  At the last office visit the shared decision was to uptitrate GDMT.  However, in the interim, he presented to the hospital again in November 2024 due to out-of-hospital cardiac arrest due to polymorphic VT/PVC.  He underwent left heart catheterization, EP evaluation, and ICD implant.  He presents today for follow-up accompanied by his wife.  Status post discharge she is doing well from a cardiac standpoint.  Denies any anginal chest pain, heart  failure symptoms, near-syncope or syncopal events.  Reviewed the discharge summary from 02/07/2023 and the ICD alerts from 02/10/2023.  Patient is currently on amiodarone 400 mg p.o. twice daily with plans of transitioning to 200 mg p.o. twice daily in 2 weeks.  1ppd Has not had the sleep yet   Review of Systems: .   Review of Systems  Cardiovascular:  Positive for dyspnea on exertion. Negative for chest pain, claudication, irregular heartbeat, leg swelling, near-syncope, orthopnea, palpitations, paroxysmal nocturnal dyspnea and syncope.  Respiratory:  Negative for shortness of breath.   Hematologic/Lymphatic: Negative for bleeding problem.    Studies Reviewed:   EKG: EKG Interpretation Date/Time:  Friday April 14 2023 10:13:33 EST Text Interpretation: Normal sinus rhythm Left axis deviation Non-specific intra-ventricular conduction delay Minimal voltage criteria for LVH, may be normal variant ( Cornell product ) When compared with ECG of 17-Mar-2023 10:16, No significant change was found Confirmed by Tessa Lerner [phone]) on 04/14/2023 10:28:21 AM  Echocardiogram: November 2024  1. Left ventricular ejection fraction, by estimation, is <20%. The left  ventricle has severely decreased function. The left ventricle demonstrates  global hypokinesis. The left ventricular internal cavity size was mildly  dilated. There is mild concentric   left ventricular hypertrophy. Left ventricular diastolic parameters are  consistent with Grade II diastolic dysfunction (pseudonormalization).  Elevated left atrial pressure.   2. Right ventricular systolic function is severely reduced. The right  ventricular size is severely enlarged. The estimated right ventricular  systolic pressure is 41.2 mmHg.   3. Left atrial size was severely dilated.   4. Right atrial size  was severely dilated.   5. The mitral valve is normal in structure. No evidence of mitral valve  regurgitation.   6. Tricuspid valve  regurgitation is mild to moderate.   7. The aortic valve is tricuspid. There is mild thickening of the aortic  valve. Aortic valve regurgitation is not visualized. No aortic stenosis is  present.   8. The inferior vena cava is dilated in size with <50% respiratory  variability, suggesting right atrial pressure of 15 mmHg.   Coronary angiography 02/04/2023: LM: Normal LAD: No significant disease Ramus: No significant disease Lcx: Large, codominant. Mid 50% disease, followed by very distal occlusion (unchanged since 07/2021) RCA: Small, codominant. Diffuse 80-90% mid vessel disease   Coronary angiogram unchanged compared to 10/20/2022  PET CT Myocardial Sarcoidosis  03/16/2023   FDG uptake was not observed. LV perfusion is normal. There is no evidence of infarction.   Left ventricular function is abnormal. Global function is moderately reduced. There were no regional wall motion abnormalities. EF: 37%. End diastolic cavity size is moderately enlarged.   Coronary calcium was present on the attenuation correction CT images. Moderate coronary calcifications were present. Coronary calcifications were present in the left anterior descending artery distribution(s).   FDG uptake findings are inconsistent with active myocardial inflammation/sarcoidosis  RADIOLOGY: NA  Risk Assessment/Calculations:   Click Here to Calculate/Change CHADS2VASc Score The patient's CHADS2-VASc score is 3, indicating a 3.2% annual risk of stroke.   CHF History: Yes HTN History: Yes Diabetes History: No Stroke History: No Vascular Disease History: Yes  Labs:       Latest Ref Rng & Units 03/17/2023   10:27 AM 03/02/2023    3:45 PM 02/07/2023    7:50 AM  CBC  WBC 4.0 - 10.5 K/uL 9.0  11.1  10.7   Hemoglobin 13.0 - 17.0 g/dL 16.1  09.6  04.5   Hematocrit 39.0 - 52.0 % 44.2  45.8  38.0   Platelets 150 - 400 K/uL 210  321  174        Latest Ref Rng & Units 03/17/2023   10:27 AM 03/02/2023    3:45 PM  02/21/2023    9:47 AM  BMP  Glucose 70 - 99 mg/dL 409  811  91   BUN 8 - 23 mg/dL 21  26  24    Creatinine 0.61 - 1.24 mg/dL 9.14  7.82  9.56   Sodium 135 - 145 mmol/L 137  137  135   Potassium 3.5 - 5.1 mmol/L 4.4  4.4  4.6   Chloride 98 - 111 mmol/L 105  104  105   CO2 22 - 32 mmol/L 23  23  23    Calcium 8.9 - 10.3 mg/dL 9.0  9.1  9.1       Latest Ref Rng & Units 03/17/2023   10:27 AM 03/02/2023    3:45 PM 02/21/2023    9:47 AM  CMP  Glucose 70 - 99 mg/dL 213  086  91   BUN 8 - 23 mg/dL 21  26  24    Creatinine 0.61 - 1.24 mg/dL 5.78  4.69  6.29   Sodium 135 - 145 mmol/L 137  137  135   Potassium 3.5 - 5.1 mmol/L 4.4  4.4  4.6   Chloride 98 - 111 mmol/L 105  104  105   CO2 22 - 32 mmol/L 23  23  23    Calcium 8.9 - 10.3 mg/dL 9.0  9.1  9.1   Total Protein 6.5 -  8.1 g/dL   7.5   Total Bilirubin <1.2 mg/dL   0.4   Alkaline Phos 38 - 126 U/L   90   AST 15 - 41 U/L   24   ALT 0 - 44 U/L   29     Lab Results  Component Value Date   CHOL 121 02/05/2023   HDL 43 02/05/2023   LDLCALC 71 02/05/2023   LDLDIRECT 93.6 08/04/2021   TRIG 34 02/05/2023   CHOLHDL 2.8 02/05/2023   No results for input(s): "LIPOA" in the last 8760 hours. No components found for: "NTPROBNP" No results for input(s): "PROBNP" in the last 8760 hours. Recent Labs    10/18/22 0116  TSH 1.425   Physical Exam:    Today's Vitals   04/14/23 1010  BP: 130/80  Pulse: 84  Resp: 16  SpO2: 97%  Weight: 262 lb 9.6 oz (119.1 kg)  Height: 6\' 2"  (1.88 m)   Body mass index is 33.72 kg/m. Wt Readings from Last 3 Encounters:  04/14/23 262 lb 9.6 oz (119.1 kg)  03/17/23 254 lb 12.8 oz (115.6 kg)  02/21/23 255 lb 12.8 oz (116 kg)    Physical Exam  Constitutional: He appears chronically ill.  hemodynamically stable  Neck: No JVD present.  Cardiovascular: Normal rate, regular rhythm, S1 normal and S2 normal. Exam reveals no gallop, no S3 and no S4.  No murmur heard. ICD in place to the left  infraclavicular region.  Steri-Strips present.  No hematoma or signs for infection  Pulmonary/Chest: Effort normal. No stridor. He has no wheezes. He has no rales.  Decreased breath sounds bilaterally.  Abdominal: Soft. Bowel sounds are normal. He exhibits no distension. There is no abdominal tenderness.  Musculoskeletal:        General: No edema.     Cervical back: Neck supple.  Neurological: He is alert and oriented to person, place, and time. He has intact cranial nerves (2-12).  Skin: Skin is warm.     Impression & Recommendation(s):  Impression:   ICD-10-CM   1. Chronic systolic congestive heart failure (HCC)  I50.22 EKG 12-Lead    2. Nonischemic cardiomyopathy (HCC)  I42.8     3. Coronary artery disease involving native coronary artery of native heart without angina pectoris  I25.10     4. Ventricular fibrillation (HCC)  I49.01     5. Cardiac arrest with ventricular fibrillation (HCC)  I46.9    I49.01     6. AICD (automatic cardioverter/defibrillator) present  Z95.810     7. Persistent atrial fibrillation (HCC)  I48.19     8. Long term current use of antiarrhythmic drug  Z79.899     9. Long term (current) use of anticoagulants  Z79.01     10. Benign hypertension  I10     11. Atherosclerosis of aorta (HCC)  I70.0     12. Tobacco dependence  F17.200        Recommendation(s):  Chronic HFrEF (heart failure with reduced ejection fraction) (HCC) Ischemic cardiomyopathy Coronary artery disease involving native coronary artery of native heart without angina pectoris Stage C, NYHA class II/III. Hemodynamically stable. Continue Farxiga 10 mg p.o. daily. Continue Lasix 40 mg p.o. daily. Continue BiDil 1 tablet 3 times daily. Continue Toprol-XL 50 mg p.o. daily. Continue Ranexa 500 mg p.o. twice daily Once renal function is back to baseline would like to transition him from BiDil to [facility] and spironolactone. Given his recent ICD alerts would like to focus on  managing  his NSVT/VT and give some time for renal improvement as well. EKG today is nonischemic  AKI (acute kidney injury) (HCC) Baseline creatinine around 1.3 mg/dL. Creatinine at discharge 1.6 mg/dL. Continue current medical therapy. Will recheck labs prior to the next office visit.  Cardiac arrest with ventricular fibrillation [facility]) Long term current use of antiarrhythmic drug Presented to the hospital in November 2024 with V-fib arrest Has undergone appropriate cardiac workup -now status post ICD implant for secondary prevention.   Since discharge patient has had ICD alerts and therefore amiodarone was increased from 200 mg p.o. twice daily to 400 mg p.o. twice daily for additional 2 weeks.  Thereafter he will transition back to 200 mg p.o. twice daily He has a device check appointment later this week.  Paroxysmal atrial fibrillation (HCC) Long term (current) use of anticoagulants Rate control: Metoprolol. Rhythm control: Amiodarone. Thromboembolic prophylaxis: Eliquis Does not endorse evidence of bleeding.  Will check H&H prior to the next office visit.  Benign hypertension Office blood pressures are well-controlled. Medication changes as discussed above  Atherosclerosis of aorta (HCC) Continue aspirin and statin therapy.  Tobacco dependence Tobacco cessation counseling: Currently smoking 1 packs/day   Patient denies claudication Patient is informed to follow-up with PCP and consider lung cancer screening if and when appropriate. Consider screening for peripheral artery disease with ankle-brachial index-defer to PCP.  He is informed of the dangers of tobacco abuse including stroke, cancer, and MI, as well as benefits of tobacco cessation. He is willing to quit at this time. 5 mins were spent counseling patient cessation techniques. We discussed various methods to help quit smoking, including deciding on a date to quit, joining a support group, pharmacological agents-  nicotine gum/patch/lozenges.  I will reassess his progress at the next follow-up visit  Alcohol use Reemphasized importance of consuming no more than 2 standard drinks per day.  Orders Placed:  Orders Placed This Encounter  Procedures   EKG 12-Lead    As part of medical decision making discharge summary November 2024, echocardiogram results November 2024, heart catheterization report November 2024, ICD implant and device alerts from November 2024, EKG performed and independently reviewed, labs from November 2024, were reviewed at today's office visit total time spent 42 minutes.  Final Medication List:   No orders of the defined types were placed in this encounter.   There are no discontinued medications.   Current Outpatient Medications:    albuterol (PROVENTIL) (2.5 MG/3ML) 0.083% nebulizer solution, Take 3 mLs (2.5 mg total) by nebulization every 6 (six) hours as needed for wheezing or shortness of breath., Disp: 75 mL, Rfl: 12   albuterol (VENTOLIN HFA) 108 (90 Base) MCG/ACT inhaler, Inhale 1-2 puffs into the lungs every 4 (four) hours as needed for wheezing or shortness of breath., Disp: 18 g, Rfl: 0   amiodarone (PACERONE) 200 MG tablet, Take 2 tablets (400 mg total) by mouth 2 (two) times daily for 14 days, THEN 1 tablet (200 mg total) 2 (two) times daily. (Patient taking differently: No sig reported), Disp: 60 tablet, Rfl: 1   apixaban (ELIQUIS) 5 MG TABS tablet, Take 1 tablet (5 mg total) by mouth 2 (two) times daily., Disp: 180 tablet, Rfl: 1   aspirin EC 81 MG tablet, Take 1 tablet (81 mg total) by mouth daily. Swallow whole., Disp: 90 tablet, Rfl: 1   atorvastatin (LIPITOR) 80 MG tablet, Take 1 tablet (80 mg total) by mouth daily., Disp: 90 tablet, Rfl: 3   dapagliflozin propanediol (FARXIGA) 10  MG TABS tablet, Take 1 tablet (10 mg total) by mouth daily. Please keep upcoming appointment in January 2025 for future refills. Thank you, Disp: 30 tablet, Rfl: 0   fluticasone  (FLONASE) 50 MCG/ACT nasal spray, Place 2 sprays into both nostrils daily., Disp: 16 g, Rfl: 0   furosemide (LASIX) 40 MG tablet, Take 1 tablet (40 mg total) by mouth daily., Disp: 90 tablet, Rfl: 3   isosorbide-hydrALAZINE (BIDIL) 20-37.5 MG tablet, Take 1 tablet by mouth 3 (three) times daily., Disp: 90 tablet, Rfl: 2   metoprolol succinate (TOPROL-XL) 50 MG 24 hr tablet, Take 1 tablet (50 mg total) by mouth daily. Take with or immediately following a meal., Disp: 30 tablet, Rfl: 1   ranolazine (RANEXA) 500 MG 12 hr tablet, Take 500 mg by mouth 2 (two) times daily., Disp: , Rfl:    sacubitril-valsartan (ENTRESTO) 97-103 MG, Take 1 tablet by mouth 2 (two) times daily., Disp: 180 tablet, Rfl: 3   umeclidinium-vilanterol (ANORO ELLIPTA) 62.5-25 MCG/ACT AEPB, Inhale 1 puff into the lungs daily., Disp: 60 each, Rfl: 1   ranolazine (RANEXA) 500 MG 12 hr tablet, Take 1 tablet (500 mg total) by mouth 2 (two) times daily., Disp: 60 tablet, Rfl: 1  Consent:   N/A  Disposition:   1 month sooner if needed Patient may be asked to follow-up sooner based on the results of the above-mentioned testing.  His questions and concerns were addressed to his satisfaction. He voices understanding of the recommendations provided during this encounter.    Signed, Tessa Lerner, DO, [facility] [place]  [facility]  [address] [place], [place] [address] 04/14/2023 10:45 AM

## 2023-04-14 NOTE — Patient Instructions (Signed)
Follow-Up: At [facility], you and your health needs are our priority.  As part of our continuing mission to provide you with exceptional heart care, we have created designated Provider Care Teams.  These Care Teams include your primary Cardiologist (physician) and Advanced Practice Providers (APPs -  Physician Assistants and Nurse Practitioners) who all work together to provide you with the care you need, when you need it.  We recommend signing up for the patient portal called "MyChart".  Sign up information is provided on this After Visit Summary.  MyChart is used to connect with patients for Virtual Visits (Telemedicine).  Patients are able to view lab/test results, encounter notes, upcoming appointments, etc.  Non-urgent messages can be sent to your provider as well.   To learn more about what you can do with MyChart, go to [email or web address].    Your next appointment:   6 month(s)  Provider:   Tessa Lerner, DO     Other Instructions  We will reach out to you about the Itamar Sleep Study    1st Floor: - Lobby - Registration  - Pharmacy  - Lab - Cafe  2nd Floor: - PV Lab - Diagnostic Testing (echo, CT, nuclear med)  3rd Floor: - Vacant  4th Floor: - TCTS (cardiothoracic surgery) - AFib Clinic - Structural Heart Clinic - Vascular Surgery  - Vascular Ultrasound  5th Floor: - HeartCare Cardiology (general and EP) - Clinical Pharmacy for coumadin, hypertension, lipid, weight-loss medications, and med management appointments    Valet parking services will be available as well.

## 2023-04-16 ENCOUNTER — Encounter: Payer: Self-pay | Admitting: Cardiology

## 2023-04-17 ENCOUNTER — Telehealth: Payer: Self-pay

## 2023-04-17 NOTE — Telephone Encounter (Signed)
-----   Message from Nurse Zelphia Cairo sent at 04/17/2023 12:05 PM EST ----- Regarding: RE: Velora Heckler! So we need to make sure he doesn't need a new prior auth since it has been a little bit since it was originally ordered. If Coralee North says it is clear to go ahead and move forward with, then we can tell him! ----- Message ----- From: Erick Alley, RN Sent: 04/17/2023   9:39 AM EST To: Cherylann Banas, RN Subject: Annell Greening: Vernon Prey, we messaged sleep studies about this patient last week. He has all of the [place] equipment and sleep studies says he does not need a PA. Do I call him and let him know he can go ahead and use it? Thanks! Rayfield Citizen ----- Message ----- From: Brunetta Genera, LPN Sent: 3/66/4403   9:32 AM EST To: Erick Alley, RN Subject: RE: Donnie Coffin                                     He does not need a PA. Do you need me to do anything? ----- Message ----- From: Erick Alley, RN Sent: 04/14/2023  10:56 AM EST To: Reesa Chew, CMA; Cv Div Sleep Studies Subject: Itamar                                         Heart failure had wanted this patient to have the Itamar done and he has all of the equipment. Does he need a pre auth or does he need a new order?

## 2023-04-17 NOTE — Telephone Encounter (Signed)
Spoke with patient about the Itamar sleep study. Patient stated he received the Itamar device from [facility] not [facility]. Patient was told to follow up with them to continue with the study as there is no order in our system and we did not give him the device. The patient verbalized understanding.  All questions (if any) were answered. Erick Alley, RN 04/17/2023 2:13 PM

## 2023-04-25 NOTE — Progress Notes (Signed)
Remote ICD transmission.

## 2023-04-26 ENCOUNTER — Other Ambulatory Visit: Payer: Self-pay | Admitting: Cardiology

## 2023-04-26 ENCOUNTER — Telehealth: Payer: Self-pay | Admitting: *Deleted

## 2023-04-26 ENCOUNTER — Other Ambulatory Visit (HOSPITAL_COMMUNITY): Payer: Self-pay

## 2023-04-26 DIAGNOSIS — I5022 Chronic systolic (congestive) heart failure: Secondary | ICD-10-CM

## 2023-04-26 DIAGNOSIS — Z87891 Personal history of nicotine dependence: Secondary | ICD-10-CM

## 2023-04-26 DIAGNOSIS — R0683 Snoring: Secondary | ICD-10-CM

## 2023-04-26 MED ORDER — SPIRONOLACTONE 25 MG PO TABS
12.5000 mg | ORAL_TABLET | Freq: Every day | ORAL | 3 refills | Status: DC
  Filled 2023-04-26: qty 45, 90d supply, fill #0
  Filled 2023-08-01: qty 45, 90d supply, fill #1

## 2023-04-26 NOTE — Telephone Encounter (Signed)
-----   Message from Nurse Vena Austria sent at 04/14/2023 10:53 AM EST ----- Regarding: Itamar Heart failure had wanted this patient to have the Itamar done and he has all of the equipment. Does he need a pre auth or does he need a new order?

## 2023-04-27 DIAGNOSIS — J438 Other emphysema: Secondary | ICD-10-CM | POA: Diagnosis not present

## 2023-05-11 ENCOUNTER — Telehealth: Payer: Self-pay | Admitting: Cardiology

## 2023-05-11 NOTE — Telephone Encounter (Signed)
Patient was calling about his sleep study result. Informed him the result is not in yet and we will call him once the doctor results it.

## 2023-05-11 NOTE — Telephone Encounter (Signed)
What problem are you experiencing? no  Who is your medical equipment company?   3)    If patient is calling about their sleep study results please route to CV DIV Sleep Study Pool. yes   Please route to the sleep study coordinator.

## 2023-05-11 NOTE — Telephone Encounter (Signed)
Ordering provider: JESSICA  MILFORD Associated diagnoses: R06.83 WatchPAT PA obtained on 05/11/2023 by Latrelle Dodrill, CMA. Authorization: No; tracking ID NO AUTH REQ Patient notified of PIN ([id number]) on 05/11/2023 via Notification Method: phone.  Phone note routed to covering staff for follow-up.  Instructions for covering staff:  Please contact patient in 2 weeks if WatchPAT study results are not available yet. Remind patient to complete test.  If patient declines to proceed with test, please confirm that box is unopened and remind patient to return it to the office within 30 days. Route phone note to CV DIV SLEEP STUDIES pool for tracking.  If box has been opened, please route phone note to CV DIV SLEEP STUDIES pool to have device de-initialized and processed for billing.

## 2023-05-12 ENCOUNTER — Other Ambulatory Visit: Payer: Self-pay

## 2023-05-12 DIAGNOSIS — R0683 Snoring: Secondary | ICD-10-CM

## 2023-05-12 DIAGNOSIS — I10 Essential (primary) hypertension: Secondary | ICD-10-CM

## 2023-05-12 DIAGNOSIS — Z87891 Personal history of nicotine dependence: Secondary | ICD-10-CM

## 2023-05-12 DIAGNOSIS — I2510 Atherosclerotic heart disease of native coronary artery without angina pectoris: Secondary | ICD-10-CM

## 2023-05-12 DIAGNOSIS — I428 Other cardiomyopathies: Secondary | ICD-10-CM

## 2023-05-12 DIAGNOSIS — I5020 Unspecified systolic (congestive) heart failure: Secondary | ICD-10-CM

## 2023-05-12 DIAGNOSIS — I5022 Chronic systolic (congestive) heart failure: Secondary | ICD-10-CM

## 2023-05-12 NOTE — Progress Notes (Signed)
Notified patient of sleep study results and recommendations. All questions were answered and patient verbalized understanding. Poor tracing on Home Sleep Test, In lab PSG ordered today.

## 2023-05-19 ENCOUNTER — Encounter: Payer: Self-pay | Admitting: Emergency Medicine

## 2023-05-19 ENCOUNTER — Ambulatory Visit: Payer: Medicaid Other | Admitting: Emergency Medicine

## 2023-05-19 VITALS — BP 150/85 | HR 69 | Ht 74.0 in | Wt 264.0 lb

## 2023-05-19 DIAGNOSIS — J439 Emphysema, unspecified: Secondary | ICD-10-CM | POA: Diagnosis not present

## 2023-05-19 DIAGNOSIS — Z720 Tobacco use: Secondary | ICD-10-CM

## 2023-05-19 DIAGNOSIS — J301 Allergic rhinitis due to pollen: Secondary | ICD-10-CM

## 2023-05-19 DIAGNOSIS — J309 Allergic rhinitis, unspecified: Secondary | ICD-10-CM | POA: Insufficient documentation

## 2023-05-19 MED ORDER — TRELEGY ELLIPTA 100-62.5-25 MCG/ACT IN AEPB: 1.0000 | INHALATION_SPRAY | Freq: Every day | RESPIRATORY_TRACT | Status: DC

## 2023-05-19 NOTE — Assessment & Plan Note (Signed)
Continue your fluticasone nasal spray, 2 sprays each nostril once daily. °

## 2023-05-19 NOTE — Assessment & Plan Note (Signed)
Identified in July 2024 when he was admitted with volume overload.  Resolved on subsequent myocardial PET scan.  No indication for further imaging or biopsy.  Reassured him about this.

## 2023-05-19 NOTE — Assessment & Plan Note (Signed)
Temporarily stop your Anoro.  We will try starting Trelegy 1 inhalation once daily to see if you tolerate and whether this helps with your breathing and your mucus production.  We will give you samples.  If you benefit then please call our office so we can send a prescription for Trelegy to your pharmacy. Keep your albuterol available to use either 2 puffs or 1 nebulizer treatment up to every 4 hours if needed for shortness of breath, chest tightness, wheezing. Follow with Dr Delton Coombes in 6 months or sooner if you have any problems

## 2023-05-19 NOTE — Patient Instructions (Signed)
Temporarily stop your Anoro.  We will try starting Trelegy 1 inhalation once daily to see if you tolerate and whether this helps with your breathing and your mucus production.  We will give you samples.  If you benefit then please call our office so we can send a prescription for Trelegy to your pharmacy. Keep your albuterol available to use either 2 puffs or 1 nebulizer treatment up to every 4 hours if needed for shortness of breath, chest tightness, wheezing. We reviewed your PET scan done in December.  Your enlarged lymph nodes have resolved and were likely due to fluid overload.  Good news. Congratulations on decreasing your cigarettes.  Please continue to work on slowly decreasing. Continue your fluticasone nasal spray, 2 sprays each nostril once daily Follow with Dr Delton Coombes in 6 months or sooner if you have any problems

## 2023-05-19 NOTE — Assessment & Plan Note (Signed)
He has significantly decreased his cigarettes and is motivated to decrease more.  We will continue to support him in this.  After he cuts down further we may be able to consider setting a quit date.

## 2023-05-19 NOTE — Progress Notes (Signed)
Subjective:    Patient ID: Michael Bell, male    DOB: 01-01-1961, 63 y.o.   MRN: [id number]  HPI 63 year old man with a history of tobacco use (100 pack years), CAD/STEMI, hypertension and atrial fibrillation with HFpEF.  He was recently admitted in late July 2024 with a CHF exacerbation in the setting of atrial fibrillation with RVR.  He required diuresis.  He is on metoprolol, amiodarone, Eliquis, Entresto, spironolactone. Lasix added back 2 weeks.  He carries a diagnosis of COPD and was discharged on Anoro.  He has had snoring and we discussed possible PSG while he was in the hospital. Today he reports some SOB with exertion. He is using albuterol 3-4x a day with mixed response - doesn't always help. Some daily cough w white mucous.   CT-PA 10/18/2022 reviewed by me showed no evidence of PE, mediastinal lymph nodes including right paratracheal lymph node 3 cm (larger than 2022), some subcarinal adenopathy, left hilar and left AP window nodes.  He had centrilobular and paraseptal emphysema with small bilateral pleural effusions some associated atelectasis.  ROV 05/19/2023 --[name] is 48 with a longstanding history of tobacco use (100 pack years).  He reports that he is cut down to 1 pack daily which is a significant decrease.  He also has CAD/MI, hypertension, A-fib with HFpEF (metoprolol, amiodarone, Entresto, Aldactone, amiodarone).  I have followed him for COPD and for mediastinal adenopathy noted on CT chest when he was admitted with volume overload.  A subsequent PET/CT to evaluate for myocardial sarcoid was done on 03/16/2023 that showed resolution of his mediastinal lymphadenopathy.  There were no enlarged mediastinal hilar or axillar nodes seen.  No infiltrates.  His LVEF was 37%.  There was no evidence to support active myocardial inflammatory sarcoidosis. He had an AICD placed following VT arrest 04/05/22.  Currently managed on Flonase, Anoro.  He uses albuterol approximately once a day,  usually at bedtime.  Clears a lot of mucous every morning, tan color. He hears wheeze daily. Has exertional SOB when walking to the mailbox. Has nasal obstruction ad mucous. He still has central chest pain following CPR. Does not have O2 at home. He is due to have a repeat sleep study done soon - apparently home test was inconclusive.  No abx or prednisone.     Review of Systems As per HPI  Past Medical History:  Diagnosis Date   Acute diastolic heart failure (HCC)    Arthritis    Cardiac arrest (HCC)    CHF (congestive heart failure) (HCC)    Headache    Hypertension    Hypertensive urgency      Family History  Problem Relation Age of Onset   Ovarian cancer Mother 51       primary peritoneal cancer   Cancer Mother    Diabetes Mother    Heart disease Mother    Hyperlipidemia Mother    Hypertension Mother    Stroke Mother    Mental illness Mother    Colon polyps Father        61 on last colonoscopy   Heart disease Father    Hyperlipidemia Father    Hypertension Father    Stroke Father    Cancer Sister    Colon cancer Sister 41       cancer of the sigmoid colon   Heart disease Brother    Hypertension Brother    Heart disease Brother    Hyperlipidemia Brother    Hypertension  Brother    Down syndrome Paternal Aunt    Cancer Paternal Uncle        cancer of the spine   COPD Maternal Grandmother    Stomach cancer Paternal Grandmother      Social History   Socioeconomic History   Marital status: Married    Spouse name: Bonita Quin   Number of children: 1   Years of education: Not on file   Highest education level: High school graduate  Occupational History   Occupation: Retired  Tobacco Use   Smoking status: Every Day    Average packs/day: 3.0 packs/day for 38.0 years (114.0 ttl pk-yrs)    Types: Cigarettes    Start date: 1985    Last attempt to quit: 2023    Years since quitting: 2.1   Smokeless tobacco: Never  Vaping Use   Vaping status: Never Used   Substance and Sexual Activity   Alcohol use: Yes    Comment: drinks about 3 days a week   Drug use: Not Currently    Types: Marijuana    Comment: 1 x per week   Sexual activity: Not on file  Other Topics Concern   Not on file  Social History Narrative   ** Merged History Encounter **       Social Drivers of Health   Financial Resource Strain: Not on file  Food Insecurity: Food Insecurity Present (02/06/2023)   Hunger Vital Sign    Worried About Running Out of Food in the Last Year: Sometimes true    Ran Out of Food in the Last Year: Sometimes true  Transportation Needs: No Transportation Needs (02/06/2023)   PRAPARE - Administrator, Civil Service (Medical): No    Lack of Transportation (Non-Medical): No  Physical Activity: Not on file  Stress: Not on file  Social Connections: Not on file  Intimate Partner Violence: Not At Risk (02/06/2023)   Humiliation, Afraid, Rape, and Kick questionnaire    Fear of Current or Ex-Partner: No    Emotionally Abused: No    Physically Abused: No    Sexually Abused: No     No Known Allergies   Outpatient Medications Prior to Visit  Medication Sig Dispense Refill   albuterol (PROVENTIL) (2.5 MG/3ML) 0.083% nebulizer solution Take 3 mLs (2.5 mg total) by nebulization every 6 (six) hours as needed for wheezing or shortness of breath. 75 mL 12   albuterol (VENTOLIN HFA) 108 (90 Base) MCG/ACT inhaler Inhale 1-2 puffs into the lungs every 4 (four) hours as needed for wheezing or shortness of breath. 18 g 0   amiodarone (PACERONE) 200 MG tablet Take 2 tablets (400 mg total) by mouth 2 (two) times daily for 14 days, THEN 1 tablet (200 mg total) 2 (two) times daily. (Patient taking differently: No sig reported) 60 tablet 1   apixaban (ELIQUIS) 5 MG TABS tablet Take 1 tablet (5 mg total) by mouth 2 (two) times daily. 180 tablet 1   aspirin EC 81 MG tablet Take 1 tablet (81 mg total) by mouth daily. Swallow whole. 90 tablet 1    atorvastatin (LIPITOR) 80 MG tablet Take 1 tablet (80 mg total) by mouth daily. 90 tablet 3   dapagliflozin propanediol (FARXIGA) 10 MG TABS tablet Take 1 tablet (10 mg total) by mouth daily. Please keep upcoming appointment in January 2025 for future refills. Thank you 30 tablet 0   fluticasone (FLONASE) 50 MCG/ACT nasal spray Place 2 sprays into both nostrils daily. 16 g  0   furosemide (LASIX) 40 MG tablet Take 1 tablet (40 mg total) by mouth daily. 90 tablet 3   ranolazine (RANEXA) 500 MG 12 hr tablet Take 500 mg by mouth 2 (two) times daily.     sacubitril-valsartan (ENTRESTO) 97-103 MG Take 1 tablet by mouth 2 (two) times daily. 180 tablet 3   spironolactone (ALDACTONE) 25 MG tablet Take 0.5 tablets (12.5 mg total) by mouth daily. 45 tablet 3   umeclidinium-vilanterol (ANORO ELLIPTA) 62.5-25 MCG/ACT AEPB Inhale 1 puff into the lungs daily. 60 each 1   metoprolol succinate (TOPROL-XL) 50 MG 24 hr tablet Take 1 tablet (50 mg total) by mouth daily. Take with or immediately following a meal. 30 tablet 1   No facility-administered medications prior to visit.        Objective:   Physical Exam  Vitals:   05/19/23 0923  BP: (!) 150/85  Pulse: 69  SpO2: 96%  Weight: 264 lb (119.7 kg)  Height: 6\' 2"  (1.88 m)    Gen: Pleasant, well-nourished, in no distress,  normal affect  ENT: No lesions,  mouth clear,  oropharynx clear, nasal obstruction without any active drainage  Neck: No JVD, no stridor  Lungs: No use of accessory muscles, distant, some scattered rhonchi.  No overt wheezing  Cardiovascular: RRR, heart sounds normal, no murmur or gallops, no peripheral edema  Musculoskeletal: No deformities, no cyanosis or clubbing  Neuro: alert, awake, non focal  Skin: Warm, no lesions or rash     Assessment & Plan:  Mediastinal adenopathy Identified in July 2024 when he was admitted with volume overload.  Resolved on subsequent myocardial PET scan.  No indication for further imaging or  biopsy.  Reassured him about this.  COPD (chronic obstructive pulmonary disease) (HCC) Temporarily stop your Anoro.  We will try starting Trelegy 1 inhalation once daily to see if you tolerate and whether this helps with your breathing and your mucus production.  We will give you samples.  If you benefit then please call our office so we can send a prescription for Trelegy to your pharmacy. Keep your albuterol available to use either 2 puffs or 1 nebulizer treatment up to every 4 hours if needed for shortness of breath, chest tightness, wheezing. Follow with Dr Delton Coombes in 6 months or sooner if you have any problems  Tobacco user He has significantly decreased his cigarettes and is motivated to decrease more.  We will continue to support him in this.  After he cuts down further we may be able to consider setting a quit date.  Allergic rhinitis Continue your fluticasone nasal spray, 2 sprays each nostril once daily    Levy Pupa, MD, PhD 05/19/2023, 9:43 AM [facility] [phone] or if no answer before 7:00PM call [phone] For any issues after 7:00PM please call eLink [phone]

## 2023-05-22 ENCOUNTER — Ambulatory Visit: Payer: Medicaid Other | Attending: Cardiology | Admitting: Cardiology

## 2023-05-22 ENCOUNTER — Encounter: Payer: Self-pay | Admitting: Cardiology

## 2023-05-22 ENCOUNTER — Other Ambulatory Visit (HOSPITAL_COMMUNITY): Payer: Self-pay

## 2023-05-22 VITALS — BP 210/100 | HR 87 | Ht 74.0 in | Wt 262.8 lb

## 2023-05-22 DIAGNOSIS — D6869 Other thrombophilia: Secondary | ICD-10-CM

## 2023-05-22 DIAGNOSIS — I2510 Atherosclerotic heart disease of native coronary artery without angina pectoris: Secondary | ICD-10-CM

## 2023-05-22 DIAGNOSIS — I4901 Ventricular fibrillation: Secondary | ICD-10-CM

## 2023-05-22 DIAGNOSIS — I4819 Other persistent atrial fibrillation: Secondary | ICD-10-CM | POA: Diagnosis not present

## 2023-05-22 DIAGNOSIS — I428 Other cardiomyopathies: Secondary | ICD-10-CM

## 2023-05-22 DIAGNOSIS — Z79899 Other long term (current) drug therapy: Secondary | ICD-10-CM

## 2023-05-22 DIAGNOSIS — I10 Essential (primary) hypertension: Secondary | ICD-10-CM

## 2023-05-22 LAB — CUP PACEART INCLINIC DEVICE CHECK
Date Time Interrogation Session: 20250224153442
HighPow Impedance: 77 Ohm
Implantable Lead Connection Status: 753985
Implantable Lead Implant Date: 20241111
Implantable Lead Location: 753860
Implantable Lead Model: 138
Implantable Lead Serial Number: 305868
Implantable Pulse Generator Implant Date: 20241111
Lead Channel Impedance Value: 567 Ohm
Lead Channel Pacing Threshold Amplitude: 0.5 V
Lead Channel Pacing Threshold Amplitude: 0.7 V
Lead Channel Pacing Threshold Pulse Width: 0.4 ms
Lead Channel Pacing Threshold Pulse Width: 0.4 ms
Lead Channel Sensing Intrinsic Amplitude: 21.9 mV
Lead Channel Setting Pacing Amplitude: 2 V
Lead Channel Setting Pacing Pulse Width: 0.4 ms
Lead Channel Setting Sensing Sensitivity: 0.6 mV
Pulse Gen Serial Number: 221194
Zone Setting Status: 755011

## 2023-05-22 MED ORDER — METOPROLOL SUCCINATE ER 100 MG PO TB24
100.0000 mg | ORAL_TABLET | Freq: Every day | ORAL | 3 refills | Status: DC
  Filled 2023-05-22: qty 90, 90d supply, fill #0

## 2023-05-22 NOTE — Patient Instructions (Addendum)
 Medication Instructions:  Your physician has recommended you make the following change in your medication:  INCREASE Metoprolol Succinate (Toprol) to 100 mg once a day  *If you need a refill on your cardiac medications before your next appointment, please call your pharmacy*   Lab Work: None ordered   Testing/Procedures: None ordered   Follow-Up: At [facility], you and your health needs are our priority.  As part of our continuing mission to provide you with exceptional heart care, we have created designated Provider Care Teams.  These Care Teams include your primary Cardiologist (physician) and Advanced Practice Providers (APPs -  Physician Assistants and Nurse Practitioners) who all work together to provide you with the care you need, when you need it.  Your next appointment:   1 year(s)  The format for your next appointment:   In Person  Provider:   Loman Brooklyn, MD    Thank you for choosing [facility]!!   Dory Horn, RN [phone]        3

## 2023-05-22 NOTE — Progress Notes (Signed)
 Electrophysiology Office Note:   Date:  05/22/2023  ID:  Michael Bell, DOB 11/06/1960, MRN [id number]  Primary Cardiologist: Michael Lerner, DO Primary Heart Failure: None Electrophysiologist: Michael Villa Jorja Loa, MD      History of Present Illness:   Michael Bell is a 63 y.o. 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   02/04/2023, he called EMS due to chest pain and diaphoresis.  Initial rhythm was atrial fibrillation, but he went into ventricular fibrillation.  He received CPR and 3 shocks.  He is now post ICD for secondary prevention.  On 02/08/2023 he had further episodes of VT/VF requiring ATP therapy.  His amiodarone was increased at that time.  Since last being seen in our clinic the patient reports significant fatigue and shortness of breath.  He has trouble walking to the mailbox.  He has been feeling this way since his cardiac arrest.  He has an upcoming sleep study.  He also states that his blood pressures at home are in the 150s.  He can tell when his blood pressure is elevated as he gets chest discomfort.  Today, other than fatigue, he is without complaint.  he denies chest pain, palpitations, dyspnea, PND, orthopnea, nausea, vomiting, dizziness, syncope, edema, weight gain, or early satiety.   Review of systems complete and found to be negative unless listed in HPI.      EP Information / Studies Reviewed:    EKG is ordered today. Personal review as below.  EKG Interpretation Date/Time:  Monday May 22 2023 14:58:11 EST Ventricular Rate:  87 PR Interval:  204 QRS Duration:  118 QT Interval:  418 QTC Calculation: 502 R Axis:   -40  Text Interpretation: Normal sinus rhythm Left axis deviation Incomplete left bundle branch block Prolonged QT Abnormal ECG When compared with ECG of 14-Apr-2023 10:13, No  significant change was found Confirmed by Michael Bell ([id number]) on 05/22/2023 3:08:57 PM   ICD Interrogation-  reviewed in detail today,  See PACEART report.  Device History: [employer] features editor ICD implanted 02/06/23 for cardiac arrest History of appropriate therapy: Yes History of AAD therapy: Yes; currently on amiodarone    Risk Assessment/Calculations:    CHA2DS2-VASc Score = 3   This indicates a 3.2% annual risk of stroke. The patient's score is based upon: CHF History: 1 HTN History: 1 Diabetes History: 0 Stroke History: 0 Vascular Disease History: 1 Age Score: 0 Gender Score: 0            Physical Exam:   VS:  BP (!) 210/100   Pulse 87   Ht 6\' 2"  (1.88 m)   Wt 262 lb 12.8 oz (119.2 kg)   SpO2 94%   BMI 33.74 kg/m    Wt Readings from Last 3 Encounters:  05/22/23 262 lb 12.8 oz (119.2 kg)  05/19/23 264 lb (119.7 kg)  04/14/23 262 lb 9.6 oz (119.1 kg)     GEN: Well nourished, well developed in no acute distress NECK: No JVD; No carotid bruits CARDIAC: Regular rate and rhythm, no murmurs, rubs, gallops RESPIRATORY:  Clear to auscultation without rales, wheezing or rhonchi  ABDOMEN: Soft, non-tender, non-distended EXTREMITIES:  No edema; No deformity   ASSESSMENT AND PLAN:    Chronic systolic dysfunction with VT/VF arrest s/p [employer] single chamber ICD  euvolemic today Stable on an appropriate medical regimen Normal ICD function See Michael Bell  Art report Sensing, Threshold, impedance within normal limits No changes today Currently on amiodarone.  2.  Paroxysmal atrial fibrillation: Currently on amiodarone.  Remains in sinus rhythm.  3.  Secondary hypercoagulable state: Currently on Eliquis 5 mg twice daily for atrial fibrillation  4.  High risk medication monitoring: Currently on amiodarone 200 mg daily.  Michael Bell check TSH and LFTs today.  5.  Coronary artery disease: No current chest pain.  Plan per primary cardiology.  6.   Hypertension: Blood pressure significantly elevated.  He is on max doses of multiple medications.  Michael Bell increase Toprol-XL to 100 mg.  I would further discuss his case with his primary cardiologist.  In addition, the patient does have some paperwork that needs to be signed for possible disability.  I Michael Bell allow his primary cardiologist and primary physician to take care of these issues.  Disposition:   Follow up with Michael Bell in 12 months   Signed, Michael Naramore Jorja Loa, MD

## 2023-05-25 ENCOUNTER — Other Ambulatory Visit: Payer: Self-pay

## 2023-05-25 ENCOUNTER — Other Ambulatory Visit (HOSPITAL_COMMUNITY): Payer: Self-pay

## 2023-05-25 ENCOUNTER — Other Ambulatory Visit: Payer: Self-pay | Admitting: Student

## 2023-05-25 MED ORDER — FLUTICASONE PROPIONATE 50 MCG/ACT NA SUSP
2.0000 | Freq: Every day | NASAL | 0 refills | Status: DC
  Filled 2023-05-25: qty 16, 30d supply, fill #0

## 2023-05-25 NOTE — Telephone Encounter (Signed)
 Medication sent to pharmacy

## 2023-05-26 ENCOUNTER — Other Ambulatory Visit (HOSPITAL_COMMUNITY): Payer: Self-pay

## 2023-05-26 DIAGNOSIS — J438 Other emphysema: Secondary | ICD-10-CM | POA: Diagnosis not present

## 2023-05-30 ENCOUNTER — Ambulatory Visit (HOSPITAL_COMMUNITY): Payer: Medicaid Other

## 2023-05-30 ENCOUNTER — Telehealth: Payer: Self-pay

## 2023-05-30 ENCOUNTER — Encounter (HOSPITAL_COMMUNITY): Payer: Medicaid Other | Admitting: Internal Medicine

## 2023-05-30 ENCOUNTER — Telehealth: Payer: Self-pay | Admitting: Cardiology

## 2023-05-30 NOTE — Telephone Encounter (Signed)
-----   Message from [facility] sent at 05/28/2023 12:15 AM EST ----- Call him and see how his BP readings are at home.  Ask if he taking any PD5 inhibitors for ED or BPH.  Has he stopped or ran out off any BP meds  Tessa Lerner, DO, [facility] ----- Message ----- From: Regan Lemming, MD Sent: 05/22/2023   3:38 PM EST To: Tessa Lerner, DO  I saw Michael Bell today.  He is feeling quite bad since his arrest.  He has significant fatigue and shortness of breath.  He does not appear overly volume overloaded to me.  I think part of the issue could be his blood pressure.  He is significantly hypertensive in clinic today.  He is on maximal doses of multiple medications, though I am going to increase his metoprolol.  I think it would be beneficial for him to be seen soon by general cardiology, or be seen in our pharmacy hypertension clinic.  Additionally, as he is on multiple medications, he may need to see Chilton Si in her hypertension clinic.  He is asking for me to fill out some disability paperwork, but as this is the first time I have seen him I have told him that it is better for you or his primary physician to fill this out.  His device is functioning appropriately.  Let me know if there is anything more I can do with you for his care. He will need LFTs and a TSH in 6 months.  I will see him back in 1 year.

## 2023-05-30 NOTE — Telephone Encounter (Signed)
 Pt is requesting a callback regarding his lawyer needing paperwork filled out or something being written up about his condition or health being that he can't do anything anymore. He'd like to discuss further once called back. Please advise

## 2023-05-30 NOTE — Telephone Encounter (Signed)
 Spoke with pt and he stated that he has been taking his blood pressure at home but hasn't been keeping a log of them. States that they do still run high at times. Pt advised to keep a log of his blood pressures in the morning when he wakes up and then 1-2 hours after taking morning medications for the next week and then to let us know what they log shows.   Pt denies being on any PD5 inhibitors. Pt states he has not stopped and is not out of any BP medications.

## 2023-05-30 NOTE — Telephone Encounter (Signed)
 Spoke with pt and asked for clarification of what information was needed. Pt stated his lawyer needed information regarding his heart attack and other health concerns going on. Pt advised to discuss this with PCP in order to get the information completed but pt stated his PCP stated this needs to be completed by our office. Information is being forwarded to Dr. Odis Hollingshead.

## 2023-05-31 NOTE — Telephone Encounter (Signed)
 He has access to MyChart which includes office notes, hospital records, test results that he can provide to his legal counsel as requested.   Regards,   Summers Buendia Gladstone, DO, [facility]

## 2023-06-06 ENCOUNTER — Encounter: Payer: Medicaid Other | Admitting: Internal Medicine

## 2023-06-16 ENCOUNTER — Other Ambulatory Visit (HOSPITAL_COMMUNITY): Payer: Self-pay

## 2023-06-16 ENCOUNTER — Other Ambulatory Visit: Payer: Self-pay

## 2023-06-16 ENCOUNTER — Other Ambulatory Visit: Payer: Self-pay | Admitting: Cardiology

## 2023-06-19 ENCOUNTER — Other Ambulatory Visit (HOSPITAL_COMMUNITY): Payer: Self-pay

## 2023-06-19 ENCOUNTER — Ambulatory Visit: Payer: Medicaid Other

## 2023-06-19 DIAGNOSIS — I428 Other cardiomyopathies: Secondary | ICD-10-CM

## 2023-06-19 DIAGNOSIS — I4819 Other persistent atrial fibrillation: Secondary | ICD-10-CM | POA: Diagnosis not present

## 2023-06-19 MED ORDER — DAPAGLIFLOZIN PROPANEDIOL 10 MG PO TABS
10.0000 mg | ORAL_TABLET | Freq: Every day | ORAL | 3 refills | Status: DC
  Filled 2023-06-19: qty 90, 90d supply, fill #0

## 2023-06-20 LAB — CUP PACEART REMOTE DEVICE CHECK
Battery Remaining Longevity: 180 mo
Battery Remaining Percentage: 100 %
Brady Statistic RV Percent Paced: 0 %
Date Time Interrogation Session: 20250324043200
HighPow Impedance: 73 Ohm
Implantable Lead Connection Status: 753985
Implantable Lead Implant Date: 20241111
Implantable Lead Location: 753860
Implantable Lead Model: 138
Implantable Lead Serial Number: 305868
Implantable Pulse Generator Implant Date: 20241111
Lead Channel Impedance Value: 572 Ohm
Lead Channel Pacing Threshold Amplitude: 0.6 V
Lead Channel Pacing Threshold Pulse Width: 0.4 ms
Lead Channel Setting Pacing Amplitude: 2 V
Lead Channel Setting Pacing Pulse Width: 0.4 ms
Lead Channel Setting Sensing Sensitivity: 0.6 mV
Pulse Gen Serial Number: 221194
Zone Setting Status: 755011

## 2023-06-25 DIAGNOSIS — J438 Other emphysema: Secondary | ICD-10-CM | POA: Diagnosis not present

## 2023-06-28 ENCOUNTER — Encounter (HOSPITAL_COMMUNITY)

## 2023-06-28 ENCOUNTER — Ambulatory Visit (HOSPITAL_COMMUNITY)

## 2023-07-04 ENCOUNTER — Other Ambulatory Visit: Payer: Self-pay

## 2023-07-04 ENCOUNTER — Other Ambulatory Visit: Payer: Self-pay | Admitting: Student

## 2023-07-05 MED ORDER — FLUTICASONE PROPIONATE 50 MCG/ACT NA SUSP
2.0000 | Freq: Every day | NASAL | 0 refills | Status: DC
  Filled 2023-07-05: qty 16, 30d supply, fill #0

## 2023-07-06 ENCOUNTER — Other Ambulatory Visit (HOSPITAL_COMMUNITY): Payer: Self-pay

## 2023-07-25 ENCOUNTER — Telehealth (HOSPITAL_COMMUNITY): Payer: Self-pay | Admitting: *Deleted

## 2023-07-25 NOTE — Telephone Encounter (Signed)
 Called patient and wife; was unable to confirm/remind patient of their appointment 07/26/23 due to both phone numbers being "unavailable at this time.   Appointment:              [] Confirmed             [] Left mess              [] No answer/No voice mail             [x] Phone not in service   Patient reminded to bring all medications and/or complete list.   Confirmed patient has transportation. Gave directions, instructed to utilize valet parking.

## 2023-07-26 ENCOUNTER — Ambulatory Visit (HOSPITAL_COMMUNITY)
Admission: RE | Admit: 2023-07-26 | Discharge: 2023-07-26 | Disposition: A | Discharge status: Home / Self Care | Source: Ambulatory Visit | Attending: Family Medicine | Admitting: Family Medicine

## 2023-07-26 ENCOUNTER — Encounter (HOSPITAL_COMMUNITY): Payer: Self-pay

## 2023-07-26 ENCOUNTER — Ambulatory Visit (HOSPITAL_COMMUNITY)
Admission: RE | Admit: 2023-07-26 | Discharge: 2023-07-26 | Disposition: A | Discharge status: Home / Self Care | Source: Ambulatory Visit | Attending: Family Medicine

## 2023-07-26 VITALS — BP 104/78 | HR 63 | Wt 255.6 lb

## 2023-07-26 DIAGNOSIS — Z91148 Patient's other noncompliance with medication regimen for other reason: Secondary | ICD-10-CM | POA: Insufficient documentation

## 2023-07-26 DIAGNOSIS — Z8674 Personal history of sudden cardiac arrest: Secondary | ICD-10-CM | POA: Diagnosis not present

## 2023-07-26 DIAGNOSIS — J449 Chronic obstructive pulmonary disease, unspecified: Secondary | ICD-10-CM | POA: Diagnosis not present

## 2023-07-26 DIAGNOSIS — Z95810 Presence of automatic (implantable) cardiac defibrillator: Secondary | ICD-10-CM | POA: Insufficient documentation

## 2023-07-26 DIAGNOSIS — Z7984 Long term (current) use of oral hypoglycemic drugs: Secondary | ICD-10-CM | POA: Insufficient documentation

## 2023-07-26 DIAGNOSIS — I4729 Other ventricular tachycardia: Secondary | ICD-10-CM | POA: Insufficient documentation

## 2023-07-26 DIAGNOSIS — J438 Other emphysema: Secondary | ICD-10-CM | POA: Diagnosis not present

## 2023-07-26 DIAGNOSIS — I110 Hypertensive heart disease with heart failure: Secondary | ICD-10-CM | POA: Diagnosis not present

## 2023-07-26 DIAGNOSIS — R0683 Snoring: Secondary | ICD-10-CM | POA: Insufficient documentation

## 2023-07-26 DIAGNOSIS — Z7982 Long term (current) use of aspirin: Secondary | ICD-10-CM | POA: Diagnosis not present

## 2023-07-26 DIAGNOSIS — Z79899 Other long term (current) drug therapy: Secondary | ICD-10-CM | POA: Insufficient documentation

## 2023-07-26 DIAGNOSIS — I10 Essential (primary) hypertension: Secondary | ICD-10-CM

## 2023-07-26 DIAGNOSIS — I4901 Ventricular fibrillation: Secondary | ICD-10-CM | POA: Insufficient documentation

## 2023-07-26 DIAGNOSIS — I2510 Atherosclerotic heart disease of native coronary artery without angina pectoris: Secondary | ICD-10-CM | POA: Diagnosis not present

## 2023-07-26 DIAGNOSIS — Z7901 Long term (current) use of anticoagulants: Secondary | ICD-10-CM | POA: Diagnosis not present

## 2023-07-26 DIAGNOSIS — I480 Paroxysmal atrial fibrillation: Secondary | ICD-10-CM | POA: Insufficient documentation

## 2023-07-26 DIAGNOSIS — R591 Generalized enlarged lymph nodes: Secondary | ICD-10-CM

## 2023-07-26 DIAGNOSIS — Z87891 Personal history of nicotine dependence: Secondary | ICD-10-CM | POA: Insufficient documentation

## 2023-07-26 DIAGNOSIS — R0789 Other chest pain: Secondary | ICD-10-CM | POA: Diagnosis not present

## 2023-07-26 DIAGNOSIS — I5022 Chronic systolic (congestive) heart failure: Secondary | ICD-10-CM | POA: Insufficient documentation

## 2023-07-26 DIAGNOSIS — I428 Other cardiomyopathies: Secondary | ICD-10-CM | POA: Insufficient documentation

## 2023-07-26 DIAGNOSIS — I25119 Atherosclerotic heart disease of native coronary artery with unspecified angina pectoris: Secondary | ICD-10-CM | POA: Diagnosis not present

## 2023-07-26 DIAGNOSIS — F17210 Nicotine dependence, cigarettes, uncomplicated: Secondary | ICD-10-CM

## 2023-07-26 LAB — CBC
HCT: 46.4 % (ref 39.0–52.0)
Hemoglobin: 15.2 g/dL (ref 13.0–17.0)
MCH: 28.6 pg (ref 26.0–34.0)
MCHC: 32.8 g/dL (ref 30.0–36.0)
MCV: 87.2 fL (ref 80.0–100.0)
Platelets: 251 10*3/uL (ref 150–400)
RBC: 5.32 MIL/uL (ref 4.22–5.81)
RDW: 13.5 % (ref 11.5–15.5)
WBC: 10.1 10*3/uL (ref 4.0–10.5)
nRBC: 0 % (ref 0.0–0.2)

## 2023-07-26 LAB — BASIC METABOLIC PANEL WITH GFR
Anion gap: 11 (ref 5–15)
BUN: 15 mg/dL (ref 8–23)
CO2: 24 mmol/L (ref 22–32)
Calcium: 8.9 mg/dL (ref 8.9–10.3)
Chloride: 102 mmol/L (ref 98–111)
Creatinine, Ser: 1.41 mg/dL — ABNORMAL HIGH (ref 0.61–1.24)
GFR, Estimated: 56 mL/min — ABNORMAL LOW (ref >60)
Glucose, Bld: 97 mg/dL (ref 70–99)
Potassium: 4.2 mmol/L (ref 3.5–5.1)
Sodium: 137 mmol/L (ref 135–145)

## 2023-07-26 LAB — ECHOCARDIOGRAM COMPLETE
Area-P 1/2: 2.05 cm2
Calc EF: 50.6 %
S' Lateral: 3.5 cm
Single Plane A2C EF: 52.6 %
Single Plane A4C EF: 50 %

## 2023-07-26 LAB — BRAIN NATRIURETIC PEPTIDE: B Natriuretic Peptide: 47.9 pg/mL (ref 0.0–100.0)

## 2023-07-26 NOTE — Progress Notes (Signed)
 [facility]   Primary Care: Adria Hopkins, MD Primary Cardiologist: Dr. Albert Huff HF Cardiologist: Dr. Julane Ny  CC: HF follow up  HPI: 63 y.o. male with history of CAD, COPD, tobacco and ETOH abuse, PAF, HFrEF, medication noncompliance, HTN. Has been followed by Dr. Albert Huff in the past.   Had NSTEMI 05/23. Echo 05/23: EF 40-45%. R/LHC 05/23: Fick CI 3.37, 90% RCA, distal Cx occluded and ulcerated. Treated medically.  Admitted with a/c HFrEF, angina and Afib with RVR 7/24. Echo with EF 35-40%, severe BAE. R/LHC 7/24: 80-90% m RCA, known occlusion Lcx, CI 2.0.  CTA chest 07/24: Mediastinal and bilateral hilar lymph nodes. He saw Dr. Baldwin Levee in 08/24. PET CT ordered, but denied by insurance.  EMS called 02/04/23 d/t chest pain and diaphoresis. Initial rhythm in the field was Afib followed by reported Vfib arrest requiring CPR and 3 shocks. Available strips appeared polymorphic VT on review by EP. Cardiac cath with 50% m LCX, very distal occlusion LCx and 80-90% RCA (no clear culprit and unchanged from 07/24 angiogram). Echo with EF down to < 20% (previously 35-40%). 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   Had multiple episodes of VT/VF and received 7 ATPs on 02/08/23. His amiodarone  was increased to 400 mg BID X 2 weeks.   Seen in TOC 02/21/23, NYHA III and volume stable. GDMT titrated, sleep study and Cardiac PET arranged.  Cardiac PET (12/24) with no evidence of active myocardial inflammation/sarcoidosis, EF 37%, no RWMAs.  Went to ED 03/02/23 with CP. Initial HsTroponin ok, left before further work up completed.   Today he returns for HF follow up. Overall feeling tired. Remains SOB walking short distances on flat ground and with ADLs. Legs swell and he feels bloated today. Continues with atypical chest pain, resolves spontaneously. He has dizziness if he rushes, no falls. Denies palpitations, abnormal bleeding or  PND/Orthopnea. Appetite ok. No fever or chills. Weight at home 255 pounds. Taking all medications. Smoking 1ppd, previously worked in [employer] x years. BP at home up and down.  Echo today 07/26/23, EF appears improved on my read ~ 50%, official MD interpretation pending.   Past Medical History:  Diagnosis Date   Acute diastolic heart failure (HCC)    Arthritis    Cardiac arrest (HCC)    CHF (congestive heart failure) (HCC)    Headache    Hypertension    Hypertensive urgency    Current Outpatient Medications  Medication Sig Dispense Refill   albuterol  (PROVENTIL ) (2.5 MG/3ML) 0.083% nebulizer solution Take 3 mLs (2.5 mg total) by nebulization every 6 (six) hours as needed for wheezing or shortness of breath. 75 mL 12   albuterol  (VENTOLIN  HFA) 108 (90 Base) MCG/ACT inhaler Inhale 1-2 puffs into the lungs every 4 (four) hours as needed for wheezing or shortness of breath. 18 g 0   amiodarone  (PACERONE ) 200 MG tablet Take 200 mg by mouth 2 (two) times daily.     apixaban  (ELIQUIS ) 5 MG TABS tablet Take 1 tablet (5 mg total) by mouth 2 (two) times daily. 180 tablet 1   aspirin  EC 81 MG tablet Take 1 tablet (81 mg total) by mouth daily. Swallow whole. 90 tablet 1   atorvastatin  (LIPITOR ) 80 MG tablet Take 1 tablet (80 mg total) by mouth daily. 90 tablet 3   dapagliflozin  propanediol (FARXIGA ) 10 MG TABS tablet Take 1 tablet (10 mg total) by mouth daily. 90 tablet 3   fluticasone  (  FLONASE ) 50 MCG/ACT nasal spray Place 2 sprays into both nostrils daily. 16 g 0   Fluticasone -Umeclidin-Vilant (TRELEGY ELLIPTA ) 100-62.5-25 MCG/ACT AEPB Inhale 1 puff into the lungs daily.     furosemide  (LASIX ) 40 MG tablet Take 1 tablet (40 mg total) by mouth daily. 90 tablet 3   isosorbide -hydrALAZINE  (BIDIL ) 20-37.5 MG tablet Take 1 tablet by mouth 3 (three) times daily.     METOPROLOL  SUCCINATE PO 50 mg daily. Patient takes 0.5 tablet by mouth daily.     ranolazine  (RANEXA ) 500 MG 12 hr tablet Take  500 mg by mouth 2 (two) times daily.     sacubitril -valsartan  (ENTRESTO ) 97-103 MG Take 1 tablet by mouth 2 (two) times daily. 180 tablet 3   spironolactone  (ALDACTONE ) 25 MG tablet Take 0.5 tablets (12.5 mg total) by mouth daily. 45 tablet 3   umeclidinium-vilanterol (ANORO ELLIPTA ) 62.5-25 MCG/ACT AEPB Inhale 1 puff into the lungs daily. 60 each 1   No current facility-administered medications for this encounter.   No Known Allergies  Social History   Socioeconomic History   Marital status: Married    Spouse name: Stana Ear   Number of children: 1   Years of education: Not on file   Highest education level: High school graduate  Occupational History   Occupation: Retired  Tobacco Use   Smoking status: Every Day    Average packs/day: 3.0 packs/day for 38.0 years (114.0 ttl pk-yrs)    Types: Cigarettes    Start date: 1985    Last attempt to quit: 2023    Years since quitting: 2.3   Smokeless tobacco: Never  Vaping Use   Vaping status: Never Used  Substance and Sexual Activity   Alcohol use: Yes    Comment: drinks about 3 days a week   Drug use: Not Currently    Types: Marijuana    Comment: 1 x per week   Sexual activity: Not on file  Other Topics Concern   Not on file  Social History Narrative   ** Merged History Encounter **       Social Drivers of Health   Financial Resource Strain: Not on file  Food Insecurity: Food Insecurity Present (02/06/2023)   Hunger Vital Sign    Worried About Running Out of Food in the Last Year: Sometimes true    Ran Out of Food in the Last Year: Sometimes true  Transportation Needs: No Transportation Needs (02/06/2023)   PRAPARE - Administrator, Civil Service (Medical): No    Lack of Transportation (Non-Medical): No  Physical Activity: Not on file  Stress: Not on file  Social Connections: Not on file  Intimate Partner Violence: Not At Risk (02/06/2023)   Humiliation, Afraid, Rape, and Kick questionnaire    Fear of Current  or Ex-Partner: No    Emotionally Abused: No    Physically Abused: No    Sexually Abused: No   Family History  Problem Relation Age of Onset   Ovarian cancer Mother 62       primary peritoneal cancer   Cancer Mother    Diabetes Mother    Heart disease Mother    Hyperlipidemia Mother    Hypertension Mother    Stroke Mother    Mental illness Mother    Colon polyps Father        38 on last colonoscopy   Heart disease Father    Hyperlipidemia Father    Hypertension Father    Stroke Father    Cancer  Sister    Colon cancer Sister 12       cancer of the sigmoid colon   Heart disease Brother    Hypertension Brother    Heart disease Brother    Hyperlipidemia Brother    Hypertension Brother    Down syndrome Paternal Aunt    Cancer Paternal Uncle        cancer of the spine   COPD Maternal Grandmother    Stomach cancer Paternal Grandmother    BP 104/78   Pulse 63   Wt 115.9 kg (255 lb 9.6 oz)   SpO2 96%   BMI 32.82 kg/m   Wt Readings from Last 3 Encounters:  07/26/23 115.9 kg (255 lb 9.6 oz)  05/22/23 119.2 kg (262 lb 12.8 oz)  05/19/23 119.7 kg (264 lb)   PHYSICAL EXAM: General:  NAD. No resp difficulty, walked into clinic HEENT: Normal Neck: Supple. No JVD. Cor: Regular rate & rhythm. No rubs, gallops or murmurs. + TTP sternum Lungs: Diminished throughout Abdomen: Soft, nontender, nondistended.  Extremities: No cyanosis, clubbing, rash, edema Neuro: Alert & oriented x 3, moves all 4 extremities w/o difficulty. Affect pleasant.  Device interrogation (personally reviewed):  stable thoracic impedence, no VT, average HR 70 bpm, 3.6 hr/day activity  ASSESSMENT & PLAN: HFrEF/NICM - Echo 07/24: EF 35-40, RV okay - Polymorphic VT/VF arrest 11/24 s/p CPR and 3 shocks - Echo 11/24: EF < 20%, RV severely reduced, severe BAE - LHC 11/24: 50% m Cx, occluded distal LCx (unchanged from prior), 80-90% small, diffuse m RCA.  - Etiology not certain. Cardiomyopathy out of  proportion to CAD. He does have uncontrolled HTN. Note mediastinal and hilar lymphadenopathy on CTA chest in 7/24. 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  - Cardiac PET (12/24) showed no evidence of active inflammation or sarcoidosis, EF 37% - May eventually need cMRI. - Echo today 07/25/23 EF appears improved, ~ 50%. Await for official MD interpretation - NYHA III, suspect COPD contributing to dyspnea. volume stable - Continue Entresto  97/103 mg bid. - Continue Lasix  40 mg daily. - Continue Farxiga  10 mg daily. - Continue BiDil  1 tab tid. - Continue spiro 12.5 mg daily. - Continue Toprol  XL 25 mg daily (consider switch to more beta-1 selective down the road). - Labs today  2. CAD - LHC as above (RCA is very small co-dominant vessel with diffuse non-critical disease. Not candidate for revascularization)  - Rare atypical CP, suspect this is MSK from CPR - Continue ASA + statin. - Continue beta blocker and Ranexa .  3. VT/VF - Polymorphic VT/VF arrest 11/24 s/p CPR and 3 shocks - S/p single chamber [place] Sci ICD 02/06/23 - Received 7 ATP for VT/VF 02/08/23 - Continue amiodarone , per EP - Device interrogation as above  4. PAF - No atrial lead on device. - Regular on exam today - Continue Eliquis  5 mg bid. - No bleeding issues  5. HTN - BP better controlled - Continue current meds as above - Labs today.  6. Hx of AKI - Scr up to 1.9 during recent admit following cardiac arrest, down to 1.6 at discharge. - Baseline 1.3. - Labs today  7. Snoring - Home sleep study inconclusive - He has been referred for PSG.  8. Lymphadenopathy - Seen on CT 7/24. - No acute metabolic findings on recent PET - Resolved on cardiac PET - Followed by Dr. Baldwin Levee. Has significant tobacco hx.  9. Tobacco use - Smoking 1 ppd - Not  ready quit  Follow up in 4-6 months with Dr. Lynnwood Sauer, FNP-BC 07/26/23

## 2023-07-26 NOTE — Patient Instructions (Signed)
 Medication Changes:  No Changes In Medications at this time.   Lab Work:  Labs done today, your results will be available in MyChart, we will contact you for abnormal readings.  SOMEONE WILL REACH OUT REGARDING SLEEP STUDY   Follow-Up in: 4-6 MONTHS WITH DR. Julane Ny PLEASE CALL OUR OFFICE AROUND JULY TO GET SCHEDULED FOR YOUR APPOINTMENT. PHONE NUMBER IS [phone] OPTION 2    At the [facility], you and your health needs are our priority. We have a designated team specialized in the treatment of Heart Failure. This Care Team includes your primary Heart Failure Specialized Cardiologist (physician), Advanced Practice Providers (APPs- Physician Assistants and Nurse Practitioners), and Pharmacist who all work together to provide you with the care you need, when you need it.   You may see any of the following providers on your designated Care Team at your next follow up:  Dr. Jules Oar Dr. Peder Bourdon Dr. Alwin Baars Dr. Judyth Nunnery Nieves Bars, NP Ruddy Corral, [place] [facility] [place], [place] Dennise Fitz, NP [place] Lee, NP Luster Salters, PharmD   Please be sure to bring in all your medications bottles to every appointment.   Need to Contact Us :  If you have any questions or concerns before your next appointment please send us  a message through [place] or call our office at [phone].    TO LEAVE A MESSAGE FOR THE NURSE SELECT OPTION 2, PLEASE LEAVE A MESSAGE INCLUDING: YOUR NAME DATE OF BIRTH CALL BACK NUMBER REASON FOR CALL**this is important as we prioritize the call backs  YOU WILL RECEIVE A CALL BACK THE SAME DAY AS LONG AS YOU CALL BEFORE 4:00 PM

## 2023-08-01 ENCOUNTER — Telehealth: Payer: Self-pay

## 2023-08-01 ENCOUNTER — Other Ambulatory Visit (HOSPITAL_COMMUNITY): Payer: Self-pay

## 2023-08-01 NOTE — Telephone Encounter (Signed)
**Note De-Identified [place] Obfuscation** Per the [place]  Medicaid Managed Care website: [place]  Medicaid Managed Care does not require prior authorization for CPT Code: 16109 (NPSG).  I have transferred the order to the sleep lab so they can contact the pt to schedule test.

## 2023-08-03 ENCOUNTER — Telehealth (HOSPITAL_COMMUNITY): Payer: Self-pay

## 2023-08-03 NOTE — Addendum Note (Signed)
 Addended by: Edra Govern D on: 08/03/2023 04:20 PM   Modules accepted: Orders

## 2023-08-03 NOTE — Telephone Encounter (Signed)
 Spoke with patient regarding the following results. Patient made aware and patient verbalized understanding.

## 2023-08-03 NOTE — Telephone Encounter (Signed)
-----   Message from Elmarie Hacking sent at 07/26/2023  4:11 PM EDT ----- EF improved to 50-55%, great news!

## 2023-08-03 NOTE — Progress Notes (Signed)
 Remote ICD transmission.

## 2023-08-10 ENCOUNTER — Encounter: Payer: Self-pay | Admitting: Family Medicine

## 2023-08-25 DIAGNOSIS — J438 Other emphysema: Secondary | ICD-10-CM | POA: Diagnosis not present

## 2023-08-27 DEATH — deceased

## 2023-09-25 DIAGNOSIS — J438 Other emphysema: Secondary | ICD-10-CM | POA: Diagnosis not present

## 2023-10-25 DIAGNOSIS — J438 Other emphysema: Secondary | ICD-10-CM | POA: Diagnosis not present

## 2024-06-17 ENCOUNTER — Encounter

## 2024-09-16 ENCOUNTER — Encounter

## 2024-12-16 ENCOUNTER — Encounter

## 2025-03-17 ENCOUNTER — Encounter
